# Patient Record
Sex: Female | Born: 1940 | Race: White | Hispanic: No | Marital: Single | Smoking: Former smoker
Health system: Southern US, Community
[De-identification: ages and names within clinical notes are randomized; demographics above are authoritative.]

## PROBLEM LIST (undated history)

## (undated) DIAGNOSIS — R011 Cardiac murmur, unspecified: Secondary | ICD-10-CM

## (undated) DIAGNOSIS — H269 Unspecified cataract: Secondary | ICD-10-CM

## (undated) DIAGNOSIS — D649 Anemia, unspecified: Secondary | ICD-10-CM

## (undated) DIAGNOSIS — M1990 Unspecified osteoarthritis, unspecified site: Secondary | ICD-10-CM

## (undated) DIAGNOSIS — R9431 Abnormal electrocardiogram [ECG] [EKG]: Secondary | ICD-10-CM

## (undated) DIAGNOSIS — I059 Rheumatic mitral valve disease, unspecified: Secondary | ICD-10-CM

## (undated) DIAGNOSIS — R5383 Other fatigue: Secondary | ICD-10-CM

## (undated) DIAGNOSIS — E559 Vitamin D deficiency, unspecified: Secondary | ICD-10-CM

## (undated) DIAGNOSIS — C50919 Malignant neoplasm of unspecified site of unspecified female breast: Secondary | ICD-10-CM

## (undated) HISTORY — DX: Unspecified osteoarthritis, unspecified site: M19.90

## (undated) HISTORY — DX: Cardiac murmur, unspecified: R01.1

## (undated) HISTORY — PX: CARDIAC VALVE REPLACEMENT: SHX585

## (undated) HISTORY — DX: Malignant neoplasm of unspecified site of unspecified female breast: C50.919

## (undated) HISTORY — DX: Unspecified cataract: H26.9

## (undated) HISTORY — DX: Anemia, unspecified: D64.9

## (undated) HISTORY — PX: MASTECTOMY: SHX3

## (undated) HISTORY — DX: Vitamin D deficiency, unspecified: E55.9

## (undated) HISTORY — PX: TUBAL LIGATION: SHX77

## (undated) HISTORY — DX: Other fatigue: R53.83

## (undated) HISTORY — PX: BREAST SURGERY: SHX581

## (undated) HISTORY — DX: Abnormal electrocardiogram (ECG) (EKG): R94.31

## (undated) HISTORY — DX: Rheumatic mitral valve disease, unspecified: I05.9

---

## 1964-05-21 HISTORY — PX: APPENDECTOMY: SHX54

## 1973-05-21 HISTORY — PX: VARICOSE VEIN SURGERY: SHX832

## 2009-01-19 ENCOUNTER — Ambulatory Visit (HOSPITAL_COMMUNITY): Admission: RE | Admit: 2009-01-19 | Discharge: 2009-01-19 | Payer: Self-pay | Admitting: Internal Medicine

## 2009-01-19 ENCOUNTER — Other Ambulatory Visit: Admission: RE | Admit: 2009-01-19 | Discharge: 2009-01-19 | Payer: Self-pay | Admitting: Internal Medicine

## 2009-02-24 ENCOUNTER — Ambulatory Visit: Payer: Self-pay | Admitting: Gastroenterology

## 2009-09-05 ENCOUNTER — Encounter (INDEPENDENT_AMBULATORY_CARE_PROVIDER_SITE_OTHER): Payer: Self-pay | Admitting: *Deleted

## 2009-09-23 ENCOUNTER — Encounter (INDEPENDENT_AMBULATORY_CARE_PROVIDER_SITE_OTHER): Payer: Self-pay | Admitting: *Deleted

## 2009-09-26 ENCOUNTER — Encounter: Payer: Self-pay | Admitting: Cardiology

## 2009-09-27 ENCOUNTER — Ambulatory Visit: Payer: Self-pay | Admitting: Gastroenterology

## 2009-10-07 ENCOUNTER — Ambulatory Visit (HOSPITAL_COMMUNITY): Admission: RE | Admit: 2009-10-07 | Discharge: 2009-10-07 | Payer: Self-pay | Admitting: Internal Medicine

## 2009-10-11 ENCOUNTER — Ambulatory Visit: Payer: Self-pay | Admitting: Cardiology

## 2009-10-11 DIAGNOSIS — I340 Nonrheumatic mitral (valve) insufficiency: Secondary | ICD-10-CM | POA: Insufficient documentation

## 2009-10-11 DIAGNOSIS — I059 Rheumatic mitral valve disease, unspecified: Secondary | ICD-10-CM

## 2009-10-12 ENCOUNTER — Encounter: Payer: Self-pay | Admitting: Cardiology

## 2009-10-12 ENCOUNTER — Ambulatory Visit: Payer: Self-pay

## 2009-10-12 ENCOUNTER — Ambulatory Visit (HOSPITAL_COMMUNITY): Admission: RE | Admit: 2009-10-12 | Discharge: 2009-10-12 | Payer: Self-pay | Admitting: Cardiology

## 2009-10-12 ENCOUNTER — Ambulatory Visit: Payer: Self-pay | Admitting: Internal Medicine

## 2009-12-05 ENCOUNTER — Ambulatory Visit (HOSPITAL_COMMUNITY): Admission: RE | Admit: 2009-12-05 | Discharge: 2009-12-05 | Payer: Self-pay | Admitting: Internal Medicine

## 2010-06-11 ENCOUNTER — Encounter: Payer: Self-pay | Admitting: Cardiology

## 2010-06-20 NOTE — Letter (Signed)
Summary: [facility] Instructions  [facility]  [address] [place], [place] [address]   Phone: [phone]  Fax: [phone]       Helen Davis    1940/10/16    MRN: [id number]        Procedure Day Dorna Bloom: [facility]  10/05/09     Arrival Time:  9:00am     Procedure Time: 10:00am     Location of Procedure:                    Juliann Pares _  [facility] (4th Floor)                        PREPARATION FOR COLONOSCOPY WITH MOVIPREP   Starting 5 days prior to your procedure  FRIDAY 05/13  do not eat nuts, seeds, popcorn, corn, beans, peas,  salads, or any raw vegetables.  Do not take any fiber supplements (e.g. Metamucil, Citrucel, and Benefiber).  THE DAY BEFORE YOUR PROCEDURE         DATE: 05/17   DAY: TUESDAY  1.  Drink clear liquids the entire day-NO SOLID FOOD  2.  Do not drink anything colored red or purple.  Avoid juices with pulp.  No orange juice.  3.  Drink at least 64 oz. (8 glasses) of fluid/clear liquids during the day to prevent dehydration and help the prep work efficiently.  CLEAR LIQUIDS INCLUDE: Water Jello Ice Popsicles Tea (sugar ok, no milk/cream) Powdered fruit flavored drinks Coffee (sugar ok, no milk/cream) Gatorade Juice: apple, white grape, white cranberry  Lemonade Clear bullion, consomm, broth Carbonated beverages (any kind) Strained chicken noodle soup Hard Candy                             4.  In the morning, mix first dose of MoviPrep solution:    Empty 1 Pouch A and 1 Pouch B into the disposable container    Add lukewarm drinking water to the top line of the container. Mix to dissolve    Refrigerate (mixed solution should be used within 24 hrs)  5.  Begin drinking the prep at 5:00 p.m. The MoviPrep container is divided by 4 marks.   Every 15 minutes drink the solution down to the next mark (approximately 8 oz) until the full liter is complete.   6.  Follow completed prep with 16 oz of clear liquid of your  choice (Nothing red or purple).  Continue to drink clear liquids until bedtime.  7.  Before going to bed, mix second dose of MoviPrep solution:    Empty 1 Pouch A and 1 Pouch B into the disposable container    Add lukewarm drinking water to the top line of the container. Mix to dissolve    Refrigerate  THE DAY OF YOUR PROCEDURE      DATE: 05/18  DAY: WEDNESDAY  Beginning at 5:00 a.m. (5 hours before procedure):         1. Every 15 minutes, drink the solution down to the next mark (approx 8 oz) until the full liter is complete.  2. Follow completed prep with 16 oz. of clear liquid of your choice.    3. You may drink clear liquids until 8:00am  (2 HOURS BEFORE PROCEDURE).   MEDICATION INSTRUCTIONS  Unless otherwise instructed, you should take regular prescription medications with a small sip of water   as early  as possible the morning of your procedure.      OTHER INSTRUCTIONS  You will need a responsible adult at least 68 years of age to accompany you and drive you home.   This person must remain in the waiting room during your procedure.  Wear loose fitting clothing that is easily removed.  Leave jewelry and other valuables at home.  However, you may wish to bring a book to read or  an iPod/MP3 player to listen to music as you wait for your procedure to start.  Remove all body piercing jewelry and leave at home.  Total time from sign-in until discharge is approximately 2-3 hours.  You should go home directly after your procedure and rest.  You can resume normal activities the  day after your procedure.  The day of your procedure you should not:   Drive   Make legal decisions   Operate machinery   Drink alcohol   Return to work  You will receive specific instructions about eating, activities and medications before you leave.    The above instructions have been reviewed and explained to me by   Wyona Almas RN  Sep 27, 2009 11:07 AM     I fully  understand and can verbalize these instructions _____________________________ Date _________

## 2010-06-20 NOTE — Assessment & Plan Note (Signed)
Summary: np6/clearance for colonoscopy/jml   Primary Provider:  Dr. Elisabeth Most  CC:  new patient.  Clearance for colonoscopy.  Abnormal EKG per Dr. Rock Nephew office.  Marland Kitchen  History of Present Illness: 70 yo with history of mitral valve prolapse and arthritis presents for evaluation of abnormal ECG and murmur.  Patient was noted on recent visit with her PCP to have some abnormalities on her ECG.  Additionally, she has a history of mitral valve prolapse (diagnosed when she lived in [place]).  She actually is doing quite well from a cardiopulmonary standpoint.  She had one episode of chest pain back in February.  This occurred after she had been moving furniture around her house all day.  She woke up that night with severe aching across her chest.  She eventually fell back asleep and the pain was only mild when she woke up in the morning.  No exertional chest pain.  No pain since that time.  Patient walks a lot for exercise.  She goes to the [facility] and uses the treadmill and exercise bike.  She lifts weight.  No exertional dyspnea except when she carries her grand-daughter up a flight of stairs.  She is supposed to have a colonoscopy this summer and wants to make sure that this will be safe from a cardiac perspective.    ECG: NSR, nonspecific inferior and lateral T wave flattening, inverted T in V6  Labs (5/11): HCT 36.6, K 4.2, creatinine 0.7, HDL 109, LDL 75, TSH normal   Current Medications (verified): 1)  Moviprep 100 Gm  Solr (Peg-Kcl-Nacl-Nasulf-Na Asc-C) .... As Per Prep Instructions. 2)  Advil 200 Mg Tabs (Ibuprofen) .... As Needed For Arthritis  Allergies (verified): No Known Drug Allergies  Past History:  Past Medical History: 1. MVP: She was told she had MVP with some mitral regurgitation when she lived in [place].  2. Breast cancer: Status post chemotherapy and radiation as well as bilateral mastectomies in 1999.  3. Arthritis: Some features of rheumatoid arthritis but has not  been definitively diagnosed.   Past Surgical History: bilateral mastectomy-1999 tonsillectomy appendectomy  Family History: Sister with mitral valve prolapse, required mitral valve repair. Mother with CVA, CHF, atrial fibrillation.   Social History: Lives alone, originally from [place].  2 sons live in [place].  Retired Child psychotherapist.  Drinks about 2 glasses of wine a night.  No smoking.   Review of Systems       All systems reviewed and negative except as per HPI.   Vital Signs:  Patient profile:   70 year old female Height:      64.5 inches Weight:      108 pounds BMI:     18.32 Pulse rate:   75 / minute Pulse rhythm:   regular BP sitting:   132 / 60  (left arm) Cuff size:   regular  Vitals Entered By: Judithe Modest CMA (Oct 11, 2009 12:01 PM)  Physical Exam  General:  Well developed, well nourished, in no acute distress. Thin.  Head:  normocephalic and atraumatic Nose:  no deformity, discharge, inflammation, or lesions Mouth:  Teeth, gums and palate normal. Oral mucosa normal. Neck:  Neck supple, no JVD. No masses, thyromegaly or abnormal cervical nodes. Lungs:  Clear bilaterally to auscultation and percussion. Heart:  Non-displaced PMI, chest non-tender; regular rate and rhythm, S1, S2 without rubs or gallops. 3/6 mid to late systolic murmur at the apex.  Carotid upstroke normal, no bruit. Pedals normal pulses. No edema, no varicosities.  Abdomen:  Bowel sounds positive; abdomen soft and non-tender without masses, organomegaly, or hernias noted. No hepatosplenomegaly. Msk:  Ulnar deviation of the fingers bilaterally.  Extremities:  No clubbing or cyanosis. Neurologic:  Alert and oriented x 3. Skin:  Intact without lesions or rashes. Psych:  Normal affect.   Impression & Recommendations:  Problem # 1:  MITRAL VALVE DISORDERS (ICD-424.0) Patient has a mid to late systolic murmur consistent with mitral valve prolapse with mitral regurgitation.  Will get an  echocardiogram to determine severity of the regurgitation.  Patient has no significant cardiopulmonary symptoms.   Problem # 2:  ABNORMAL EKG (ICD-794.31) Nonspecific T wave abnormalities on ECG.  Given patient's lack of significant symptoms, I do not think that a stress test would be indicated here.  I think that she should be able to safely undergo colonoscopy.    Other Orders: Echocardiogram (Echo)  Patient Instructions: 1)  Your physician has requested that you have an echocardiogram.  Echocardiography is a painless test that uses sound waves to create images of your heart. It provides your doctor with information about the size and shape of your heart and how well your heart's chambers and valves are working.  This procedure takes approximately one hour. There are no restrictions for this procedure. 2)  Your physician recommends that you schedule a follow-up appointment as needed with Dr Shirlee Latch.

## 2010-06-20 NOTE — Miscellaneous (Signed)
Summary: [facility] Previsit/prep  Clinical Lists Changes  Medications: Added new medication of MOVIPREP 100 GM  SOLR (PEG-KCL-NACL-NASULF-NA ASC-C) As per prep instructions. - Signed Rx of MOVIPREP 100 GM  SOLR (PEG-KCL-NACL-NASULF-NA ASC-C) As per prep instructions.;  #1 x 0;  Signed;  Entered by: Wyona Almas RN;  Authorized by: Rachael Fee MD;  Method used: Electronically to [facility] [address], [place], [place], [place]  [address], Ph: [phone], Fax: [phone] Observations: Added new observation of NKA: T (09/27/2009 10:25)    Prescriptions: MOVIPREP 100 GM  SOLR (PEG-KCL-NACL-NASULF-NA ASC-C) As per prep instructions.  #1 x 0   Entered by:   Wyona Almas RN   Authorized by:   Rachael Fee MD   Signed by:   Wyona Almas RN on 09/27/2009   Method used:   Electronically to        [facility] [address] (retail)       [address]       [place], [place]  [address]       Ph: [phone]       Fax: [phone]   RxID:   [id number]   Appended Document: [facility] Previsit/prep Pt. states she had an EKG yesterday at her PCP and Dr. Elisabeth Most thought she may have had an MI and has referred her to a cardiologist for evaluation.  I advised Ms. Lundstrom that she needed  have the cardiac workup prior to her colonoscopy.  Pt. verbalized information.

## 2010-06-20 NOTE — Letter (Signed)
Summary: Previsit letter  [facility]  [address] [place], [place] [address]   Phone: [phone]  Fax: [phone]       09/05/2009 MRN: [id number]  [facility] [address] [place], [place]  [address]  Dear Helen Davis,  Welcome to the Gastroenterology Division at [facility].    You are scheduled to see a nurse for your pre-procedure visit on 09-27-09 at 10:30a.m. on the 3rd floor at [facility], [address]  We ask that you try to arrive at our office 15 minutes prior to your appointment time to allow for check-in.  Your nurse visit will consist of discussing your medical and surgical history, your immediate family medical history, and your medications.    Please bring a complete list of all your medications or, if you prefer, bring the medication bottles and we will list them.  We will need to be aware of both prescribed and over the counter drugs.  We will need to know exact dosage information as well.  If you are on blood thinners (Coumadin, Plavix, Aggrenox, Ticlid, etc.) please call our office today/prior to your appointment, as we need to consult with your physician about holding your medication.   Please be prepared to read and sign documents such as consent forms, a financial agreement, and acknowledgement forms.  If necessary, and with your consent, a friend or relative is welcome to sit-in on the nurse visit with you.  Please bring your insurance card so that we may make a copy of it.  If your insurance requires a referral to see a specialist, please bring your referral form from your primary care physician.  No co-pay is required for this nurse visit.     If you cannot keep your appointment, please call [phone] to cancel or reschedule prior to your appointment date.  This allows [place] the opportunity to schedule an appointment for another patient in need of care.    Thank you for choosing [facility] for your  medical needs.  We appreciate the opportunity to care for you.  Please visit [place] at our website  to learn more about our practice.                     Sincerely.                                                                                                                   The Gastroenterology Division

## 2011-10-18 ENCOUNTER — Other Ambulatory Visit (HOSPITAL_COMMUNITY)
Admission: RE | Admit: 2011-10-18 | Discharge: 2011-10-18 | Disposition: A | Discharge status: Home / Self Care | Payer: Medicare Other | Source: Ambulatory Visit | Attending: Internal Medicine | Admitting: Internal Medicine

## 2011-10-18 DIAGNOSIS — Z124 Encounter for screening for malignant neoplasm of cervix: Secondary | ICD-10-CM | POA: Insufficient documentation

## 2011-12-13 ENCOUNTER — Ambulatory Visit (HOSPITAL_COMMUNITY)
Admission: RE | Admit: 2011-12-13 | Discharge: 2011-12-13 | Disposition: A | Discharge status: Home / Self Care | Payer: Medicare Other | Source: Ambulatory Visit | Attending: Emergency Medicine | Admitting: Emergency Medicine

## 2011-12-13 ENCOUNTER — Other Ambulatory Visit (HOSPITAL_COMMUNITY): Payer: Self-pay | Admitting: Emergency Medicine

## 2011-12-13 DIAGNOSIS — J449 Chronic obstructive pulmonary disease, unspecified: Secondary | ICD-10-CM

## 2011-12-13 DIAGNOSIS — J4489 Other specified chronic obstructive pulmonary disease: Secondary | ICD-10-CM | POA: Insufficient documentation

## 2011-12-13 DIAGNOSIS — Z0000 Encounter for general adult medical examination without abnormal findings: Secondary | ICD-10-CM | POA: Insufficient documentation

## 2012-12-18 ENCOUNTER — Encounter: Payer: Self-pay | Admitting: Cardiology

## 2012-12-18 ENCOUNTER — Encounter: Payer: Self-pay | Admitting: *Deleted

## 2012-12-22 ENCOUNTER — Encounter: Payer: Self-pay | Admitting: *Deleted

## 2012-12-22 ENCOUNTER — Encounter: Payer: Medicare Other | Admitting: Cardiology

## 2012-12-22 DIAGNOSIS — D649 Anemia, unspecified: Secondary | ICD-10-CM | POA: Insufficient documentation

## 2012-12-22 DIAGNOSIS — E559 Vitamin D deficiency, unspecified: Secondary | ICD-10-CM | POA: Insufficient documentation

## 2012-12-22 DIAGNOSIS — Z853 Personal history of malignant neoplasm of breast: Secondary | ICD-10-CM | POA: Insufficient documentation

## 2012-12-22 NOTE — Progress Notes (Signed)
  HPI: 72 year old female for evaluation of mitral valve prolapse and mitral regurgitation. Patient previously seen by Dr. Shirlee Latch in May of 2011. Echocardiogram in May of 2011 revealed normal LV function, mitral valve prolapse and moderate mitral regurgitation.  No current outpatient prescriptions on file.   No current facility-administered medications for this visit.    Allergies not on file  Past Medical History  Diagnosis Date  . Mitral valve disorders   . ABNORMAL EKG     No past surgical history on file.  History   Social History  . Marital Status: Single    Spouse Name: N/A    Number of Children: N/A  . Years of Education: N/A   Occupational History  . Not on file.   Social History Main Topics  . Smoking status: Not on file  . Smokeless tobacco: Not on file  . Alcohol Use: Not on file  . Drug Use: Not on file  . Sexually Active: Not on file   Other Topics Concern  . Not on file   Social History Narrative  . No narrative on file    No family history on file.  ROS: no fevers or chills, productive cough, hemoptysis, dysphasia, odynophagia, melena, hematochezia, dysuria, hematuria, rash, seizure activity, orthopnea, PND, pedal edema, claudication. Remaining systems are negative.  Physical Exam:   There were no vitals taken for this visit.  General:  Well developed/well nourished in NAD Skin warm/dry Patient not depressed No peripheral clubbing Back-normal HEENT-normal/normal eyelids Neck supple/normal carotid upstroke bilaterally; no bruits; no JVD; no thyromegaly chest - CTA/ normal expansion CV - RRR/normal S1 and S2; no murmurs, rubs or gallops;  PMI nondisplaced Abdomen -NT/ND, no HSM, no mass, + bowel sounds, no bruit 2+ femoral pulses, no bruits Ext-no edema, chords, 2+ DP Neuro-grossly nonfocal  ECG    This encounter was created in error - please disregard.

## 2013-06-02 ENCOUNTER — Encounter: Payer: Self-pay | Admitting: Physician Assistant

## 2013-06-02 ENCOUNTER — Ambulatory Visit (INDEPENDENT_AMBULATORY_CARE_PROVIDER_SITE_OTHER): Payer: Medicare Other | Admitting: Physician Assistant

## 2013-06-02 VITALS — BP 132/72 | HR 76 | Temp 98.2°F | Resp 16 | Ht 63.0 in | Wt 103.0 lb

## 2013-06-02 DIAGNOSIS — J019 Acute sinusitis, unspecified: Secondary | ICD-10-CM

## 2013-06-02 MED ORDER — PREDNISONE 20 MG PO TABS: ORAL_TABLET | ORAL | Status: DC

## 2013-06-02 MED ORDER — AZITHROMYCIN 250 MG PO TABS: ORAL_TABLET | ORAL | Status: DC

## 2013-06-02 NOTE — Patient Instructions (Addendum)

## 2013-06-02 NOTE — Progress Notes (Addendum)
   Subjective:    Patient ID: Helen Davis, female    DOB: 07-30-1940, 73 y.o.   MRN: [id number]  Sinus Problem This is a new problem. The current episode started in the past 7 days. The problem is unchanged. There has been no fever. Associated symptoms include chills, congestion, coughing, headaches, sinus pressure and sneezing. Pertinent negatives include no diaphoresis, ear pain, hoarse voice, neck pain, shortness of breath, sore throat or swollen glands. Past treatments include nothing.    Review of Systems  Constitutional: Positive for chills. Negative for diaphoresis.  HENT: Positive for congestion, sinus pressure and sneezing. Negative for ear pain, hoarse voice and sore throat.   Respiratory: Positive for cough. Negative for shortness of breath.   Gastrointestinal: Negative.   Musculoskeletal: Negative for neck pain.  Neurological: Positive for headaches.       Objective:   Physical Exam  Constitutional: She appears well-developed and well-nourished.  HENT:  Head: Normocephalic and atraumatic.  Right Ear: External ear normal.  Nose: Right sinus exhibits frontal sinus tenderness. Left sinus exhibits frontal sinus tenderness.  Eyes: Conjunctivae and EOM are normal.  Neck: Normal range of motion. Neck supple.  Cardiovascular: Normal rate, regular rhythm, normal heart sounds and intact distal pulses.   Pulmonary/Chest: Effort normal and breath sounds normal. No respiratory distress. She has no wheezes.  Abdominal: Soft. Bowel sounds are normal.  Lymphadenopathy:    She has cervical adenopathy.  Skin: Skin is warm and dry.      Assessment & Plan:  Acute sinusitis, unspecified - Plan: azithromycin (ZITHROMAX) 250 MG tablet, predniSONE (DELTASONE) 20 MG tablet

## 2013-11-12 ENCOUNTER — Encounter: Payer: Self-pay | Admitting: Emergency Medicine

## 2013-11-12 ENCOUNTER — Ambulatory Visit (INDEPENDENT_AMBULATORY_CARE_PROVIDER_SITE_OTHER): Payer: Medicare Other | Admitting: Emergency Medicine

## 2013-11-12 ENCOUNTER — Other Ambulatory Visit: Payer: Self-pay | Admitting: Emergency Medicine

## 2013-11-12 VITALS — BP 114/68 | HR 68 | Temp 98.4°F | Resp 18 | Ht 63.75 in | Wt 100.0 lb

## 2013-11-12 DIAGNOSIS — Z789 Other specified health status: Secondary | ICD-10-CM

## 2013-11-12 DIAGNOSIS — Z0000 Encounter for general adult medical examination without abnormal findings: Secondary | ICD-10-CM

## 2013-11-12 DIAGNOSIS — E782 Mixed hyperlipidemia: Secondary | ICD-10-CM

## 2013-11-12 DIAGNOSIS — Z1331 Encounter for screening for depression: Secondary | ICD-10-CM

## 2013-11-12 DIAGNOSIS — R5383 Other fatigue: Secondary | ICD-10-CM

## 2013-11-12 DIAGNOSIS — R5381 Other malaise: Secondary | ICD-10-CM

## 2013-11-12 DIAGNOSIS — I059 Rheumatic mitral valve disease, unspecified: Secondary | ICD-10-CM

## 2013-11-12 DIAGNOSIS — E559 Vitamin D deficiency, unspecified: Secondary | ICD-10-CM

## 2013-11-12 LAB — BASIC METABOLIC PANEL WITH GFR
BUN: 9 mg/dL (ref 6–23)
CALCIUM: 9.1 mg/dL (ref 8.4–10.5)
CO2: 27 meq/L (ref 19–32)
Chloride: 96 mEq/L (ref 96–112)
Creat: 0.59 mg/dL (ref 0.50–1.10)
GFR, Est African American: >89 mL/min
Glucose, Bld: 85 mg/dL (ref 70–99)
POTASSIUM: 3.9 meq/L (ref 3.5–5.3)
Sodium: 130 mEq/L — ABNORMAL LOW (ref 135–145)

## 2013-11-12 LAB — CBC WITH DIFFERENTIAL/PLATELET
BASOS ABS: 0.1 10*3/uL (ref 0.0–0.1)
Basophils Relative: 2 % — ABNORMAL HIGH (ref 0–1)
EOS ABS: 0.1 10*3/uL (ref 0.0–0.7)
Eosinophils Relative: 3 % (ref 0–5)
HEMATOCRIT: 33.4 % — AB (ref 36.0–46.0)
HEMOGLOBIN: 11.2 g/dL — AB (ref 12.0–15.0)
LYMPHS PCT: 30 % (ref 12–46)
Lymphs Abs: 1.4 10*3/uL (ref 0.7–4.0)
MCH: 30.7 pg (ref 26.0–34.0)
MCHC: 33.5 g/dL (ref 30.0–36.0)
MCV: 91.5 fL (ref 78.0–100.0)
MONOS PCT: 10 % (ref 3–12)
Monocytes Absolute: 0.5 10*3/uL (ref 0.1–1.0)
NEUTROS ABS: 2.5 10*3/uL (ref 1.7–7.7)
Neutrophils Relative %: 55 % (ref 43–77)
PLATELETS: 274 10*3/uL (ref 150–400)
RBC: 3.65 MIL/uL — ABNORMAL LOW (ref 3.87–5.11)
RDW: 12.9 % (ref 11.5–15.5)
WBC: 4.6 10*3/uL (ref 4.0–10.5)

## 2013-11-12 LAB — LIPID PANEL
Cholesterol: 208 mg/dL — ABNORMAL HIGH (ref 0–200)
HDL: 100 mg/dL (ref >39)
LDL CALC: 92 mg/dL (ref 0–99)
Total CHOL/HDL Ratio: 2.1 Ratio
Triglycerides: 82 mg/dL (ref <150)
VLDL: 16 mg/dL (ref 0–40)

## 2013-11-12 LAB — HEPATIC FUNCTION PANEL
ALK PHOS: 59 U/L (ref 39–117)
ALT: 14 U/L (ref 0–35)
AST: 17 U/L (ref 0–37)
Albumin: 4.3 g/dL (ref 3.5–5.2)
Bilirubin, Direct: 0.1 mg/dL (ref 0.0–0.3)
Indirect Bilirubin: 0.6 mg/dL (ref 0.2–1.2)
TOTAL PROTEIN: 6.2 g/dL (ref 6.0–8.3)
Total Bilirubin: 0.7 mg/dL (ref 0.2–1.2)

## 2013-11-12 LAB — MAGNESIUM: MAGNESIUM: 1.9 mg/dL (ref 1.5–2.5)

## 2013-11-12 LAB — HEMOGLOBIN A1C
HEMOGLOBIN A1C: 5.2 % (ref <5.7)
Mean Plasma Glucose: 103 mg/dL (ref <117)

## 2013-11-12 NOTE — Patient Instructions (Signed)
Cholesterol Cholesterol is a protein. Your body needs a small amount of cholesterol. Cholesterol may build up in your blood vessels. This increases your chance of having a heart attack or stroke. You cannot feel your cholesterol levels. The only way to know your cholesterol level is high is with a blood test. Keep your test results. Work with your doctor to keep your cholesterol at a good level. WHAT DO THE TEST RESULTS MEAN?  Total cholesterol is how much cholesterol is in your blood.  LDL is bad cholesterol. This is the type that can build up. You want LDL to be low.  HDL is good cholesterol. It cleans your blood vessels and carries LDL away. You want HDL to be high.  Triglycerides are fat that the body can burn for energy or store. WHAT ARE GOOD LEVELS OF CHOLESTEROL?  Total cholesterol below 200.  LDL below 100 for people at risk. Below 70 for those at very high risk.  HDL above 50 is good. Above 60 is best.  Triglycerides below 150. HOW CAN I LOWER MY CHOLESTEROL?  Diet. Follow your diet programs as told by your doctor.  Choose fish, white meat chicken, roasted [place], or baked [place]. Try not to eat red meat, fried foods, or processed meats such as sausage and lunch meats.  Eat lots of fresh fruits and vegetables.  Choose whole grains, beans, pasta, potatoes, and cereals.  Use only small amounts of olive, corn, or canola oils.  Try not to eat butter, mayonnaise, shortening, or palm kernel oils.  Try not to eat foods with trans fats.  Drink skim or nonfat milk. Eat low-fat or nonfat yogurt and cheeses. Try not to drink whole milk or cream. Try not to eat ice cream, egg yolks, and full-fat cheeses.  Healthy desserts include angel food cake, ginger snaps, animal crackers, hard candy, popsicles, and low-fat or nonfat frozen yogurt. Try not to eat pastries, cakes, pies, and cookies.  Exercise. Follow your exercise programs as told by your doctor.  Be more active. You can  try gardening, walking, or taking the stairs. Ask your doctor about how you can be more active.  Medicine. Take medicine as told by your doctor. Document Released: 08/03/2008 Document Revised: 05/12/2013 Document Reviewed: 02/18/2013 [facility] Patient Information 2015 [place], [place]. This information is not intended to replace advice given to you by your health care Srishti Strnad. Make sure you discuss any questions you have with your health care Jadee Golebiewski.

## 2013-11-12 NOTE — Progress Notes (Signed)
Patient ID: Helen Davis, female   DOB: [birth year], 73 y.o.   MRN: [id number] MEDICARE ANNUAL WELLNESS VISIT AND CPE  Assessment:  1. CPE/ medicare wellness update- Update screening labs/ History/ Immunizations/ Testing as needed. Advised healthy diet, QD exercise, increase H20 and continue RX/ Vitamins AD.  2. Fatigue- check labs, increase activity and H2O   3. Cholesterol (diet controlled)/ Vit d.def- recheck labs, Need to eat healthier and exercise AD.   4. Callus/ Bunion/ HAMMER TOESS- Advised needs podiatry evaluation, epsom salt soaks, vaseline treatment QD, monitor QD and call with any concerns/ adverse changes  Plan:   During the course of the visit the patient was educated and counseled about appropriate screening and preventive services including:    Pneumococcal vaccine   Influenza vaccine  Td vaccine  Screening electrocardiogram  Screening mammography  Bone densitometry screening  Colorectal cancer screening  Diabetes screening  Glaucoma screening  Nutrition counseling   Advanced directives: given information/requested  Screening recommendations, referrals:  Vaccinations: Tdap vaccine not indicated Influenza vaccine declined Pneumococcal vaccine declined Shingles vaccine declined Hep B vaccine declined  Nutrition assessed and recommended  Colonoscopy not indicated Mammogram declined Pap smear not indicated Pelvic exam not indicated Recommended yearly ophthalmology/optometry visit for glaucoma screening and checkup Recommended yearly dental visit for hygiene and checkup Advanced directives - not indicated  Conditions/risks identified: BMI: Discussed weight loss, diet, and increase physical activity.  Increase physical activity: AHA recommends 150 minutes of physical activity a week.  Medications reviewed DEXA- declined Diabetes at goal, ACE/ARB therapy No, Reason not on Ace Inhibitor/ARB therapy:  NOT diabetic Urinary Incontinence is not an  issue: discussed non pharmacology and pharmacology options.  Fall risk: low- discussed PT, home fall assessment, medications.   Subjective:   Helen Davis is a 73 y.o. female who presents for Medicare Annual Wellness Visit and complete physical.    Date of last medicare wellness visit is unknown.  She did not f/u AD for low sodium 10/28/12 130 and chloride 94.   Her blood pressure has been controlled at home, today their BP is BP: 114/68 mmHg She does workout. She denies chest pain, shortness of breath, dizziness.  She is not on cholesterol medication and denies myalgias. Her cholesterol is at goal. The cholesterol last visit was:   Lab Results  Component Value Date   CHOL 208* 11/12/2013  T 214 TG 74 H 119 L80  She has been working on diet and exercise TO PREVENT prediabetes, and denies foot ulcerations, paresthesia of the feet and polyuria. Last A1C in the office was: 5.1 Lab Results  Component Value Date   HGBA1C 5.2 11/12/2013   Patient is on Vitamin D supplement.     Names of Other Physician/Practitioners you currently use: Patient Care Team: Unk Pinto, MD as PCP - General (Internal Medicine) Darleen Crocker, MD as Consulting Physician (Ophthalmology) Mayme Genta, MD as Consulting Physician (Gastroenterology) Simona Huh, MD as Consulting Physician (Dermatology) Garald Balding, MD as Consulting Physician (Orthopedic Surgery) Thyra Breed, MD as Consulting Physician (Rheumatology) Victorino Dike, DDS as Consulting Physician (Dentistry) Marigene Ehlers, (Cardio)   Medication Review Current Outpatient Prescriptions on File Prior to Visit  Medication Sig Dispense Refill  . Ascorbic Acid (VITAMIN C) 1000 MG tablet Take 1,000 mg by mouth daily.      . Cholecalciferol (VITAMIN D PO) Take 2,000 Int'l Units by mouth daily.      Marland Kitchen CRANBERRY PO Take 450 mg by mouth daily.       Marland Kitchen  Cyanocobalamin (VITAMIN B12 PO) Take by mouth daily.      Marland Kitchen MAGNESIUM  PO Take 250 mg by mouth daily.       . Multiple Vitamins-Minerals (ZINC PO) Take by mouth daily.       No current facility-administered medications on file prior to visit.    Current Problems (verified) Patient Active Problem List   Diagnosis Date Noted  . Anemia   . Breast cancer   . Vitamin D deficiency   . Fatigue   . MITRAL VALVE DISORDERS 10/11/2009  . ABNORMAL EKG 10/11/2009    Screening Tests Health Maintenance  Topic Date Due  . Tetanus/tdap  02/12/1962  . Mammogram  02/12/1993  . Colonoscopy  02/12/1993  . Zostavax  02/13/2003  . Pneumococcal Polysaccharide Vaccine Age 37 And Over  02/13/2008  . Influenza Vaccine  12/19/2013     There is no immunization history on file for this patient.  Preventative care: Last colonoscopy: 2013 Last mammogram: 1999 declines further Last pap smear/pelvic exam: 2013 declines 2016 DEXA:2013 osteoporosis, Fosamax in past declines repeat eval EYE: 03/2013 stable Dentist: Q 6 month  Prior vaccinations: TD or Tdap: 2011  Influenza: declines Pneumococcal: declines Shingles/Zostavax: declines  Past Medical History  Diagnosis Date  . Mitral valve disorders   . ABNORMAL EKG   . Anemia   . Breast cancer   . Vitamin D deficiency   . Fatigue    Past Surgical History  Procedure Laterality Date  . Mastectomy     History  Substance Use Topics  . Smoking status: Former Smoker    Quit date: 05/21/1972  . Smokeless tobacco: Not on file  . Alcohol Use: Not on file   Family History  Problem Relation Age of Onset  . Hypertension    . Stroke    . Cancer      Risk Factors: Osteoporosis: postmenopausal estrogen deficiency History of fracture in the past year: no  Tobacco History  Substance Use Topics  . Smoking status: Former Smoker    Quit date: 05/21/1972  . Smokeless tobacco: Not on file  . Alcohol Use: Not on file   She does not smoke.  Patient is a former smoker. Are there smokers in your home (other than  you)?  No  Alcohol Current alcohol use: glass of wine with dinner  Caffeine Current caffeine use: coffee 4 /day  Exercise  Current exercise: gardening, housecleaning, walking and yard work  Nutrition/Diet Current diet: in general, a "healthy" diet    Cardiac risk factors: advanced age (older than 10 for men, 62 for women).  Depression Screen (Note: if answer to either of the following is "Yes", a more complete depression screening is indicated)   Q1: Over the past two weeks, have you felt down, depressed or hopeless? No  Q2: Over the past two weeks, have you felt little interest or pleasure in doing things? No  Have you lost interest or pleasure in daily life? No  Do you often feel hopeless? No  Do you cry easily over simple problems? No  Activities of Daily Living In your present state of health, do you have any difficulty performing the following activities?:  Driving? No Managing money?  No Feeding yourself? No Getting from bed to chair? No Climbing a flight of stairs? No Preparing food and eating?: No Bathing or showering? No Getting dressed: No Getting to the toilet? No Using the toilet:No Moving around from place to place: No In the past year have you  fallen or had a near fall?:No   Are you sexually active?  No  Do you have more than one partner?  No  Vision Difficulties: No  Hearing Difficulties: No Do you often ask people to speak up or repeat themselves? No Do you experience ringing or noises in your ears? No Do you have difficulty understanding soft or whispered voices? No  Cognition  Do you feel that you have a problem with memory?No  Do you often misplace items? No  Do you feel safe at home?  Yes  Advanced directives Does patient have a [place]? Yes, SON- Adrienne Mocha Does patient have a Living Will? Yes   Objective:     Blood pressure 114/68, pulse 68, temperature 98.4 F (36.9 C), temperature source Temporal, resp.  rate 18, height 5' 3.75" (1.619 m), weight 100 lb (45.36 kg). Body mass index is 17.31 kg/(m^2).  General appearance: alert, no distress, WD/WN,  female Cognitive Testing  Alert? Yes  Normal Appearance?Yes  Oriented to person? No  Place? No   Time? Yes  Recall of three objects?  Yes  Can perform simple calculations? Yes  Displays appropriate judgment?Yes  Can read the correct time from a watch face?Yes  HEENT: normocephalic, sclerae anicteric, TMs pearly, nares patent, no discharge or erythema, pharynx normal Oral cavity: MMM, no lesions Neck: supple, no lymphadenopathy, no thyromegaly, no masses Heart: RRR, normal S1, S2, 3/6 stable murmurs, Varicose veins R>L LE Lungs: CTA bilaterally, no wheezes, rhonchi, or rales Abdomen: +bs, soft, non tender, non distended, no masses, no hepatomegaly, no splenomegaly Musculoskeletal: nontender, no swelling, no obvious deformity except hammer toes bilateral feet (wears gel protectors to prevent breakdown between toes) Extremities: no edema, no cyanosis, no clubbing Pulses: 2+ symmetric, upper and lower extremities, normal cap refill Neurological: alert, oriented x 3, CN2-12 intact, strength normal upper extremities and lower extremities, sensation normal throughout, DTRs 2+ throughout, no cerebellar signs, gait normal Skin: WNL, mild fry edges of feet with calluses at great toes. Psychiatric: normal affect, behavior normal, pleasant  Breast:  nontender, no masses or lumps, no skin changes, no axillary lymphadenopathy with bi;lateral mastectomy Gyn: defer  Rectal: defer  AORTA SCAN WNL EKG NSCSPT   Medicare Attestation I have personally reviewed: The patient's medical and social history Their use of alcohol, tobacco or illicit drugs Their current medications and supplements The patient's functional ability including ADLs,fall risks, home safety risks, cognitive, and hearing and visual impairment Diet and physical activities Evidence for  depression or mood disorders  The patient's weight, height, BMI, and visual acuity have been recorded in the chart.  I have made referrals, counseling, and provided education to the patient based on review of the above and I have provided the patient with a written personalized care plan for preventive services.     Kelby Aline, R, PA-C   11/16/2013

## 2013-11-13 LAB — URINALYSIS, ROUTINE W REFLEX MICROSCOPIC
BILIRUBIN URINE: NEGATIVE
GLUCOSE, UA: NEGATIVE mg/dL
Hgb urine dipstick: NEGATIVE
KETONES UR: NEGATIVE mg/dL
LEUKOCYTES UA: NEGATIVE
NITRITE: NEGATIVE
PH: 6 (ref 5.0–8.0)
PROTEIN: NEGATIVE mg/dL
Specific Gravity, Urine: 1.006 (ref 1.005–1.030)
UROBILINOGEN UA: 0.2 mg/dL (ref 0.0–1.0)

## 2013-11-13 LAB — VITAMIN B12: Vitamin B-12: 469 pg/mL (ref 211–911)

## 2013-11-13 LAB — IRON AND TIBC
%SAT: 29 % (ref 20–55)
Iron: 103 ug/dL (ref 42–145)
TIBC: 355 ug/dL (ref 250–470)
UIBC: 252 ug/dL (ref 125–400)

## 2013-11-13 LAB — TSH: TSH: 0.71 u[IU]/mL (ref 0.350–4.500)

## 2013-11-13 LAB — VITAMIN D 25 HYDROXY (VIT D DEFICIENCY, FRACTURES): Vit D, 25-Hydroxy: 51 ng/mL (ref 30–89)

## 2013-11-16 ENCOUNTER — Telehealth: Payer: Self-pay

## 2013-11-16 NOTE — Telephone Encounter (Signed)
Left message for patient to return my call for lab results. 

## 2013-11-16 NOTE — Telephone Encounter (Signed)
Message copied by Nadyne Coombes on Mon Nov 16, 2013  9:16 AM ------      Message from: [place], [place] R      Created: Sun Nov 15, 2013  3:06 PM       Magnesium low add 250 mg, may help with aches, constipation, vitamin D absorption. Sodium mild low, increase intake.  Cholesterol is not in range, need to eat healthy diet, exercise daily. Recheck 6 months ------

## 2013-11-16 NOTE — Telephone Encounter (Signed)
Message copied by Nadyne Coombes on Mon Nov 16, 2013  9:17 AM ------      Message from: [place], [place] R      Created: Sun Nov 15, 2013  3:00 PM       Both NL ------

## 2014-05-24 ENCOUNTER — Ambulatory Visit: Payer: Self-pay | Admitting: Physician Assistant

## 2014-11-15 ENCOUNTER — Encounter: Payer: Self-pay | Admitting: Physician Assistant

## 2014-11-15 ENCOUNTER — Other Ambulatory Visit: Payer: Self-pay | Admitting: Physician Assistant

## 2014-11-15 ENCOUNTER — Ambulatory Visit (INDEPENDENT_AMBULATORY_CARE_PROVIDER_SITE_OTHER): Payer: Medicare Other | Admitting: Physician Assistant

## 2014-11-15 VITALS — BP 110/68 | HR 64 | Temp 98.2°F | Resp 16 | Ht 63.75 in | Wt 95.0 lb

## 2014-11-15 DIAGNOSIS — R6889 Other general symptoms and signs: Secondary | ICD-10-CM

## 2014-11-15 DIAGNOSIS — D649 Anemia, unspecified: Secondary | ICD-10-CM

## 2014-11-15 DIAGNOSIS — Z9181 History of falling: Secondary | ICD-10-CM

## 2014-11-15 DIAGNOSIS — M810 Age-related osteoporosis without current pathological fracture: Secondary | ICD-10-CM

## 2014-11-15 DIAGNOSIS — Z79899 Other long term (current) drug therapy: Secondary | ICD-10-CM

## 2014-11-15 DIAGNOSIS — C50919 Malignant neoplasm of unspecified site of unspecified female breast: Secondary | ICD-10-CM

## 2014-11-15 DIAGNOSIS — E559 Vitamin D deficiency, unspecified: Secondary | ICD-10-CM

## 2014-11-15 DIAGNOSIS — Z0001 Encounter for general adult medical examination with abnormal findings: Secondary | ICD-10-CM

## 2014-11-15 DIAGNOSIS — I059 Rheumatic mitral valve disease, unspecified: Secondary | ICD-10-CM

## 2014-11-15 DIAGNOSIS — Z0000 Encounter for general adult medical examination without abnormal findings: Secondary | ICD-10-CM

## 2014-11-15 DIAGNOSIS — E782 Mixed hyperlipidemia: Secondary | ICD-10-CM

## 2014-11-15 DIAGNOSIS — Z1331 Encounter for screening for depression: Secondary | ICD-10-CM

## 2014-11-15 LAB — CBC WITH DIFFERENTIAL/PLATELET
BASOS PCT: 1 % (ref 0–1)
Basophils Absolute: 0 10*3/uL (ref 0.0–0.1)
EOS ABS: 0 10*3/uL (ref 0.0–0.7)
EOS PCT: 1 % (ref 0–5)
HEMATOCRIT: 34.4 % — AB (ref 36.0–46.0)
HEMOGLOBIN: 11.7 g/dL — AB (ref 12.0–15.0)
LYMPHS ABS: 1.4 10*3/uL (ref 0.7–4.0)
LYMPHS PCT: 29 % (ref 12–46)
MCH: 31.5 pg (ref 26.0–34.0)
MCHC: 34 g/dL (ref 30.0–36.0)
MCV: 92.5 fL (ref 78.0–100.0)
MONOS PCT: 10 % (ref 3–12)
MPV: 9.9 fL (ref 8.6–12.4)
Monocytes Absolute: 0.5 10*3/uL (ref 0.1–1.0)
NEUTROS ABS: 2.9 10*3/uL (ref 1.7–7.7)
NEUTROS PCT: 59 % (ref 43–77)
Platelets: 268 10*3/uL (ref 150–400)
RBC: 3.72 MIL/uL — ABNORMAL LOW (ref 3.87–5.11)
RDW: 13 % (ref 11.5–15.5)
WBC: 4.9 10*3/uL (ref 4.0–10.5)

## 2014-11-15 NOTE — Patient Instructions (Signed)
Preventive Care for Adults A healthy lifestyle and preventive care can promote health and wellness. Preventive health guidelines for women include the following key practices.  A routine yearly physical is a good way to check with your health care provider about your health and preventive screening. It is a chance to share any concerns and updates on your health and to receive a thorough exam.  Visit your dentist for a routine exam and preventive care every 6 months. Brush your teeth twice a day and floss once a day. Good oral hygiene prevents tooth decay and gum disease.  The frequency of eye exams is based on your age, health, family medical history, use of contact lenses, and other factors. Follow your health care provider's recommendations for frequency of eye exams.  Eat a healthy diet. Foods like vegetables, fruits, whole grains, low-fat dairy products, and lean protein foods contain the nutrients you need without too many calories. Decrease your intake of foods high in solid fats, added sugars, and salt. Eat the right amount of calories for you.Get information about a proper diet from your health care provider, if necessary.  Regular physical exercise is one of the most important things you can do for your health. Most adults should get at least 150 minutes of moderate-intensity exercise (any activity that increases your heart rate and causes you to sweat) each week. In addition, most adults need muscle-strengthening exercises on 2 or more days a week.  Maintain a healthy weight. The body mass index (BMI) is a screening tool to identify possible weight problems. It provides an estimate of body fat based on height and weight. Your health care provider can find your BMI and can help you achieve or maintain a healthy weight.For adults 20 years and older:  A BMI below 18.5 is considered underweight.  A BMI of 18.5 to 24.9 is normal.  A BMI of 25 to 29.9 is considered overweight.  A BMI of  30 and above is considered obese.  Maintain normal blood lipids and cholesterol levels by exercising and minimizing your intake of saturated fat. Eat a balanced diet with plenty of fruit and vegetables. If your lipid or cholesterol levels are high, you are over 50, or you are at high risk for heart disease, you may need your cholesterol levels checked more frequently.Ongoing high lipid and cholesterol levels should be treated with medicines if diet and exercise are not working.  If you smoke, find out from your health care provider how to quit. If you do not use tobacco, do not start.  Lung cancer screening is recommended for adults aged 86-80 years who are at high risk for developing lung cancer because of a history of smoking. A yearly low-dose CT scan of the lungs is recommended for people who have at least a 30-pack-year history of smoking and are a current smoker or have quit within the past 15 years. A pack year of smoking is smoking an average of 1 pack of cigarettes a day for 1 year (for example: 1 pack a day for 30 years or 2 packs a day for 15 years). Yearly screening should continue until the smoker has stopped smoking for at least 15 years. Yearly screening should be stopped for people who develop a health problem that would prevent them from having lung cancer treatment.  Avoid use of street drugs. Do not share needles with anyone. Ask for help if you need support or instructions about stopping the use of drugs.  High blood  pressure causes heart disease and increases the risk of stroke.  Ongoing high blood pressure should be treated with medicines if weight loss and exercise do not work.  If you are 55-79 years old, ask your health care provider if you should take aspirin to prevent strokes.  Diabetes screening involves taking a blood sample to check your fasting blood sugar level. This should be done once every 3 years, after age 45, if you are within normal weight and without risk  factors for diabetes. Testing should be considered at a younger age or be carried out more frequently if you are overweight and have at least 1 risk factor for diabetes.  Breast cancer screening is essential preventive care for women. You should practice "breast self-awareness." This means understanding the normal appearance and feel of your breasts and may include breast self-examination. Any changes detected, no matter how small, should be reported to a health care provider. Women in their 20s and 30s should have a clinical breast exam (CBE) by a health care provider as part of a regular health exam every 1 to 3 years. After age 40, women should have a CBE every year. Starting at age 40, women should consider having a mammogram (breast X-ray test) every year. Women who have a family history of breast cancer should talk to their health care provider about genetic screening. Women at a high risk of breast cancer should talk to their health care providers about having an MRI and a mammogram every year.  Breast cancer gene (BRCA)-related cancer risk assessment is recommended for women who have family members with BRCA-related cancers. BRCA-related cancers include breast, ovarian, tubal, and peritoneal cancers. Having family members with these cancers may be associated with an increased risk for harmful changes (mutations) in the breast cancer genes BRCA1 and BRCA2. Results of the assessment will determine the need for genetic counseling and BRCA1 and BRCA2 testing.  Routine pelvic exams to screen for cancer are no longer recommended for nonpregnant women who are considered low risk for cancer of the pelvic organs (ovaries, uterus, and vagina) and who do not have symptoms. Ask your health care provider if a screening pelvic exam is right for you.  If you have had past treatment for cervical cancer or a condition that could lead to cancer, you need Pap tests and screening for cancer for at least 20 years after  your treatment. If Pap tests have been discontinued, your risk factors (such as having a new sexual partner) need to be reassessed to determine if screening should be resumed. Some women have medical problems that increase the chance of getting cervical cancer. In these cases, your health care provider may recommend more frequent screening and Pap tests.    Colorectal cancer can be detected and often prevented. Most routine colorectal cancer screening begins at the age of 50 years and continues through age 75 years. However, your health care provider may recommend screening at an earlier age if you have risk factors for colon cancer. On a yearly basis, your health care provider may provide home test kits to check for hidden blood in the stool. Use of a small camera at the end of a tube, to directly examine the colon (sigmoidoscopy or colonoscopy), can detect the earliest forms of colorectal cancer. Talk to your health care provider about this at age 50, when routine screening begins. Direct exam of the colon should be repeated every 5-10 years through age 75 years, unless early forms of pre-cancerous polyps   or small growths are found.  Osteoporosis is a disease in which the bones lose minerals and strength with aging. This can result in serious bone fractures or breaks. The risk of osteoporosis can be identified using a bone density scan. Women ages 62 years and over and women at risk for fractures or osteoporosis should discuss screening with their health care providers. Ask your health care provider whether you should take a calcium supplement or vitamin D to reduce the rate of osteoporosis.  Menopause can be associated with physical symptoms and risks. Hormone replacement therapy is available to decrease symptoms and risks. You should talk to your health care provider about whether hormone replacement therapy is right for you.  Use sunscreen. Apply sunscreen liberally and repeatedly throughout the day.  You should seek shade when your shadow is shorter than you. Protect yourself by wearing long sleeves, pants, a wide-brimmed hat, and sunglasses year round, whenever you are outdoors.  Once a month, do a whole body skin exam, using a mirror to look at the skin on your back. Tell your health care provider of new moles, moles that have irregular borders, moles that are larger than a pencil eraser, or moles that have changed in shape or color.  Stay current with required vaccines (immunizations).  Influenza vaccine. All adults should be immunized every year.  Tetanus, diphtheria, and acellular pertussis (Td, Tdap) vaccine. Pregnant women should receive 1 dose of Tdap vaccine during each pregnancy. The dose should be obtained regardless of the length of time since the last dose. Immunization is preferred during the 27th-36th week of gestation. An adult who has not previously received Tdap or who does not know her vaccine status should receive 1 dose of Tdap. This initial dose should be followed by tetanus and diphtheria toxoids (Td) booster doses every 10 years. Adults with an unknown or incomplete history of completing a 3-dose immunization series with Td-containing vaccines should begin or complete a primary immunization series including a Tdap dose. Adults should receive a Td booster every 10 years.    Zoster vaccine. One dose is recommended for adults aged 4 years or older unless certain conditions are present.    Pneumococcal 13-valent conjugate (PCV13) vaccine. When indicated, a person who is uncertain of her immunization history and has no record of immunization should receive the PCV13 vaccine. An adult aged 35 years or older who has certain medical conditions and has not been previously immunized should receive 1 dose of PCV13 vaccine. This PCV13 should be followed with a dose of pneumococcal polysaccharide (PPSV23) vaccine. The PPSV23 vaccine dose should be obtained at least 8 weeks after the  dose of PCV13 vaccine. An adult aged 85 years or older who has certain medical conditions and previously received 1 or more doses of PPSV23 vaccine should receive 1 dose of PCV13. The PCV13 vaccine dose should be obtained 1 or more years after the last PPSV23 vaccine dose.    Pneumococcal polysaccharide (PPSV23) vaccine. When PCV13 is also indicated, PCV13 should be obtained first. All adults aged 15 years and older should be immunized. An adult younger than age 38 years who has certain medical conditions should be immunized. Any person who resides in a nursing home or long-term care facility should be immunized. An adult smoker should be immunized. People with an immunocompromised condition and certain other conditions should receive both PCV13 and PPSV23 vaccines. People with human immunodeficiency virus (HIV) infection should be immunized as soon as possible after diagnosis. Immunization during  chemotherapy or radiation therapy should be avoided. Routine use of PPSV23 vaccine is not recommended for American Indians, [place] Natives, or people younger than 65 years unless there are medical conditions that require PPSV23 vaccine. When indicated, people who have unknown immunization and have no record of immunization should receive PPSV23 vaccine. One-time revaccination 5 years after the first dose of PPSV23 is recommended for people aged 19-64 years who have chronic kidney failure, nephrotic syndrome, asplenia, or immunocompromised conditions. People who received 1-2 doses of PPSV23 before age 80 years should receive another dose of PPSV23 vaccine at age 14 years or later if at least 5 years have passed since the previous dose. Doses of PPSV23 are not needed for people immunized with PPSV23 at or after age 47 years.   Preventive Services / Frequency  Ages 31 years and over  Blood pressure check.  Lipid and cholesterol check.  Lung cancer screening. / Every year if you are aged 34-80 years and have a  30-pack-year history of smoking and currently smoke or have quit within the past 15 years. Yearly screening is stopped once you have quit smoking for at least 15 years or develop a health problem that would prevent you from having lung cancer treatment.  Clinical breast exam.** / Every year after age 41 years.  BRCA-related cancer risk assessment.** / For women who have family members with a BRCA-related cancer (breast, ovarian, tubal, or peritoneal cancers).  Mammogram.** / Every year beginning at age 35 years and continuing for as long as you are in good health. Consult with your health care provider.  Pap test.** / Every 3 years starting at age 45 years through age 17 or 83 years with 3 consecutive normal Pap tests. Testing can be stopped between 65 and 70 years with 3 consecutive normal Pap tests and no abnormal Pap or HPV tests in the past 10 years.  Fecal occult blood test (FOBT) of stool. / Every year beginning at age 79 years and continuing until age 83 years. You may not need to do this test if you get a colonoscopy every 10 years.  Flexible sigmoidoscopy or colonoscopy.** / Every 5 years for a flexible sigmoidoscopy or every 10 years for a colonoscopy beginning at age 41 years and continuing until age 58 years.  Hepatitis C blood test.** / For all people born from [birth year] through 1963 and any individual with known risks for hepatitis C.  Osteoporosis screening.** / A one-time screening for women ages 50 years and over and women at risk for fractures or osteoporosis.  Skin self-exam. / Monthly.  Influenza vaccine. / Every year.  Tetanus, diphtheria, and acellular pertussis (Tdap/Td) vaccine.** / 1 dose of Td every 10 years.  Zoster vaccine.** / 1 dose for adults aged 52 years or older.  Pneumococcal 13-valent conjugate (PCV13) vaccine.** / Consult your health care provider.  Pneumococcal polysaccharide (PPSV23) vaccine.** / 1 dose for all adults aged 37 years and older. Screening  for abdominal aortic aneurysm (AAA)  by ultrasound is recommended for people who have history of high blood pressure or who are current or former smokers.  Osteoporosis Throughout your life, your body breaks down old bone and replaces it with new bone. As you get older, your body does not replace bone as quickly as it breaks it down. By the age of 6 years, most people begin to gradually lose bone because of the imbalance between bone loss and replacement. Some people lose more bone than others. Bone loss beyond  a specified normal degree is considered osteoporosis.  Osteoporosis affects the strength and durability of your bones. The inside of the ends of your bones and your flat bones, like the bones of your pelvis, look like honeycomb, filled with tiny open spaces. As bone loss occurs, your bones become less dense. This means that the open spaces inside your bones become bigger and the walls between these spaces become thinner. This makes your bones weaker. Bones of a person with osteoporosis can become so weak that they can break (fracture) during minor accidents, such as a simple fall. CAUSES  The following factors have been associated with the development of osteoporosis:  Smoking.  Drinking more than 2 alcoholic drinks several days per week.  Long-term use of certain medicines:  Corticosteroids.  Chemotherapy medicines.  Thyroid medicines.  Antiepileptic medicines.  Gonadal hormone suppression medicine.  Immunosuppression medicine.  Being underweight.  Lack of physical activity.  Lack of exposure to the sun. This can lead to vitamin D deficiency.  Certain medical conditions:  Certain inflammatory bowel diseases, such as Crohn disease and ulcerative colitis.  Diabetes.  Hyperthyroidism.  Hyperparathyroidism. RISK FACTORS Anyone can develop osteoporosis. However, the following factors can increase your risk of developing osteoporosis:  Gender--Women are at higher risk  than men.  Age--Being older than 50 years increases your risk.  Ethnicity--White and Asian people have an increased risk.  Weight --Being extremely underweight can increase your risk of osteoporosis.  Family history of osteoporosis--Having a family member who has developed osteoporosis can increase your risk. SYMPTOMS  Usually, people with osteoporosis have no symptoms.  DIAGNOSIS  Signs during a physical exam that may prompt your caregiver to suspect osteoporosis include:  Decreased height. This is usually caused by the compression of the bones that form your spine (vertebrae) because they have weakened and become fractured.  A curving or rounding of the upper back (kyphosis). To confirm signs of osteoporosis, your caregiver may request a procedure that uses 2 low-dose X-ray beams with different levels of energy to measure your bone mineral density (dual-energy X-ray absorptiometry [DXA]). Also, your caregiver may check your level of vitamin D. TREATMENT  The goal of osteoporosis treatment is to strengthen bones in order to decrease the risk of bone fractures. There are different types of medicines available to help achieve this goal. Some of these medicines work by slowing the processes of bone loss. Some medicines work by increasing bone density. Treatment also involves making sure that your levels of calcium and vitamin D are adequate. PREVENTION  There are things you can do to help prevent osteoporosis. Adequate intake of calcium and vitamin D can help you achieve optimal bone mineral density. Regular exercise can also help, especially resistance and weight-bearing activities. If you smoke, quitting smoking is an important part of osteoporosis prevention. MAKE SURE YOU:  Understand these instructions.  Will watch your condition.  Will get help right away if you are not doing well or get worse. FOR MORE INFORMATION www.osteo.org and [email or web address].[email or web address] Document Released: 02/14/2005  Document Revised: 09/01/2012 Document Reviewed: 04/21/2011 [facility] Patient Information 2015 [place], [place]. This information is not intended to replace advice given to you by your health care provider. Make sure you discuss any questions you have with your health care provider.

## 2014-11-15 NOTE — Progress Notes (Signed)
MEDICARE ANNUAL WELLNESS VISIT AND CPE  Assessment:   1. Mixed hyperlipidemia -continue medications, check lipids, decrease fatty foods, increase activity.  - DG Chest 2 View; Future - CBC with Differential/Platelet - BASIC METABOLIC PANEL WITH GFR - Hepatic function panel - TSH - Lipid panel - Urinalysis, Routine w reflex microscopic (not at [facility]) - Microalbumin / creatinine urine ratio - EKG 12-Lead  2. Mitral valve disorder monitored  3. Vitamin D deficiency - Vit D  25 hydroxy (rtn osteoporosis monitoring)  4. Breast cancer, unspecified laterality S/p mastecetomy  5. Anemia, unspecified anemia type - monitor, continue iron supp with Vitamin C and increase green leafy veggies  6. Routine general medical examination at a health care facility Get DEXA  7. Screening for depression Depression negative  8. High fall risk Discussed PT/DEXA  9. Osteoporosis - DG Bone Density; Future  10. Medication management - Magnesium  Over 40 minutes of exam, counseling, chart review and critical decision making was performed  Plan:   During the course of the visit the patient was educated and counseled about appropriate screening and preventive services including:    Pneumococcal vaccine   Influenza vaccine  Td vaccine  Screening electrocardiogram  Bone densitometry screening  Colorectal cancer screening  Diabetes screening  Glaucoma screening  Nutrition counseling   Advanced directives: requested  Conditions/risks identified: BMI: Discussed weight loss, diet, and increase physical activity.  Increase physical activity: AHA recommends 150 minutes of physical activity a week.  Medications reviewed No DM Urinary Incontinence is not an issue: discussed non pharmacology and pharmacology options.  Fall risk: low- discussed PT, home fall assessment, medications.    Subjective:  Helen Davis is a 74 y.o. female who presents for Medicare Annual Wellness  Visit and complete physical.  Date of last medicare wellness visit was 11/12/2013   Her blood pressure has been controlled at home, today their BP is BP: 110/68 mmHg She does workout. She denies chest pain, shortness of breath, dizziness.  She is not on cholesterol medication and denies myalgias. Her cholesterol is at goal. The cholesterol last visit was:   Lab Results  Component Value Date   CHOL 208* 11/12/2013   HDL 100 11/12/2013   LDLCALC 92 11/12/2013   TRIG 82 11/12/2013   CHOLHDL 2.1 11/12/2013    Last A1C in the office was:  Lab Results  Component Value Date   HGBA1C 5.2 11/12/2013   Patient is on Vitamin D supplement.   Lab Results  Component Value Date   VD25OH 51 11/12/2013     Her sister moved here and it has been stressful year for her however she is selling her condo and down sizing which will help decrease stress.  She is retired but will do care giving/keeping however she had a fall in Nov and had a lumbar compression fracture that she is still dealing with.  She is going to Horticulturist, commercial, Office manager at [employer].  S/p double mastectomy, does do self breast exams.   Medication Review: Current Outpatient Prescriptions on File Prior to Visit  Medication Sig Dispense Refill  . Ascorbic Acid (VITAMIN C) 1000 MG tablet Take 1,000 mg by mouth daily.    . Cholecalciferol (VITAMIN D PO) Take 2,000 Int'l Units by mouth daily.    . Cyanocobalamin (VITAMIN B 12 PO) Take 500 mcg by mouth daily.     Marland Kitchen MAGNESIUM PO Take 500 mg by mouth daily.     . Zinc 30 MG TABS Take by  mouth daily.     No current facility-administered medications on file prior to visit.    Current Problems (verified) Patient Active Problem List   Diagnosis Date Noted  . Mixed hyperlipidemia 11/15/2014  . Anemia   . Breast cancer   . Vitamin D deficiency   . Mitral valve disorder 10/11/2009    Screening Tests Preventative care: Last colonoscopy: 2013 repeat 5 years Last mammogram: 1999 s/p  bilateral mastectomy Last pap smear/pelvic exam: 2013 declines another DEXA: 2013 Osteoporosis, was on fosamax in the past, declines repeat eval CXR 2013 Echo 2011  Prior vaccinations: TD or Tdap: 2011  Influenza: declines  Pneumococcal: declines Prevnar13: declines and out of in the office Shingles/Zostavax: declines  Names of Other Physician/Practitioners you currently use: 1. [facility] here for primary care 2. Dr. Talbert Forest, eye doctor 3. Dr. Bobby Rumpf, dentist Patient Care Team: Unk Pinto, MD as PCP - General (Internal Medicine) Darleen Crocker, MD as Consulting Physician (Ophthalmology) Richmond Campbell, MD as Consulting Physician (Gastroenterology) Druscilla Brownie, MD as Consulting Physician (Dermatology) Garald Balding, MD as Consulting Physician (Orthopedic Surgery) Unice Bailey, MD as Consulting Physician (Rheumatology) Victorino Dike, DDS as Consulting Physician (Dentistry) Larey Dresser, MD as Consulting Physician (Cardiology)   SURGICAL HISTORY Past Surgical History  Procedure Laterality Date  . Mastectomy     FAMILY HISTORY Family History  Problem Relation Age of Onset  . Hypertension    . Stroke    . Cancer     SOCIAL HISTORY History  Substance Use Topics  . Smoking status: Former Smoker    Quit date: 05/21/1972  . Smokeless tobacco: Never Used  . Alcohol Use: 1.8 oz/week    3 Glasses of wine per week    MEDICARE WELLNESS OBJECTIVES: Tobacco use: She does not smoke.  Patient is a former smoker. Alcohol Current alcohol use: glass of wine with dinner Caffeine Current caffeine use: coffee 4 /day Osteoporosis: postmenopausal estrogen deficiency and dietary calcium and/or vitamin D deficiency, History of fracture in the past year: no Diet: in general, a "healthy" diet   Physical activity: yard work and walking Depression/mood screen:   No flowsheet data found. Hearing: normal Visual acuity: normal,  does  perform annual eye exam  ADLs:  In your present state of health, do you have any difficulty performing the following activities: 11/15/2014  Hearing? N  Vision? N  Difficulty concentrating or making decisions? N  Walking or climbing stairs? N  Dressing or bathing? N  Doing errands, shopping? N  Preparing Food and eating ? N  Using the Toilet? N  In the past six months, have you accidently leaked urine? N  Do you have problems with loss of bowel control? N  Managing your Medications? N  Managing your Finances? N  Housekeeping or managing your Housekeeping? N    Fall risk: Low Risk Cognitive Testing  Alert? Yes  Normal Appearance?Yes  Oriented to person? Yes  Place? Yes   Time? Yes  Recall of three objects?  Yes  Can perform simple calculations? Yes  Displays appropriate judgment?Yes  Can read the correct time from a watch face?Yes  EOL planning: Does patient have an advance directive?: Yes (SON- Adrienne Mocha) Type of Advance Directive: Healthcare Power of Attorney, Living will Does patient want to make changes to advanced directive?: No - Patient declined Copy of advanced directive(s) in chart?: No - copy requested    Objective:     Blood pressure 110/68, pulse 64, temperature  98.2 F (36.8 C), resp. rate 16, height 5' 3.75" (1.619 m), weight 95 lb (43.092 kg). Body mass index is 16.44 kg/(m^2).  General appearance: alert, no distress, WD/WN, female HEENT: normocephalic, sclerae anicteric, TMs pearly, nares patent, no discharge or erythema, pharynx normal Oral cavity: MMM, no lesions Neck: supple, no lymphadenopathy, no thyromegaly, no masses Heart: RRR, normal S1, S2, no murmurs Lungs: CTA bilaterally, no wheezes, rhonchi, or rales Abdomen: +bs, soft, non tender, non distended, no masses, no hepatomegaly, no splenomegaly Chest: s/p bilateral mastectomy without any lumps.  Musculoskeletal: nontender, no swelling, no obvious deformity Extremities: no edema, no  cyanosis, no clubbing Pulses: 2+ symmetric, upper and lower extremities, normal cap refill Neurological: alert, oriented x 3, CN2-12 intact, strength normal upper extremities and lower extremities, sensation normal throughout, DTRs 2+ throughout, no cerebellar signs, gait normal Psychiatric: normal affect, behavior normal, pleasant   Medicare Attestation I have personally reviewed: The patient's medical and social history Their use of alcohol, tobacco or illicit drugs Their current medications and supplements The patient's functional ability including ADLs,fall risks, home safety risks, cognitive, and hearing and visual impairment Diet and physical activities Evidence for depression or mood disorders  The patient's weight, height, BMI, and visual acuity have been recorded in the chart.  I have made referrals, counseling, and provided education to the patient based on review of the above and I have provided the patient with a written personalized care plan for preventive services.     Vicie Mutters, PA-C   11/15/2014

## 2014-11-16 LAB — URINALYSIS, ROUTINE W REFLEX MICROSCOPIC
BILIRUBIN URINE: NEGATIVE
GLUCOSE, UA: NEGATIVE mg/dL
HGB URINE DIPSTICK: NEGATIVE
KETONES UR: NEGATIVE mg/dL
Nitrite: NEGATIVE
PROTEIN: NEGATIVE mg/dL
Specific Gravity, Urine: 1.007 (ref 1.005–1.030)
Urobilinogen, UA: 0.2 mg/dL (ref 0.0–1.0)
pH: 7.5 (ref 5.0–8.0)

## 2014-11-16 LAB — BASIC METABOLIC PANEL WITH GFR
BUN: 8 mg/dL (ref 6–23)
CHLORIDE: 97 meq/L (ref 96–112)
CO2: 29 meq/L (ref 19–32)
CREATININE: 0.63 mg/dL (ref 0.50–1.10)
Calcium: 9.6 mg/dL (ref 8.4–10.5)
GFR, Est African American: >89 mL/min
Glucose, Bld: 84 mg/dL (ref 70–99)
POTASSIUM: 4.3 meq/L (ref 3.5–5.3)
SODIUM: 133 meq/L — AB (ref 135–145)

## 2014-11-16 LAB — URINALYSIS, MICROSCOPIC ONLY
BACTERIA UA: NONE SEEN
Casts: NONE SEEN
Crystals: NONE SEEN
SQUAMOUS EPITHELIAL / LPF: NONE SEEN

## 2014-11-16 LAB — HEPATIC FUNCTION PANEL
ALK PHOS: 63 U/L (ref 39–117)
ALT: 14 U/L (ref 0–35)
AST: 19 U/L (ref 0–37)
Albumin: 4.2 g/dL (ref 3.5–5.2)
BILIRUBIN DIRECT: 0.2 mg/dL (ref 0.0–0.3)
BILIRUBIN INDIRECT: 0.7 mg/dL (ref 0.2–1.2)
BILIRUBIN TOTAL: 0.9 mg/dL (ref 0.2–1.2)
Total Protein: 6.5 g/dL (ref 6.0–8.3)

## 2014-11-16 LAB — LIPID PANEL
Cholesterol: 201 mg/dL — ABNORMAL HIGH (ref 0–200)
HDL: 124 mg/dL (ref >=46)
LDL CALC: 63 mg/dL (ref 0–99)
TRIGLYCERIDES: 68 mg/dL (ref <150)
Total CHOL/HDL Ratio: 1.6 Ratio
VLDL: 14 mg/dL (ref 0–40)

## 2014-11-16 LAB — IRON AND TIBC
%SAT: 36 % (ref 20–55)
Iron: 126 ug/dL (ref 42–145)
TIBC: 346 ug/dL (ref 250–470)
UIBC: 220 ug/dL (ref 125–400)

## 2014-11-16 LAB — MICROALBUMIN / CREATININE URINE RATIO
Creatinine, Urine: 36.6 mg/dL
MICROALB/CREAT RATIO: 5.5 mg/g (ref 0.0–30.0)
Microalb, Ur: 0.2 mg/dL (ref <2.0)

## 2014-11-16 LAB — VITAMIN B12: Vitamin B-12: 655 pg/mL (ref 211–911)

## 2014-11-16 LAB — VITAMIN D 25 HYDROXY (VIT D DEFICIENCY, FRACTURES): Vit D, 25-Hydroxy: 40 ng/mL (ref 30–100)

## 2014-11-16 LAB — MAGNESIUM: MAGNESIUM: 2 mg/dL (ref 1.5–2.5)

## 2014-11-16 LAB — FERRITIN: FERRITIN: 29 ng/mL (ref 10–291)

## 2014-11-16 LAB — TSH: TSH: 1.011 u[IU]/mL (ref 0.350–4.500)

## 2014-11-23 ENCOUNTER — Encounter: Payer: Self-pay | Admitting: Emergency Medicine

## 2014-12-27 ENCOUNTER — Other Ambulatory Visit: Payer: Self-pay | Admitting: Physician Assistant

## 2015-01-19 ENCOUNTER — Other Ambulatory Visit: Payer: Medicare Other

## 2015-06-28 DIAGNOSIS — H26493 Other secondary cataract, bilateral: Secondary | ICD-10-CM | POA: Diagnosis not present

## 2015-06-28 DIAGNOSIS — Z961 Presence of intraocular lens: Secondary | ICD-10-CM | POA: Diagnosis not present

## 2015-06-28 DIAGNOSIS — H52223 Regular astigmatism, bilateral: Secondary | ICD-10-CM | POA: Diagnosis not present

## 2015-07-28 ENCOUNTER — Ambulatory Visit (INDEPENDENT_AMBULATORY_CARE_PROVIDER_SITE_OTHER): Payer: Medicare Other | Admitting: Internal Medicine

## 2015-07-28 ENCOUNTER — Encounter: Payer: Self-pay | Admitting: Internal Medicine

## 2015-07-28 VITALS — BP 112/58 | HR 68 | Temp 97.8°F | Resp 16 | Ht 63.75 in | Wt 98.0 lb

## 2015-07-28 DIAGNOSIS — R6889 Other general symptoms and signs: Secondary | ICD-10-CM | POA: Diagnosis not present

## 2015-07-28 DIAGNOSIS — C50919 Malignant neoplasm of unspecified site of unspecified female breast: Secondary | ICD-10-CM | POA: Diagnosis not present

## 2015-07-28 DIAGNOSIS — Z0000 Encounter for general adult medical examination without abnormal findings: Secondary | ICD-10-CM

## 2015-07-28 DIAGNOSIS — E782 Mixed hyperlipidemia: Secondary | ICD-10-CM

## 2015-07-28 DIAGNOSIS — M810 Age-related osteoporosis without current pathological fracture: Secondary | ICD-10-CM

## 2015-07-28 DIAGNOSIS — D649 Anemia, unspecified: Secondary | ICD-10-CM

## 2015-07-28 DIAGNOSIS — M150 Primary generalized (osteo)arthritis: Secondary | ICD-10-CM

## 2015-07-28 DIAGNOSIS — M8949 Other hypertrophic osteoarthropathy, multiple sites: Secondary | ICD-10-CM

## 2015-07-28 DIAGNOSIS — M159 Polyosteoarthritis, unspecified: Secondary | ICD-10-CM

## 2015-07-28 DIAGNOSIS — I059 Rheumatic mitral valve disease, unspecified: Secondary | ICD-10-CM | POA: Diagnosis not present

## 2015-07-28 DIAGNOSIS — E559 Vitamin D deficiency, unspecified: Secondary | ICD-10-CM

## 2015-07-28 DIAGNOSIS — M21619 Bunion of unspecified foot: Secondary | ICD-10-CM | POA: Diagnosis not present

## 2015-07-28 DIAGNOSIS — Z0001 Encounter for general adult medical examination with abnormal findings: Secondary | ICD-10-CM | POA: Diagnosis not present

## 2015-07-28 DIAGNOSIS — M1990 Unspecified osteoarthritis, unspecified site: Secondary | ICD-10-CM | POA: Insufficient documentation

## 2015-07-28 NOTE — Progress Notes (Signed)
MEDICARE ANNUAL WELLNESS VISIT AND FOLLOW UP  Assessment:    1. Routine general medical examination at a health care facility -due next year  2. Bunion of unspecified foot -see podiatry -not willing to have surgery -has consequent hammer toes and claw toes  3. Primary osteoarthritis involving multiple joints -ibuprofen prn  4. Osteoporosis -cont vit D -refuses meds  5. Mitral valve disorder -MVP -no evidence of heart failure  6. Anemia, unspecified anemia type -cbc w/ diff during physical  7. Malignant neoplasm of female breast, unspecified laterality, unspecified site of breast ([place]) -bilateral mastectomy  8. Vitamin D deficiency -cont supplement  9. Mixed hyperlipidemia -cont diet and exercise    Over 30 minutes of exam, counseling, chart review, and critical decision making was performed  Plan:   During the course of the visit the patient was educated and counseled about appropriate screening and preventive services including:    Pneumococcal vaccine   Influenza vaccine  Td vaccine  Prevnar 13  Screening electrocardiogram  Screening mammography  Bone densitometry screening  Colorectal cancer screening  Diabetes screening  Glaucoma screening  Nutrition counseling   Advanced directives: given info/requested copies  Conditions/risks identified: Diabetes is not at goal, ACE/ARB therapy: No, Reason not on Ace Inhibitor/ARB therapy:  low blood pressure Urinary Incontinence is not an issue: discussed non pharmacology and pharmacology options.  Fall risk: high- discussed PT, home fall assessment, medications.    Subjective:   Helen Davis is a 75 y.o. female who presents for Medicare Annual Wellness Visit and 3 month follow up on hypertension, prediabetes, hyperlipidemia, vitamin D def.  Date of last medicare wellness visit is unknown.   Her blood pressure has been controlled at home, today their BP is BP: (!) 112/58 mmHg She does  workout. She denies chest pain, shortness of breath, dizziness.  She reports that she does a lot of walking.    She is on cholesterol medication and denies myalgias. Her cholesterol is at goal. The cholesterol last visit was:   Lab Results  Component Value Date   CHOL 201* 11/15/2014   HDL 124 11/15/2014   LDLCALC 63 11/15/2014   TRIG 68 11/15/2014   CHOLHDL 1.6 11/15/2014   She has been working on diet and exercise for prediabetes, and denies foot ulcerations, hyperglycemia, hypoglycemia , increased appetite, nausea, paresthesia of the feet, polydipsia, polyuria, visual disturbances, vomiting and weight loss. Last A1C in the office was:  Lab Results  Component Value Date   HGBA1C 5.2 11/12/2013   Last GFR NonAA   Lab Results  Component Value Date   [facility] >89 11/15/2014   AA  Lab Results  Component Value Date   GFRAA >89 11/15/2014   Patient is on Vitamin D supplement. Lab Results  Component Value Date   VD25OH 40 11/15/2014     She reports that her feet have been bothering her a lot.  She reports that she has a lot of issues with hammer toes and also with her bunions.     Medication Review Current Outpatient Prescriptions on File Prior to Visit  Medication Sig Dispense Refill  . Ascorbic Acid (VITAMIN C) 1000 MG tablet Take 1,000 mg by mouth daily.    . Cholecalciferol (VITAMIN D PO) Take 2,000 Int'l Units by mouth daily.    . Cyanocobalamin (VITAMIN B 12 PO) Take 500 mcg by mouth daily.     Marland Kitchen MAGNESIUM PO Take 500 mg by mouth daily.     . Zinc 30  MG TABS Take by mouth daily.     No current facility-administered medications on file prior to visit.    Current Problems (verified) Patient Active Problem List   Diagnosis Date Noted  . Bunion of unspecified foot 07/28/2015  . DJD (degenerative joint disease) 07/28/2015  . Mixed hyperlipidemia 11/15/2014  . Osteoporosis 11/15/2014  . Anemia   . Breast cancer ([place])   . Vitamin D deficiency   . Mitral valve  disorder 10/11/2009    Screening Tests Immunization History  Administered Date(s) Administered  . Tdap 09/18/2009    Preventative care: Last colonoscopy: 2013 Last mammogram: Double mastectom DEXA:Known osteoporosis, declined  Prior vaccinations: TD or Tdap: 2011  Influenza: Declined  Pneumococcal: Declined today Prevnar13: Declined today Shingles/Zostavax: Declined  Names of Other Physician/Practitioners you currently use: 1. [facility]- here for primary care 2. Dr. Clydene Laming, eye doctor, last visit 2016 3. Dr. Bobby Rumpf, dentist, last visit 2016  Patient Care Team: Unk Pinto, MD as PCP - General (Internal Medicine) Darleen Crocker, MD as Consulting Physician (Ophthalmology) Richmond Campbell, MD as Consulting Physician (Gastroenterology) Druscilla Brownie, MD as Consulting Physician (Dermatology) Garald Balding, MD as Consulting Physician (Orthopedic Surgery) Unice Bailey, MD as Consulting Physician (Rheumatology) Victorino Dike, DDS as Consulting Physician (Dentistry) Larey Dresser, MD as Consulting Physician (Cardiology)  Past Surgical History  Procedure Laterality Date  . Mastectomy     Family History  Problem Relation Age of Onset  . Hypertension    . Stroke    . Cancer     Social History  Substance Use Topics  . Smoking status: Former Smoker    Quit date: 05/21/1972  . Smokeless tobacco: Never Used  . Alcohol Use: 1.8 oz/week    3 Glasses of wine per week    MEDICARE WELLNESS OBJECTIVES: Tobacco use: She does not smoke.  Patient is a former smoker. If yes, counseling given Alcohol Current alcohol use: glass of wine with dinner Osteoporosis: postmenopausal estrogen deficiency, History of fracture in the past year: yes Fall risk: Moderate Risk Hearing: normal Visual acuity: normal,  does perform annual eye exam Diet: well balanced Physical activity: Current Exercise Habits: Home exercise routine, Type of  exercise: walking, Time (Minutes): 40, Frequency (Times/Week): 6, Weekly Exercise (Minutes/Week): 240, Intensity: Moderate Cardiac risk factors: Cardiac Risk Factors include: advanced age (>6men, >69 women);family history of premature cardiovascular disease Depression/mood screen:   Depression screen [facility] 2/9 07/28/2015  Decreased Interest 0  Down, Depressed, Hopeless 0  PHQ - 2 Score 0    ADLs:  In your present state of health, do you have any difficulty performing the following activities: 07/28/2015 11/15/2014  Hearing? N N  Vision? N N  Difficulty concentrating or making decisions? N N  Walking or climbing stairs? N N  Dressing or bathing? N N  Doing errands, shopping? N N  Preparing Food and eating ? N N  Using the Toilet? N N  In the past six months, have you accidently leaked urine? N N  Do you have problems with loss of bowel control? N N  Managing your Medications? N N  Managing your Finances? N N  Housekeeping or managing your Housekeeping? N N     Cognitive Testing  Alert? Yes  Normal Appearance?Yes  Oriented to person? Yes  Place? Yes   Time? Yes  Recall of three objects?  Yes  Can perform simple calculations? Yes  Displays appropriate judgment?Yes  Can read the correct time from a watch  face?Yes  EOL planning: Does patient have an advance directive?: Yes Type of Advance Directive: Healthcare Power of Attorney, Living will Does patient want to make changes to advanced directive?: No - Patient declined Copy of advanced directive(s) in chart?: No - copy requested   Objective:   Today's Vitals   07/28/15 1408  BP: 112/58  Pulse: 68  Temp: 97.8 F (36.6 C)  TempSrc: Temporal  Resp: 16  Height: 5' 3.75" (1.619 m)  Weight: 98 lb (44.453 kg)   Body mass index is 16.96 kg/(m^2).  General appearance: alert, no distress, WD/WN,  female HEENT: normocephalic, sclerae anicteric, TMs pearly, nares patent, no discharge or erythema, pharynx normal Oral cavity: MMM, no  lesions Neck: supple, no lymphadenopathy, no thyromegaly, no masses Heart: RRR, normal S1, S2, no murmurs Lungs: CTA bilaterally, no wheezes, rhonchi, or rales Abdomen: +bs, soft, non tender, non distended, no masses, no hepatomegaly, no splenomegaly Musculoskeletal: nontender, no swelling, no obvious deformity Extremities: no edema, no cyanosis, no clubbing Pulses: 2+ symmetric, upper and lower extremities, normal cap refill Neurological: alert, oriented x 3, CN2-12 intact, strength normal upper extremities and lower extremities, sensation normal throughout, DTRs 2+ throughout, no cerebellar signs, gait normal Psychiatric: normal affect, behavior normal, pleasant  Breast: defer Gyn: defer Rectal: defer   Medicare Attestation I have personally reviewed: The patient's medical and social history Their use of alcohol, tobacco or illicit drugs Their current medications and supplements The patient's functional ability including ADLs,fall risks, home safety risks, cognitive, and hearing and visual impairment Diet and physical activities Evidence for depression or mood disorders  The patient's weight, height, BMI, and visual acuity have been recorded in the chart.  I have made referrals, counseling, and provided education to the patient based on review of the above and I have provided the patient with a written personalized care plan for preventive services.     Starlyn Skeans, PA-C   07/28/2015

## 2015-11-21 ENCOUNTER — Encounter: Payer: Self-pay | Admitting: Physician Assistant

## 2015-12-08 ENCOUNTER — Encounter: Payer: Self-pay | Admitting: Physician Assistant

## 2015-12-08 ENCOUNTER — Other Ambulatory Visit: Payer: Self-pay | Admitting: Physician Assistant

## 2015-12-08 ENCOUNTER — Ambulatory Visit (INDEPENDENT_AMBULATORY_CARE_PROVIDER_SITE_OTHER): Payer: Medicare Other | Admitting: Physician Assistant

## 2015-12-08 VITALS — BP 100/60 | HR 63 | Temp 97.7°F | Resp 14 | Ht 64.5 in | Wt 96.4 lb

## 2015-12-08 DIAGNOSIS — Z136 Encounter for screening for cardiovascular disorders: Secondary | ICD-10-CM

## 2015-12-08 DIAGNOSIS — R6889 Other general symptoms and signs: Secondary | ICD-10-CM | POA: Diagnosis not present

## 2015-12-08 DIAGNOSIS — E441 Mild protein-calorie malnutrition: Secondary | ICD-10-CM

## 2015-12-08 DIAGNOSIS — Z0001 Encounter for general adult medical examination with abnormal findings: Secondary | ICD-10-CM | POA: Diagnosis not present

## 2015-12-08 DIAGNOSIS — D649 Anemia, unspecified: Secondary | ICD-10-CM | POA: Diagnosis not present

## 2015-12-08 DIAGNOSIS — I10 Essential (primary) hypertension: Secondary | ICD-10-CM | POA: Diagnosis not present

## 2015-12-08 DIAGNOSIS — E559 Vitamin D deficiency, unspecified: Secondary | ICD-10-CM | POA: Diagnosis not present

## 2015-12-08 DIAGNOSIS — I059 Rheumatic mitral valve disease, unspecified: Secondary | ICD-10-CM

## 2015-12-08 DIAGNOSIS — C50919 Malignant neoplasm of unspecified site of unspecified female breast: Secondary | ICD-10-CM

## 2015-12-08 DIAGNOSIS — Z1389 Encounter for screening for other disorder: Secondary | ICD-10-CM

## 2015-12-08 DIAGNOSIS — E782 Mixed hyperlipidemia: Secondary | ICD-10-CM

## 2015-12-08 DIAGNOSIS — M810 Age-related osteoporosis without current pathological fracture: Secondary | ICD-10-CM

## 2015-12-08 DIAGNOSIS — M159 Polyosteoarthritis, unspecified: Secondary | ICD-10-CM

## 2015-12-08 DIAGNOSIS — Z79899 Other long term (current) drug therapy: Secondary | ICD-10-CM | POA: Diagnosis not present

## 2015-12-08 DIAGNOSIS — Z0000 Encounter for general adult medical examination without abnormal findings: Secondary | ICD-10-CM | POA: Diagnosis not present

## 2015-12-08 DIAGNOSIS — Z139 Encounter for screening, unspecified: Secondary | ICD-10-CM | POA: Diagnosis not present

## 2015-12-08 DIAGNOSIS — M150 Primary generalized (osteo)arthritis: Secondary | ICD-10-CM

## 2015-12-08 DIAGNOSIS — M8949 Other hypertrophic osteoarthropathy, multiple sites: Secondary | ICD-10-CM

## 2015-12-08 LAB — CBC WITH DIFFERENTIAL/PLATELET
Basophils Absolute: 50 cells/uL (ref 0–200)
Basophils Relative: 1 %
Eosinophils Absolute: 100 cells/uL (ref 15–500)
Eosinophils Relative: 2 %
HEMATOCRIT: 33.8 % — AB (ref 35.0–45.0)
Hemoglobin: 11.3 g/dL — ABNORMAL LOW (ref 11.7–15.5)
LYMPHS PCT: 29 %
Lymphs Abs: 1450 cells/uL (ref 850–3900)
MCH: 31.1 pg (ref 27.0–33.0)
MCHC: 33.4 g/dL (ref 32.0–36.0)
MCV: 93.1 fL (ref 80.0–100.0)
MONO ABS: 500 {cells}/uL (ref 200–950)
MPV: 10.9 fL (ref 7.5–12.5)
Monocytes Relative: 10 %
NEUTROS PCT: 58 %
Neutro Abs: 2900 cells/uL (ref 1500–7800)
Platelets: 148 10*3/uL (ref 140–400)
RBC: 3.63 MIL/uL — AB (ref 3.80–5.10)
RDW: 12.9 % (ref 11.0–15.0)
WBC: 5 10*3/uL (ref 3.8–10.8)

## 2015-12-08 LAB — TSH: TSH: 0.72 mIU/L

## 2015-12-08 NOTE — Progress Notes (Signed)
CPE AND 3 MONTH FOLLOW UP  Assessment:   Mixed hyperlipidemia -continue medications, check lipids, decrease fatty foods, increase activity.  - CBC with Differential/Platelet - BASIC METABOLIC PANEL WITH GFR - Hepatic function panel - TSH - Lipid panel - Urinalysis, Routine w reflex microscopic (not at [facility]) - Microalbumin / creatinine urine ratio - EKG 12-Lead  Mitral valve disorder monitored  Vitamin D deficiency - Vit D  25 hydroxy (rtn osteoporosis monitoring)  Breast cancer, unspecified laterality S/p mastecetomy   Anemia, unspecified anemia type - monitor, continue iron supp with Vitamin C and increase green leafy veggies  Routine general medical examination at a health care facility Get DEXA but patient declines Will consider prevnar  Osteoporosis declines  Medication management - Magnesium  Mild malnutrition Add ensure at lunch, samples given  Over 40 minutes of exam, counseling, chart review and critical decision making was performed  Subjective:  Helen Davis is a 75 y.o. female who presents CPE and 3 month follow up.    Her blood pressure has been controlled at home, today their BP is BP: 100/60 mmHg She does workout. She denies chest pain, shortness of breath, dizziness.  She is not on cholesterol medication and denies myalgias. Her cholesterol is at goal. The cholesterol last visit was:   Lab Results  Component Value Date   CHOL 201* 11/15/2014   HDL 124 11/15/2014   LDLCALC 63 11/15/2014   TRIG 68 11/15/2014   CHOLHDL 1.6 11/15/2014    Last A1C in the office was:  Lab Results  Component Value Date   HGBA1C 5.2 11/12/2013   Patient is on Vitamin D supplement.   Lab Results  Component Value Date   VD25OH 84 11/15/2014     She is retired but will do care giving/keeping however she had a fall in Nov and had a lumbar compression fracture that she is still dealing with.  Sister charlotte lives near her, there is tension, lives in  apartment at [facility].  She is going to Horticulturist, commercial, Office manager at [employer].  S/p double mastectomy, does do self breast exams.  BMI is Body mass index is 16.3 kg/(m^2)., she is working on increasing her weight.  Wt Readings from Last 3 Encounters:  12/08/15 96 lb 6.4 oz (43.727 kg)  07/28/15 98 lb (44.453 kg)  11/15/14 95 lb (43.092 kg)     Medication Review: Current Outpatient Prescriptions on File Prior to Visit  Medication Sig Dispense Refill  . Ascorbic Acid (VITAMIN C) 1000 MG tablet Take 1,000 mg by mouth daily.    . Cholecalciferol (VITAMIN D PO) Take 2,000 Int'l Units by mouth daily.    . Cyanocobalamin (VITAMIN B 12 PO) Take 500 mcg by mouth daily.     Marland Kitchen MAGNESIUM PO Take 500 mg by mouth daily.     . Zinc 30 MG TABS Take by mouth daily.     No current facility-administered medications on file prior to visit.    Current Problems (verified) Patient Active Problem List   Diagnosis Date Noted  . Bunion of unspecified foot 07/28/2015  . DJD (degenerative joint disease) 07/28/2015  . Mixed hyperlipidemia 11/15/2014  . Osteoporosis 11/15/2014  . Anemia   . Breast cancer ([place])   . Vitamin D deficiency   . Mitral valve disorder 10/11/2009   Screening Tests Preventative care: Last colonoscopy: 2013 repeat 5 years Last mammogram: 1999 s/p bilateral mastectomy Last pap smear/pelvic exam: 2013 declines another DEXA: 2013 Osteoporosis, was on fosamax  in the past, declines repeat eval CXR 2013 Echo 2011  Prior vaccinations: TD or Tdap: 2011  Influenza: declines  Pneumococcal: declines Prevnar13: declines Shingles/Zostavax: declines  Names of Other Physician/Practitioners you currently use: 1. [facility] here for primary care 2. Dr. Talbert Forest, eye doctor 3. Dr. Bobby Rumpf, dentist Patient Care Team: Unk Pinto, MD as PCP - General (Internal Medicine) Darleen Crocker, MD as Consulting Physician (Ophthalmology) Richmond Campbell, MD  as Consulting Physician (Gastroenterology) Druscilla Brownie, MD as Consulting Physician (Dermatology) Garald Balding, MD as Consulting Physician (Orthopedic Surgery) Unice Bailey, MD as Consulting Physician (Rheumatology) Victorino Dike, DDS as Consulting Physician (Dentistry) Larey Dresser, MD as Consulting Physician (Cardiology)  Allergies No Known Allergies  SURGICAL HISTORY She  has past surgical history that includes Mastectomy. FAMILY HISTORY Her family history is not on file. SOCIAL HISTORY She  reports that she quit smoking about 43 years ago. She has never used smokeless tobacco. She reports that she drinks about 1.8 oz of alcohol per week. She reports that she does not use illicit drugs.  Review of Systems  Constitutional: Negative.   HENT: Negative.   Eyes: Negative.   Respiratory: Negative.   Cardiovascular: Negative.   Gastrointestinal: Negative.   Genitourinary: Negative.   Musculoskeletal: Negative.   Skin: Negative.   Neurological: Negative.   Psychiatric/Behavioral: Negative.      Objective:     Blood pressure 100/60, pulse 63, temperature 97.7 F (36.5 C), temperature source Temporal, resp. rate 14, height 5' 4.5" (1.638 m), weight 96 lb 6.4 oz (43.727 kg), SpO2 98 %. Body mass index is 16.3 kg/(m^2).  General appearance: alert, no distress, WD/WN, female HEENT: normocephalic, sclerae anicteric, TMs pearly, nares patent, no discharge or erythema, pharynx normal Oral cavity: MMM, no lesions Neck: supple, no lymphadenopathy, no thyromegaly, no masses Heart: RRR, normal S1, S2, no murmurs Lungs: CTA bilaterally, no wheezes, rhonchi, or rales Abdomen: +bs, soft, non tender, non distended, no masses, no hepatomegaly, no splenomegaly Chest: s/p bilateral mastectomy without any lumps.  Musculoskeletal: nontender, no swelling, no obvious deformity Extremities: no edema, no cyanosis, no clubbing Pulses: 2+ symmetric, upper and lower extremities, normal  cap refill Neurological: alert, oriented x 3, CN2-12 intact, strength normal upper extremities and lower extremities, sensation normal throughout, DTRs 2+ throughout, no cerebellar signs, gait normal Psychiatric: normal affect, behavior normal, pleasant   Medicare Attestation I have personally reviewed: The patient's medical and social history Their use of alcohol, tobacco or illicit drugs Their current medications and supplements The patient's functional ability including ADLs,fall risks, home safety risks, cognitive, and hearing and visual impairment Diet and physical activities Evidence for depression or mood disorders  The patient's weight, height, BMI, and visual acuity have been recorded in the chart.  I have made referrals, counseling, and provided education to the patient based on review of the above and I have provided the patient with a written personalized care plan for preventive services.     Vicie Mutters, PA-C   12/08/2015

## 2015-12-08 NOTE — Patient Instructions (Signed)
Pneumococcal Vaccine, Polyvalent suspension for injection  What is this medicine?  PNEUMOCOCCAL VACCINE (NEU mo KOK al vak SEEN) is a vaccine used to prevent pneumococcus bacterial infections. These bacteria can cause serious infections like pneumonia, meningitis, and blood infections. This vaccine will lower your chance of getting pneumonia. If you do get pneumonia, it can make your symptoms milder and your illness shorter. This vaccine will not treat an infection and will not cause infection. This vaccine is recommended for infants and young children, adults with certain medical conditions, and adults 65 years or older.  This medicine may be used for other purposes; ask your health care provider or pharmacist if you have questions.  What should I tell my health care provider before I take this medicine?  They need to know if you have any of these conditions:  -bleeding problems  -fever  -immune system problems  -an unusual or allergic reaction to pneumococcal vaccine, diphtheria toxoid, other vaccines, latex, other medicines, foods, dyes, or preservatives  -pregnant or trying to get pregnant  -breast-feeding  How should I use this medicine?  This vaccine is for injection into a muscle. It is given by a health care professional.  A copy of Vaccine Information Statements will be given before each vaccination. Read this sheet carefully each time. The sheet may change frequently.  Talk to your pediatrician regarding the use of this medicine in children. While this drug may be prescribed for children as young as 6 weeks old for selected conditions, precautions do apply.  Overdosage: If you think you have taken too much of this medicine contact a poison control center or emergency room at once.  NOTE: This medicine is only for you. Do not share this medicine with others.  What if I miss a dose?  It is important not to miss your dose. Call your doctor or health care professional if you are unable to keep an  appointment.  What may interact with this medicine?  -medicines for cancer chemotherapy  -medicines that suppress your immune function  -steroid medicines like prednisone or cortisone  This list may not describe all possible interactions. Give your health care provider a list of all the medicines, herbs, non-prescription drugs, or dietary supplements you use. Also tell them if you smoke, drink alcohol, or use illegal drugs. Some items may interact with your medicine.  What should I watch for while using this medicine?  Mild fever and pain should go away in 3 days or less. Report any unusual symptoms to your doctor or health care professional.  What side effects may I notice from receiving this medicine?  Side effects that you should report to your doctor or health care professional as soon as possible:  -allergic reactions like skin rash, itching or hives, swelling of the face, lips, or tongue  -breathing problems  -confused  -fast or irregular heartbeat  -fever over 102 degrees F  -seizures  -unusual bleeding or bruising  -unusual muscle weakness  Side effects that usually do not require medical attention (report to your doctor or health care professional if they continue or are bothersome):  -aches and pains  -diarrhea  -fever of 102 degrees F or less  -headache  -irritable  -loss of appetite  -pain, tender at site where injected  -trouble sleeping  This list may not describe all possible side effects. Call your doctor for medical advice about side effects. You may report side effects to FDA at 1-800-FDA-1088.  Where should I keep   my medicine?  This does not apply. This vaccine is given in a clinic, pharmacy, doctor's office, or other health care setting and will not be stored at home.  NOTE: This sheet is a summary. It may not cover all possible information. If you have questions about this medicine, talk to your doctor, pharmacist, or health care provider.     © 2016, Elsevier/Gold Standard. (2014-02-11  10:27:27)

## 2015-12-09 ENCOUNTER — Encounter: Payer: Self-pay | Admitting: Physician Assistant

## 2015-12-09 DIAGNOSIS — J449 Chronic obstructive pulmonary disease, unspecified: Secondary | ICD-10-CM | POA: Insufficient documentation

## 2015-12-09 LAB — LIPID PANEL
Cholesterol: 195 mg/dL (ref 125–200)
HDL: 130 mg/dL (ref >=46)
LDL Cholesterol: 50 mg/dL (ref <130)
Total CHOL/HDL Ratio: 1.5 Ratio (ref <=5.0)
Triglycerides: 77 mg/dL (ref <150)
VLDL: 15 mg/dL (ref <30)

## 2015-12-09 LAB — URINALYSIS, ROUTINE W REFLEX MICROSCOPIC
Bilirubin Urine: NEGATIVE
Glucose, UA: NEGATIVE
HGB URINE DIPSTICK: NEGATIVE
KETONES UR: NEGATIVE
Leukocytes, UA: NEGATIVE
NITRITE: NEGATIVE
PROTEIN: NEGATIVE
Specific Gravity, Urine: 1.009 (ref 1.001–1.035)
pH: 7 (ref 5.0–8.0)

## 2015-12-09 LAB — MICROALBUMIN / CREATININE URINE RATIO: CREATININE, URINE: 24 mg/dL (ref 20–320)

## 2015-12-09 LAB — HEPATIC FUNCTION PANEL
ALT: 15 U/L (ref 6–29)
AST: 19 U/L (ref 10–35)
Albumin: 3.9 g/dL (ref 3.6–5.1)
Alkaline Phosphatase: 51 U/L (ref 33–130)
BILIRUBIN DIRECT: 0.2 mg/dL (ref <=0.2)
Indirect Bilirubin: 0.5 mg/dL (ref 0.2–1.2)
Total Bilirubin: 0.7 mg/dL (ref 0.2–1.2)
Total Protein: 6 g/dL — ABNORMAL LOW (ref 6.1–8.1)

## 2015-12-09 LAB — BASIC METABOLIC PANEL WITH GFR
BUN: 9 mg/dL (ref 7–25)
CHLORIDE: 96 mmol/L — AB (ref 98–110)
CO2: 24 mmol/L (ref 20–31)
CREATININE: 0.7 mg/dL (ref 0.60–0.93)
Calcium: 8.8 mg/dL (ref 8.6–10.4)
GFR, Est African American: >89 mL/min (ref >=60)
GFR, Est Non African American: 87 mL/min (ref >=60)
Glucose, Bld: 84 mg/dL (ref 65–99)
Potassium: 4.3 mmol/L (ref 3.5–5.3)
SODIUM: 131 mmol/L — AB (ref 135–146)

## 2015-12-14 LAB — FERRITIN: Ferritin: 23 ng/mL (ref 20–288)

## 2015-12-14 LAB — VITAMIN B12: Vitamin B-12: 498 pg/mL (ref 200–1100)

## 2015-12-14 LAB — IRON: Iron: 128 ug/dL (ref 45–160)

## 2015-12-15 ENCOUNTER — Ambulatory Visit (HOSPITAL_COMMUNITY)
Admission: RE | Admit: 2015-12-15 | Discharge: 2015-12-15 | Disposition: A | Discharge status: Home / Self Care | Payer: Medicare Other | Source: Ambulatory Visit | Attending: Physician Assistant | Admitting: Physician Assistant

## 2015-12-15 DIAGNOSIS — I2699 Other pulmonary embolism without acute cor pulmonale: Secondary | ICD-10-CM | POA: Diagnosis not present

## 2015-12-15 DIAGNOSIS — E782 Mixed hyperlipidemia: Secondary | ICD-10-CM

## 2016-06-10 NOTE — Patient Instructions (Signed)

## 2016-06-10 NOTE — Progress Notes (Signed)
[facility] Unk Pinto, M.D.        Uvaldo Bristle. Silverio Lay, P.A.-C       Starlyn Skeans, P.A.-C  [facility]                [address] [place], [place] [id number] Telephone [phone] Telefax [phone] ______________________________________________________________________     This very nice and robust 76 y.o. WRF presents for  follow up with Hypertension, Hyperlipidemia, Pre-Diabetes and Vitamin D Deficiency.      Patient is monitored expectantly for elevated BP & BP has been controlled at home. Today's BP: 112/64.  In 2011, she was seen by Dr Aundra Dubin and had a 2D echo done showing only trivial MV prolapse. Patient has had no complaints of any cardiac type chest pain, palpitations, dyspnea/orthopnea/PND, dizziness, claudication, or dependent edema.     Hyperlipidemia is controlled with diet with very high HDL and low LDL. Last Lipids were at goal: Lab Results  Component Value Date   CHOL 195 12/08/2015   HDL 130 12/08/2015   LDLCALC 50 12/08/2015   TRIG 77 12/08/2015   CHOLHDL 1.5 12/08/2015      Also, the patient is screened expectantly for preDaibetes and has had no symptoms of reactive hypoglycemia, diabetic polys, paresthesias or visual blurring.  Last A1c was at goal: Lab Results  Component Value Date   HGBA1C 5.2 11/12/2013      Further, the patient also has history of Vitamin D Deficiency of 37 in 2010 and supplements vitamin D without any suspected side-effects. Last vitamin D was still low:   Lab Results  Component Value Date   VD25OH 40 11/15/2014   Current Outpatient Prescriptions on File Prior to Visit  Medication Sig  . VITAMIN C 1000 MG  Take 500 mg by mouth daily.   Marland Kitchen VITAMIN D Take 2,000 Int'l Units by mouth daily.  Marland Kitchen VITAMIN B 12 tab Take 500 mcg by mouth daily.   Marland Kitchen MAGNESIUM PO Take 500 mg by mouth daily.   . Zinc 30 MG TABS Take by mouth daily.   No Known  Allergies PMHx:   Past Medical History:  Diagnosis Date  . ABNORMAL EKG   . Anemia   . Breast cancer ([place])   . Fatigue   . Mitral valve disorders(424.0)   . Vitamin D deficiency    Immunization History  Administered Date(s) Administered  . Tdap 09/18/2009   Past Surgical History:  Procedure Laterality Date  . MASTECTOMY     FHx:    Reviewed / unchanged  SHx:    Reviewed / unchanged  Systems Review:  Constitutional: Denies fever, chills, wt changes, headaches, insomnia, fatigue, night sweats, change in appetite. Eyes: Denies redness, blurred vision, diplopia, discharge, itchy, watery eyes.  ENT: Denies discharge, congestion, post nasal drip, epistaxis, sore throat, earache, hearing loss, dental pain, tinnitus, vertigo, sinus pain, snoring.  CV: Denies chest pain, palpitations, irregular heartbeat, syncope, dyspnea, diaphoresis, orthopnea, PND, claudication or edema. Respiratory: denies cough, dyspnea, DOE, pleurisy, hoarseness, laryngitis, wheezing.  Gastrointestinal: Denies dysphagia, odynophagia, heartburn, reflux, water brash, abdominal pain or cramps, nausea, vomiting, bloating, diarrhea, constipation, hematemesis, melena, hematochezia  or hemorrhoids. Genitourinary: Denies dysuria, frequency, urgency, nocturia, hesitancy, discharge, hematuria or flank pain. Musculoskeletal: Denies arthralgias, myalgias, stiffness, jt. swelling, pain, limping or strain/sprain.  Skin: Denies pruritus, rash, hives, warts, acne, eczema or change in skin lesion(s).  Neuro: No weakness, tremor, incoordination, spasms, paresthesia or pain. Psychiatric: Denies confusion, memory loss or sensory loss. Endo: Denies change in weight, skin or hair change.  Heme/Lymph: No excessive bleeding, bruising or enlarged lymph nodes.  Physical Exam  BP 112/64   Pulse 64   Temp 97.3 F (36.3 C)   Resp 16   Ht 5' 4.5" (1.638 m)   Wt 96 lb 5.5 oz (43.7 kg)   BMI 16.28 kg/m   Appears well nourished and in  no distress.  Eyes: PERRLA, EOMs, conjunctiva no swelling or erythema. Sinuses: No frontal/maxillary tenderness ENT/Mouth: EAC's clear, TM's nl w/o erythema, bulging. Nares clear w/o erythema, swelling, exudates. Oropharynx clear without erythema or exudates. Oral hygiene is good. Tongue normal, non obstructing. Hearing intact.  Neck: Supple. Thyroid nl. Car 2+/2+ without bruits, nodes or JVD. Chest: Respirations nl withCor: Heart sounds normal w/ regular rate and rhythm with Gr 2/4 systolic murmur max at the A[place] gallops, clicks, or rubs. BS clear & equal w/o rales, rhonchi, wheezing or stridor.   Peripheral pulses normal and equal  without edema.  Abdomen: Soft & bowel sounds normal. Non-tender w/o guarding, rebound, hernias, masses, or organomegaly.  Lymphatics: Unremarkable.  Musculoskeletal: Full ROM all peripheral extremities, joint stability, 5/5 strength, and normal gait.  Skin: Warm, dry without exposed rashes, lesions or ecchymosis apparent.  Neuro: Cranial nerves intact, reflexes equal bilaterally. Sensory-motor testing grossly intact. Tendon reflexes grossly intact.  Pysch: Alert & oriented x 3.  Insight and judgement nl & appropriate. No ideations.  Assessment and Plan:   1. Elevated BP screening without diagnosis of hypertension  - Continue medication, monitor blood pressure at home.  - Continue DASH diet. Reminder to go to the ER if any CP,  SOB, nausea, dizziness, severe HA, changes vision/speech,  left arm numbness and tingling and jaw pain. - CBC with Differential/Platelet - BASIC METABOLIC PANEL WITH GFR - TSH  2. Mixed hyperlipidemia  - Continue diet/meds, exercise,& lifestyle modifications.  - Continue monitor periodic cholesterol/liver & renal functions  - Hepatic function panel - Lipid panel - TSH  3. Screening for depression  - Continue diet, exercise, lifestyle modifications.  - Monitor appropriate labs. - Hemoglobin A1c - Insulin, random  4.  Vitamin D deficiency  - Continue supplementation. - VITAMIN D 25 Hydroxy  5. Medication management  - BASIC METABOLIC PANEL WITH GFR - Hepatic function panel - Magnesium - VITAMIN D 25 Hydroxy        Recommended regular exercise, BP monitoring, weight control, and discussed med and SE's. Recommended labs to assess and monitor clinical status. Further disposition pending results of labs. Over 30 minutes of exam, counseling, chart review was performed

## 2016-06-11 ENCOUNTER — Encounter: Payer: Self-pay | Admitting: Internal Medicine

## 2016-06-11 ENCOUNTER — Ambulatory Visit (INDEPENDENT_AMBULATORY_CARE_PROVIDER_SITE_OTHER): Payer: Medicare Other | Admitting: Internal Medicine

## 2016-06-11 VITALS — BP 112/64 | HR 64 | Temp 97.3°F | Resp 16 | Ht 64.5 in | Wt 96.3 lb

## 2016-06-11 DIAGNOSIS — E559 Vitamin D deficiency, unspecified: Secondary | ICD-10-CM

## 2016-06-11 DIAGNOSIS — Z1389 Encounter for screening for other disorder: Secondary | ICD-10-CM | POA: Diagnosis not present

## 2016-06-11 DIAGNOSIS — Z79899 Other long term (current) drug therapy: Secondary | ICD-10-CM

## 2016-06-11 DIAGNOSIS — Z1331 Encounter for screening for depression: Secondary | ICD-10-CM

## 2016-06-11 DIAGNOSIS — R030 Elevated blood-pressure reading, without diagnosis of hypertension: Secondary | ICD-10-CM

## 2016-06-11 DIAGNOSIS — E782 Mixed hyperlipidemia: Secondary | ICD-10-CM

## 2016-06-11 LAB — CBC WITH DIFFERENTIAL/PLATELET
Basophils Absolute: 80 cells/uL (ref 0–200)
Basophils Relative: 2 %
EOS ABS: 40 {cells}/uL (ref 15–500)
Eosinophils Relative: 1 %
HEMATOCRIT: 35.7 % (ref 35.0–45.0)
Hemoglobin: 11.6 g/dL — ABNORMAL LOW (ref 11.7–15.5)
LYMPHS PCT: 28 %
Lymphs Abs: 1120 cells/uL (ref 850–3900)
MCH: 30.9 pg (ref 27.0–33.0)
MCHC: 32.5 g/dL (ref 32.0–36.0)
MCV: 95.2 fL (ref 80.0–100.0)
MONO ABS: 480 {cells}/uL (ref 200–950)
MONOS PCT: 12 %
MPV: 10.4 fL (ref 7.5–12.5)
NEUTROS PCT: 57 %
Neutro Abs: 2280 cells/uL (ref 1500–7800)
PLATELETS: 245 10*3/uL (ref 140–400)
RBC: 3.75 MIL/uL — ABNORMAL LOW (ref 3.80–5.10)
RDW: 12.8 % (ref 11.0–15.0)
WBC: 4 10*3/uL (ref 3.8–10.8)

## 2016-06-11 LAB — HEMOGLOBIN A1C
HEMOGLOBIN A1C: 4.7 % (ref <5.7)
Mean Plasma Glucose: 88 mg/dL

## 2016-06-11 LAB — TSH: TSH: 0.74 mIU/L

## 2016-06-12 LAB — HEPATIC FUNCTION PANEL
ALBUMIN: 4.2 g/dL (ref 3.6–5.1)
ALK PHOS: 61 U/L (ref 33–130)
ALT: 14 U/L (ref 6–29)
AST: 18 U/L (ref 10–35)
Bilirubin, Direct: 0.1 mg/dL (ref <=0.2)
Indirect Bilirubin: 0.6 mg/dL (ref 0.2–1.2)
TOTAL PROTEIN: 6.2 g/dL (ref 6.1–8.1)
Total Bilirubin: 0.7 mg/dL (ref 0.2–1.2)

## 2016-06-12 LAB — BASIC METABOLIC PANEL WITH GFR
BUN: 9 mg/dL (ref 7–25)
CALCIUM: 9.2 mg/dL (ref 8.6–10.4)
CO2: 27 mmol/L (ref 20–31)
CREATININE: 0.69 mg/dL (ref 0.60–0.93)
Chloride: 97 mmol/L — ABNORMAL LOW (ref 98–110)
GFR, Est Non African American: 87 mL/min (ref >=60)
GLUCOSE: 84 mg/dL (ref 65–99)
Potassium: 4.4 mmol/L (ref 3.5–5.3)
Sodium: 132 mmol/L — ABNORMAL LOW (ref 135–146)

## 2016-06-12 LAB — INSULIN, RANDOM: INSULIN: 24.2 u[IU]/mL — AB (ref 2.0–19.6)

## 2016-06-12 LAB — LIPID PANEL
CHOLESTEROL: 216 mg/dL — AB (ref <200)
HDL: 126 mg/dL (ref >50)
LDL Cholesterol: 72 mg/dL (ref <100)
TRIGLYCERIDES: 90 mg/dL (ref <150)
Total CHOL/HDL Ratio: 1.7 Ratio (ref <5.0)
VLDL: 18 mg/dL (ref <30)

## 2016-06-12 LAB — VITAMIN D 25 HYDROXY (VIT D DEFICIENCY, FRACTURES): VIT D 25 HYDROXY: 42 ng/mL (ref 30–100)

## 2016-06-12 LAB — MAGNESIUM: MAGNESIUM: 1.8 mg/dL (ref 1.5–2.5)

## 2016-07-02 DIAGNOSIS — H524 Presbyopia: Secondary | ICD-10-CM | POA: Diagnosis not present

## 2016-08-14 DIAGNOSIS — H26491 Other secondary cataract, right eye: Secondary | ICD-10-CM | POA: Diagnosis not present

## 2016-08-14 DIAGNOSIS — H338 Other retinal detachments: Secondary | ICD-10-CM | POA: Diagnosis not present

## 2016-08-14 DIAGNOSIS — Z961 Presence of intraocular lens: Secondary | ICD-10-CM | POA: Diagnosis not present

## 2016-08-14 DIAGNOSIS — H18413 Arcus senilis, bilateral: Secondary | ICD-10-CM | POA: Diagnosis not present

## 2016-12-14 NOTE — Progress Notes (Signed)
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  -     Lipid panel  Vitamin D deficiency -     VITAMIN D 25 Hydroxy (Vit-D Deficiency, Fractures)  Primary osteoarthritis involving multiple joints  Elevated BP without diagnosis of hypertension - continue medications, DASH diet, exercise and monitor at home. Call if greater than 130/80.  -     CBC with Differential/Platelet -     BASIC METABOLIC PANEL WITH GFR -     Hepatic function panel -     TSH -     Urinalysis, Routine w reflex microscopic -     Microalbumin / creatinine urine ratio -     EKG 12-Lead  Medication management -     CBC with Differential/Platelet -     BASIC METABOLIC PANEL WITH GFR -     Hepatic function panel -     Magnesium  Encounter for general adult medical examination with abnormal findings Will think about cologuard or colonscopy  Over 40 minutes of exam, counseling, chart review and critical decision making was performed Future Appointments Date Time Provider [place]  12/17/2017 2:00 PM Vicie Mutters, PA-C [id number] None    Subjective:  Helen Davis is a 76 y.o. female who presents CPE and 3 month follow up.    Her blood pressure has been controlled at home, today their BP is BP: 110/70 She does workout. She denies chest pain, shortness of breath, dizziness.  She is not  on cholesterol medication and denies myalgias. Her cholesterol is at goal. The cholesterol last visit was:   Lab Results  Component Value Date   CHOL 216 (H) 06/11/2016   HDL 126 06/11/2016   LDLCALC 72 06/11/2016   TRIG 90 06/11/2016   CHOLHDL 1.7 06/11/2016    Last A1C in the office was:  Lab Results  Component Value Date   HGBA1C 4.7 06/11/2016   Patient is on Vitamin D supplement.   Lab Results  Component Value Date   VD25OH 33 06/11/2016     She is retired.   Sister charlotte lives near her, there is tension, lives in apartment at [facility].  She is not going to counseling anymore. S/p double mastectomy, does do self breast exams.  She has osteoporosis and has history of lumbar compression fracture.  BMI is Body mass index is 16.56 kg/m., she is working on increasing her weight.  Wt Readings from Last 3 Encounters:  12/17/16 98 lb (44.5 kg)  06/11/16 96 lb 5.5 oz (43.7 kg)  12/08/15 96 lb 6.4 oz (43.7 kg)     Medication Review: Current Outpatient Prescriptions on File Prior to Visit  Medication Sig Dispense Refill  . Ascorbic Acid (VITAMIN C) 1000 MG tablet Take 500 mg by mouth daily.     . Cholecalciferol (VITAMIN D PO) Take 2,000 Int'l Units by mouth daily.    . Cyanocobalamin (VITAMIN B 12 PO) Take  500 mcg by mouth daily.     Marland Kitchen MAGNESIUM PO Take 500 mg by mouth daily.     . Zinc 30 MG TABS Take by mouth daily.     No current facility-administered medications on file prior to visit.     Current Problems (verified) Patient Active Problem List   Diagnosis Date Noted  . COPD (chronic obstructive pulmonary disease) ([place]) 12/09/2015  . Mild malnutrition ([place]) 12/08/2015  . Bunion of unspecified foot 07/28/2015  . DJD (degenerative joint disease) 07/28/2015  . Mixed hyperlipidemia 11/15/2014  . Osteoporosis 11/15/2014  . Anemia   . History of breast cancer   . Vitamin D deficiency   . Mitral valve disorder 10/11/2009   Screening Tests Preventative  care: Last colonoscopy: 2013  Last mammogram: 1999 s/p bilateral mastectomy Last pap smear/pelvic exam: 2013 declines another DEXA: 2013 Osteoporosis, was on fosamax in the past, declines repeat eval CXR 2017 Echo 2011  Prior vaccinations: TD or Tdap: 2011  Influenza: declines  Pneumococcal: declines Prevnar13: declines Shingles/Zostavax: declines  Names of Other Physician/Practitioners you currently use: 1. [facility] here for primary care 2. Dr. Talbert Forest, eye doctor  3. Dr. Bobby Rumpf, dentist Patient Care Team: Unk Pinto, MD as PCP - General (Internal Medicine) Darleen Crocker, MD as Consulting Physician (Ophthalmology) Richmond Campbell, MD as Consulting Physician (Gastroenterology) Druscilla Brownie, MD as Consulting Physician (Dermatology) Garald Balding, MD as Consulting Physician (Orthopedic Surgery) Unice Bailey, MD as Consulting Physician (Rheumatology) Victorino Dike, DDS as Consulting Physician (Dentistry) Larey Dresser, MD as Consulting Physician (Cardiology)  Allergies No Known Allergies  SURGICAL HISTORY She  has a past surgical history that includes Mastectomy. FAMILY HISTORY Her family history includes Cancer in her unknown relative; Hypertension in her unknown relative; Stroke in her unknown relative. SOCIAL HISTORY She  reports that she quit smoking about 44 years ago. She has never used smokeless tobacco. She reports that she drinks about 1.8 oz of alcohol per week . She reports that she does not use drugs.  Review of Systems  Constitutional: Negative.   HENT: Negative.   Eyes: Negative.   Respiratory: Negative.   Cardiovascular: Negative.   Gastrointestinal: Negative.   Genitourinary: Negative.   Musculoskeletal: Negative.   Skin: Negative.   Neurological: Negative.   Psychiatric/Behavioral: Negative.    Objective:     Blood pressure 110/70, pulse 72, temperature 97.7 F (36.5 C), resp. rate 14,  height 5' 4.5" (1.638 m), weight 98 lb (44.5 kg), SpO2 97 %. Body mass index is 16.56 kg/m.  General appearance: alert, no distress, thin appearing, WD/WN, female HEENT: normocephalic, sclerae anicteric, TMs pearly, nares patent, no discharge or erythema, pharynx normal Oral cavity: MMM, no lesions Neck: supple, no lymphadenopathy, no thyromegaly, no masses Heart: RRR, normal S1, S2, with holosystolic murmur Lungs: CTA bilaterally, no wheezes, rhonchi, or rales Abdomen: +bs, soft, non tender, non distended, no masses, no hepatomegaly, no splenomegaly Chest: s/p bilateral mastectomy without any lumps, barrel chest.  Musculoskeletal: nontender, no swelling, no obvious deformity Extremities: no edema, no cyanosis, no clubbing Pulses: 2+ symmetric, upper and lower extremities, normal cap refill Neurological: alert, oriented x 3, CN2-12 intact, strength normal upper extremities and lower extremities, sensation normal throughout, DTRs 2+ throughout, no cerebellar signs, gait normal Psychiatric: normal affect, behavior normal, pleasant    Vicie Mutters, PA-C   12/17/2016

## 2016-12-17 ENCOUNTER — Encounter: Payer: Self-pay | Admitting: Physician Assistant

## 2016-12-17 ENCOUNTER — Ambulatory Visit (INDEPENDENT_AMBULATORY_CARE_PROVIDER_SITE_OTHER): Payer: Medicare Other | Admitting: Physician Assistant

## 2016-12-17 VITALS — BP 110/70 | HR 72 | Temp 97.7°F | Resp 14 | Ht 64.5 in | Wt 98.0 lb

## 2016-12-17 DIAGNOSIS — E782 Mixed hyperlipidemia: Secondary | ICD-10-CM | POA: Diagnosis not present

## 2016-12-17 DIAGNOSIS — Z79899 Other long term (current) drug therapy: Secondary | ICD-10-CM

## 2016-12-17 DIAGNOSIS — Z136 Encounter for screening for cardiovascular disorders: Secondary | ICD-10-CM | POA: Diagnosis not present

## 2016-12-17 DIAGNOSIS — D649 Anemia, unspecified: Secondary | ICD-10-CM | POA: Diagnosis not present

## 2016-12-17 DIAGNOSIS — Z853 Personal history of malignant neoplasm of breast: Secondary | ICD-10-CM

## 2016-12-17 DIAGNOSIS — E559 Vitamin D deficiency, unspecified: Secondary | ICD-10-CM

## 2016-12-17 DIAGNOSIS — R030 Elevated blood-pressure reading, without diagnosis of hypertension: Secondary | ICD-10-CM | POA: Diagnosis not present

## 2016-12-17 DIAGNOSIS — E441 Mild protein-calorie malnutrition: Secondary | ICD-10-CM | POA: Diagnosis not present

## 2016-12-17 DIAGNOSIS — M150 Primary generalized (osteo)arthritis: Secondary | ICD-10-CM

## 2016-12-17 DIAGNOSIS — M159 Polyosteoarthritis, unspecified: Secondary | ICD-10-CM

## 2016-12-17 DIAGNOSIS — M8949 Other hypertrophic osteoarthropathy, multiple sites: Secondary | ICD-10-CM

## 2016-12-17 DIAGNOSIS — Z0001 Encounter for general adult medical examination with abnormal findings: Secondary | ICD-10-CM

## 2016-12-17 DIAGNOSIS — M810 Age-related osteoporosis without current pathological fracture: Secondary | ICD-10-CM

## 2016-12-17 DIAGNOSIS — Z0000 Encounter for general adult medical examination without abnormal findings: Secondary | ICD-10-CM | POA: Diagnosis not present

## 2016-12-17 DIAGNOSIS — I059 Rheumatic mitral valve disease, unspecified: Secondary | ICD-10-CM

## 2016-12-17 DIAGNOSIS — J449 Chronic obstructive pulmonary disease, unspecified: Secondary | ICD-10-CM

## 2016-12-17 LAB — CBC WITH DIFFERENTIAL/PLATELET
BASOS ABS: 102 {cells}/uL (ref 0–200)
Basophils Relative: 2 %
EOS PCT: 3 %
Eosinophils Absolute: 153 cells/uL (ref 15–500)
HCT: 36.9 % (ref 35.0–45.0)
Hemoglobin: 12.1 g/dL (ref 11.7–15.5)
LYMPHS PCT: 25 %
Lymphs Abs: 1275 cells/uL (ref 850–3900)
MCH: 30.8 pg (ref 27.0–33.0)
MCHC: 32.8 g/dL (ref 32.0–36.0)
MCV: 93.9 fL (ref 80.0–100.0)
MONOS PCT: 13 %
MPV: 10.3 fL (ref 7.5–12.5)
Monocytes Absolute: 663 cells/uL (ref 200–950)
NEUTROS PCT: 57 %
Neutro Abs: 2907 cells/uL (ref 1500–7800)
PLATELETS: 266 10*3/uL (ref 140–400)
RBC: 3.93 MIL/uL (ref 3.80–5.10)
RDW: 12.7 % (ref 11.0–15.0)
WBC: 5.1 10*3/uL (ref 3.8–10.8)

## 2016-12-17 LAB — BASIC METABOLIC PANEL WITH GFR
BUN: 7 mg/dL (ref 7–25)
CALCIUM: 9.4 mg/dL (ref 8.6–10.4)
CO2: 24 mmol/L (ref 20–31)
CREATININE: 0.63 mg/dL (ref 0.60–0.93)
Chloride: 97 mmol/L — ABNORMAL LOW (ref 98–110)
GFR, Est Non African American: 89 mL/min (ref >=60)
Glucose, Bld: 83 mg/dL (ref 65–99)
Potassium: 4.3 mmol/L (ref 3.5–5.3)
SODIUM: 132 mmol/L — AB (ref 135–146)

## 2016-12-17 LAB — TSH: TSH: 0.76 m[IU]/L

## 2016-12-17 LAB — HEPATIC FUNCTION PANEL
ALT: 13 U/L (ref 6–29)
AST: 18 U/L (ref 10–35)
Albumin: 4.4 g/dL (ref 3.6–5.1)
Alkaline Phosphatase: 64 U/L (ref 33–130)
BILIRUBIN DIRECT: 0.1 mg/dL (ref <=0.2)
BILIRUBIN TOTAL: 0.6 mg/dL (ref 0.2–1.2)
Indirect Bilirubin: 0.5 mg/dL (ref 0.2–1.2)
Total Protein: 6.3 g/dL (ref 6.1–8.1)

## 2016-12-17 LAB — LIPID PANEL
CHOL/HDL RATIO: 1.7 ratio (ref <5.0)
CHOLESTEROL: 220 mg/dL — AB (ref <200)
HDL: 132 mg/dL (ref >50)
LDL Cholesterol: 73 mg/dL (ref <100)
TRIGLYCERIDES: 77 mg/dL (ref <150)
VLDL: 15 mg/dL (ref <30)

## 2016-12-17 NOTE — Patient Instructions (Addendum)
Cologuard is an easy to use noninvasive colon cancer screening test based on the latest advances in stool DNA science.   Colon cancer is 3rd most diagnosed cancer and 2nd leading cause of death in both men and women 74 years of age and older despite being one of the most preventable and treatable cancers if found early.  4 of out 5 people diagnosed with colon cancer have NO prior family history.  When caught EARLY 90% of colon cancer is curable.   You have agreed to do a Cologuard screening and have declined a colonoscopy in spite of being explained the risks and benefits of the colonoscopy in detail, including cancer and death. Please understand that this is test not as sensitive or specific as a colonoscopy and you are still recommended to get a colonoscopy.    You will receive a short call from [facility] support center at [employer], when you receive a call they will say they are from [place],  to confirm your mailing address and give you more information.  When they calll you, it will appear on the caller ID as "[employer]" or in some cases only this number will appear, [phone].   [employer] will ship your collection kit directly to you. You will collect a single stool sample in the privacy of your own home, no special preparation required. You will return the kit via [place] pre-paid shipping or pick-up, in the same box it arrived in. Then I will contact you to discuss your results after I receive them from the laboratory.   If you have any questions or concerns, Cologuard Customer Support Specialist are available 24 hours a day, 7 days a week at [phone] or go to [email or web address].      [employer] with no obligation # [phone] Do not have to be a member Tues-Sat 10-6  [facility]- free test with no obligation # [phone] MUST BE A MEMBER Call for store hours  Have had patient's get good  cheaper hearing aids from [employer] The air version has good reviews.

## 2016-12-18 LAB — MAGNESIUM: Magnesium: 2 mg/dL (ref 1.5–2.5)

## 2016-12-18 LAB — URINALYSIS, ROUTINE W REFLEX MICROSCOPIC
BILIRUBIN URINE: NEGATIVE
GLUCOSE, UA: NEGATIVE
Hgb urine dipstick: NEGATIVE
KETONES UR: NEGATIVE
LEUKOCYTES UA: NEGATIVE
Nitrite: NEGATIVE
Protein, ur: NEGATIVE
SPECIFIC GRAVITY, URINE: 1.006 (ref 1.001–1.035)
pH: 7.5 (ref 5.0–8.0)

## 2016-12-18 LAB — MICROALBUMIN / CREATININE URINE RATIO
CREATININE, URINE: 26 mg/dL (ref 20–320)
Microalb, Ur: <0.2 mg/dL

## 2016-12-18 LAB — VITAMIN D 25 HYDROXY (VIT D DEFICIENCY, FRACTURES): VIT D 25 HYDROXY: 48 ng/mL (ref 30–100)

## 2016-12-18 NOTE — Progress Notes (Signed)
Pt aware of lab results & voiced understanding of those results.

## 2017-02-28 ENCOUNTER — Ambulatory Visit (INDEPENDENT_AMBULATORY_CARE_PROVIDER_SITE_OTHER): Payer: Self-pay | Admitting: Orthopaedic Surgery

## 2017-03-07 ENCOUNTER — Ambulatory Visit (INDEPENDENT_AMBULATORY_CARE_PROVIDER_SITE_OTHER): Payer: Medicare Other | Admitting: Orthopaedic Surgery

## 2017-03-07 ENCOUNTER — Encounter (INDEPENDENT_AMBULATORY_CARE_PROVIDER_SITE_OTHER): Payer: Self-pay | Admitting: Orthopaedic Surgery

## 2017-03-07 ENCOUNTER — Ambulatory Visit (INDEPENDENT_AMBULATORY_CARE_PROVIDER_SITE_OTHER): Payer: Medicare Other

## 2017-03-07 VITALS — BP 123/76 | HR 69 | Ht 64.5 in | Wt 98.0 lb

## 2017-03-07 DIAGNOSIS — G8929 Other chronic pain: Secondary | ICD-10-CM

## 2017-03-07 DIAGNOSIS — M25511 Pain in right shoulder: Secondary | ICD-10-CM

## 2017-03-07 MED ORDER — BUPIVACAINE HCL 0.5 % IJ SOLN
2.0000 mL | INTRAMUSCULAR | Status: AC | PRN
  Administered 2017-03-07: 2 mL via INTRA_ARTICULAR

## 2017-03-07 MED ORDER — LIDOCAINE HCL 1 % IJ SOLN
2.0000 mL | INTRAMUSCULAR | Status: AC | PRN
  Administered 2017-03-07: 2 mL

## 2017-03-07 MED ORDER — METHYLPREDNISOLONE ACETATE 40 MG/ML IJ SUSP
80.0000 mg | INTRAMUSCULAR | Status: AC | PRN
  Administered 2017-03-07: 80 mg

## 2017-03-07 NOTE — Progress Notes (Signed)
Office Visit Note   Patient: Helen Davis           Date of Birth: 04-Jun-1940           MRN: [id number] Visit Date: 03/07/2017              Requested by: Helen Davis, [place], [place] [address] PCP: Helen Pinto, MD   Assessment & Plan: Visit Diagnoses:  1. Chronic right shoulder pain     Plan: Impingement syndrome right shoulder with possible rotator cuff tear. Will inject subacromially with cortisone and monitor her response  Return if symptoms worsen or fail to improve.   Orders:  Orders Placed This Encounter  Procedures  . Large Joint Injection/Arthrocentesis  . XR Shoulder Right   No orders of the defined types were placed in this encounter.     Procedures: Large Joint Inj Date/Time: 03/07/2017 2:56 PM Performed by: Helen Davis Authorized by: Helen Davis   Consent Given by:  Patient Timeout: prior to procedure the correct patient, procedure, and site was verified   Indications:  Pain Location:  Shoulder Site:  L subacromial bursa Prep: patient was prepped and draped in usual sterile fashion   Needle Size:  25 G Needle Length:  1.5 inches Approach:  Lateral Ultrasound Guidance: No   Fluoroscopic Guidance: No   Arthrogram: No   Medications:  80 mg methylPREDNISolone acetate 40 MG/ML; 2 mL lidocaine 1 %; 2 mL bupivacaine 0.5 % Aspiration Attempted: No   Patient tolerance:  Patient tolerated the procedure well with no immediate complications     Clinical Data: No additional findings.   Subjective: Chief Complaint  Patient presents with  . Right Shoulder - Pain    Helen Davis is a 76 y o that presents with chronic Right shoulder pain. She has pain in the shoulder but can do overhead motion but not laterally. 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  Insidious onset of right shoulder pain months ago without history of injury or trauma. He has  been having trouble raising her arm overhead and even sleeping on that side. No neck pain or referred discomfort distally in the arm. She's had some pain along the biceps no change in appearance.   HPI  Review of Systems  Constitutional: Negative for chills, fatigue and fever.  Eyes: Negative for itching.  Respiratory: Negative for chest tightness and shortness of breath.   Cardiovascular: Negative for chest pain, palpitations and leg swelling.  Gastrointestinal: Negative for blood in stool, constipation and diarrhea.  Endocrine: Negative for polyuria.  Genitourinary: Negative for dysuria.  Musculoskeletal: Negative for back pain, joint swelling, neck pain and neck stiffness.  Allergic/Immunologic: Negative for immunocompromised state.  Neurological: Negative for dizziness and numbness.  Hematological: Does not bruise/bleed easily.  Psychiatric/Behavioral: The patient is not nervous/anxious.      Objective: Vital Signs: BP 123/76   Pulse 69   Ht 5' 4.5" (1.638 m)   Wt 98 lb (44.5 kg)   BMI 16.56 kg/m   Physical Exam  Ortho Exam awake alert noted 3. Comfortable sitting. Full range of motion of her right shoulder and overhead activity. Little limitation of the internal rotation but can't touch the middle of her back. Painful overhead arc of motion. Good strength. Some pain along the anterior aspect of her shoulder on the supraspinatus. No pain at the acromioclavicular joint. Biceps intact. Able to  lift off posteriorly   Specialty Comments:  No specialty comments available.  Imaging: No results found.   PMFS History: Patient Active Problem List   Diagnosis Date Noted  . COPD (chronic obstructive pulmonary disease) ([place]) 12/09/2015  . Mild malnutrition ([place]) 12/08/2015  . Bunion of unspecified foot 07/28/2015  . DJD (degenerative joint disease) 07/28/2015  . Mixed hyperlipidemia 11/15/2014  . Osteoporosis 11/15/2014  . Anemia   . History of breast cancer   . Vitamin D  deficiency   . 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 ABNORMAL EKG   . Anemia   . Breast cancer ([place])   . Fatigue   . Mitral valve disorders(424.0)   . Vitamin D deficiency     Family History  Problem Relation Age of Onset  . Hypertension Unknown   . Stroke Unknown   . Cancer Unknown     Past Surgical History:  Procedure Laterality Date  . MASTECTOMY     Social History   Occupational History  . Not on file.   Social History Main Topics  . Smoking status: Former Smoker    Quit date: 05/21/1972  . Smokeless tobacco: Never Used  . Alcohol use 1.8 oz/week    3 Glasses of wine per week  . Drug use: No  . Sexual activity: Not on file

## 2017-03-18 ENCOUNTER — Ambulatory Visit (INDEPENDENT_AMBULATORY_CARE_PROVIDER_SITE_OTHER): Payer: Self-pay | Admitting: Orthopaedic Surgery

## 2017-06-10 DIAGNOSIS — F4323 Adjustment disorder with mixed anxiety and depressed mood: Secondary | ICD-10-CM | POA: Diagnosis not present

## 2017-06-17 DIAGNOSIS — F4323 Adjustment disorder with mixed anxiety and depressed mood: Secondary | ICD-10-CM | POA: Diagnosis not present

## 2017-06-22 NOTE — Progress Notes (Signed)
Medicare wellness AND 3 MONTH FOLLOW UP  Assessment:   Mitral valve disorder monitor  Chronic obstructive pulmonary disease, unspecified COPD type ([place])  will get CXR, continue to avoid triggers, call if any change in symptoms, continue exercise  Osteoporosis, unspecified osteoporosis type, unspecified pathological fracture presence Declines DEXA or treatment, continue vitamin D  Anemia, unspecified type -     CBC with Differential/Platelet  History of breast cancer S/p mastectomy no need for MGM  Mild malnutrition ([place]) Add ensure -     Hepatic function panel  Mixed hyperlipidemia -continue medications, check lipids, decrease fatty foods, increase activity.  -     Lipid panel  Vitamin D deficiency -     continue supplement  Primary osteoarthritis involving multiple joints Continue walking  Elevated BP without diagnosis of hypertension - continue medications, DASH diet, exercise and monitor at home. Call if greater than 130/80.   Medication management  Wellness visit Will think about cologuard or colonscopy  Over 40 minutes of exam, counseling, chart review and critical decision making was performed Future Appointments  Date Time Provider [place]  12/17/2017  2:00 PM Vicie Mutters, PA-C [id number] None    Subjective:  Helen Davis is a 77 y.o. female who presents Medicare wellness and 3 month follow up.    Her blood pressure has been controlled at home, today their BP is BP: 116/60 She does workout. She denies chest pain, shortness of breath, dizziness.  She is not on cholesterol medication and denies myalgias. Her cholesterol is at goal. The cholesterol last visit was:   Lab Results  Component Value Date   CHOL 220 (H) 12/17/2016   HDL 132 12/17/2016   LDLCALC 73 12/17/2016   TRIG 77 12/17/2016   CHOLHDL 1.7 12/17/2016    Last A1C in the office was:  Lab Results  Component Value Date   HGBA1C 4.7 06/11/2016   Patient is on Vitamin D  supplement.   Lab Results  Component Value Date   VD25OH 48 12/17/2016     She is retired.   Sister charlotte lives near her, there is tension, lives in apartment at [facility].  She is not going to counseling anymore. S/p double mastectomy, does do self breast exams.  She has osteoporosis and has history of lumbar compression fracture.  BMI is Body mass index is 16.36 kg/m., she is working on increasing her weight.  Wt Readings from Last 3 Encounters:  06/25/17 96 lb 12.8 oz (43.9 kg)  03/07/17 98 lb (44.5 kg)  12/17/16 98 lb (44.5 kg)     Medication Review: Current Outpatient Medications on File Prior to Visit  Medication Sig Dispense Refill  . Ascorbic Acid (VITAMIN C) 1000 MG tablet Take 500 mg by mouth daily.     . Cholecalciferol (VITAMIN D PO) Take 2,000 Int'l Units by mouth daily.    . Cyanocobalamin (VITAMIN B 12 PO) Take 500 mcg by mouth daily.     Marland Kitchen MAGNESIUM PO Take 500 mg by mouth daily.     . Zinc 30 MG TABS Take by mouth daily.     No current facility-administered medications on file prior to visit.     Current Problems (verified) Patient Active Problem List   Diagnosis Date Noted  . COPD (chronic obstructive pulmonary disease) ([place]) 12/09/2015  . Mild malnutrition ([place]) 12/08/2015  . Bunion of unspecified foot 07/28/2015  . DJD (degenerative joint disease) 07/28/2015  . Mixed hyperlipidemia 11/15/2014  . Osteoporosis 11/15/2014  .  Anemia   . History of breast cancer   . Vitamin D deficiency   . 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 [facility] here for primary care 2. Dr. Talbert Forest, eye doctor  3. Dr. Bobby Rumpf, dentist Patient Care Team: Unk Pinto, MD as PCP - General (Internal Medicine) Darleen Crocker, MD as Consulting Physician (Ophthalmology) Richmond Campbell, MD as Consulting Physician (Gastroenterology) Druscilla Brownie, MD as Consulting Physician (Dermatology) Garald Balding, MD as Consulting Physician (Orthopedic Surgery) Unice Bailey, MD as Consulting Physician (Rheumatology) Victorino Dike, DDS as Consulting Physician (Dentistry) Larey Dresser, MD as Consulting Physician (Cardiology)  Allergies No Known Allergies  SURGICAL HISTORY She  has a past surgical history that includes Mastectomy. FAMILY HISTORY Her family history includes Cancer in her unknown relative; Hypertension in her unknown relative; Stroke in her unknown relative. SOCIAL HISTORY She  reports that she quit smoking about 45 years ago. she has never used smokeless tobacco. She reports that she drinks about 1.8 oz of alcohol per week. She reports that she does not use drugs.  MEDICARE WELLNESS OBJECTIVES: Physical activity: Current Exercise Habits: Home exercise routine, Type of exercise: walking, Time (Minutes): 30(2 miles a day at least, has dog), Frequency (Times/Week): 7, Weekly Exercise (Minutes/Week): 210, Intensity: Moderate Cardiac risk factors: Cardiac Risk Factors include: advanced age (>12men, >73 women);family history of premature cardiovascular disease Depression/mood screen:   Depression screen [facility] 2/9 06/25/2017  Decreased Interest 0  Down, Depressed, Hopeless 0  PHQ - 2 Score 0    ADLs:  In your present state of health, do you have any difficulty performing the following activities: 06/25/2017  Hearing? N  Vision? N  Difficulty concentrating or making decisions? N  Walking or climbing stairs? N  Dressing or bathing? N  Doing  errands, shopping? N  Some recent data might be hidden     Cognitive Testing  Alert? Yes  Normal Appearance?Yes  Oriented to person? Yes  Place? Yes   Time? Yes  Recall of three objects?  Yes  Can perform simple calculations? Yes  Displays appropriate judgment?Yes  Can read the correct time from a watch face?Yes  EOL planning: Does Patient Have a Medical Advance Directive?: Yes Type of Advance Directive: Healthcare Power of Attorney, Living will(her two sons) Does patient want to make changes to medical advance directive?: No - Patient declined Copy of [place] in Chart?: No - copy requested  Review of Systems  Constitutional: Negative.   HENT: Negative.   Eyes: Negative.   Respiratory: Negative.   Cardiovascular: Negative.   Gastrointestinal: Negative.   Genitourinary: Negative.   Musculoskeletal: Negative.   Skin: Negative.   Neurological: Negative.   Psychiatric/Behavioral: Negative.    Objective:     Blood pressure 116/60, pulse 68, temperature 97.8 F (36.6 C), temperature source Temporal, resp. rate 14, height 5' 4.5" (1.638 m), weight 96 lb 12.8 oz (43.9 kg), SpO2 98 %. Body mass index is 16.36 kg/m.  General appearance: alert, no distress, thin appearing, WD/WN, female HEENT: normocephalic, sclerae anicteric, TMs pearly, nares patent, no discharge or erythema, pharynx normal  Oral cavity: MMM, no lesions Neck: supple, no lymphadenopathy, no thyromegaly, no masses Heart: RRR, normal S1, S2, with holosystolic murmur Lungs: CTA bilaterally, no wheezes, rhonchi, or rales Abdomen: +bs, soft, non tender, non distended, no masses, no hepatomegaly, no splenomegaly Chest: s/p bilateral mastectomy without any lumps, barrel chest.  Musculoskeletal: nontender, no swelling, no obvious deformity Extremities: no edema, no cyanosis, no clubbing Pulses: 2+ symmetric, upper and lower extremities, normal cap refill Neurological: alert, oriented x 3, CN2-12  intact, strength normal upper extremities and lower extremities, sensation normal throughout, DTRs 2+ throughout, no cerebellar signs, gait normal Psychiatric: normal affect, behavior normal, pleasant    Medicare Attestation I have personally reviewed: The patient's medical and social history Their use of alcohol, tobacco or illicit drugs Their current medications and supplements The patient's functional ability including ADLs,fall risks, home safety risks, cognitive, and hearing and visual impairment Diet and physical activities Evidence for depression or mood disorders  The patient's weight, height, BMI, and visual acuity have been recorded in the chart.  I have made referrals, counseling, and provided education to the patient based on review of the above and I have provided the patient with a written personalized care plan for preventive services.     Vicie Mutters, PA-C   06/25/2017

## 2017-06-25 ENCOUNTER — Ambulatory Visit: Payer: Medicare Other | Admitting: Physician Assistant

## 2017-06-25 ENCOUNTER — Encounter: Payer: Self-pay | Admitting: Physician Assistant

## 2017-06-25 VITALS — BP 116/60 | HR 68 | Temp 97.8°F | Resp 14 | Ht 64.5 in | Wt 96.8 lb

## 2017-06-25 DIAGNOSIS — M150 Primary generalized (osteo)arthritis: Secondary | ICD-10-CM | POA: Diagnosis not present

## 2017-06-25 DIAGNOSIS — J449 Chronic obstructive pulmonary disease, unspecified: Secondary | ICD-10-CM

## 2017-06-25 DIAGNOSIS — Z0000 Encounter for general adult medical examination without abnormal findings: Secondary | ICD-10-CM

## 2017-06-25 DIAGNOSIS — E441 Mild protein-calorie malnutrition: Secondary | ICD-10-CM | POA: Diagnosis not present

## 2017-06-25 DIAGNOSIS — M8949 Other hypertrophic osteoarthropathy, multiple sites: Secondary | ICD-10-CM

## 2017-06-25 DIAGNOSIS — Z853 Personal history of malignant neoplasm of breast: Secondary | ICD-10-CM | POA: Diagnosis not present

## 2017-06-25 DIAGNOSIS — M810 Age-related osteoporosis without current pathological fracture: Secondary | ICD-10-CM

## 2017-06-25 DIAGNOSIS — R6889 Other general symptoms and signs: Secondary | ICD-10-CM

## 2017-06-25 DIAGNOSIS — R05 Cough: Secondary | ICD-10-CM

## 2017-06-25 DIAGNOSIS — Z0001 Encounter for general adult medical examination with abnormal findings: Secondary | ICD-10-CM

## 2017-06-25 DIAGNOSIS — E559 Vitamin D deficiency, unspecified: Secondary | ICD-10-CM | POA: Diagnosis not present

## 2017-06-25 DIAGNOSIS — I059 Rheumatic mitral valve disease, unspecified: Secondary | ICD-10-CM

## 2017-06-25 DIAGNOSIS — R059 Cough, unspecified: Secondary | ICD-10-CM

## 2017-06-25 DIAGNOSIS — E782 Mixed hyperlipidemia: Secondary | ICD-10-CM | POA: Diagnosis not present

## 2017-06-25 DIAGNOSIS — D649 Anemia, unspecified: Secondary | ICD-10-CM

## 2017-06-25 DIAGNOSIS — M159 Polyosteoarthritis, unspecified: Secondary | ICD-10-CM

## 2017-06-25 NOTE — Patient Instructions (Addendum)
[facility] Give them your name in radiology.   VENOUS INSUFFICIENCY Our lower leg venous system is not the most reliable, the heart does NOT pump fluid up, there is a valve system.  The muscles of the leg squeeze and the blood moves up and a valve opens and close, then they squeeze, blood moves up and valves open and closes keeping the blood moving towards the heart.  Lots can go wrong with this valve system.  If someone is sitting or standing without movement, everyone will get swelling.  THINGS TO DO:  Do not stand or sit in one position for long periods of time. Do not sit with your legs crossed. Rest with your legs raised during the day.  Your legs have to be higher than your heart so that gravity will force the valves to open, so please really elevate your legs.   Wear elastic stockings or support hose. Do not wear other tight, encircling garments around the legs, pelvis, or waist.  [employer]  has a wide variety of well priced compression stockings. [place], [place] [address] [phone] [place] has a good cheap selection, I like the socks, they are not as hard to get on  Walk as much as possible to increase blood flow.  Raise the foot of your bed at night with 2-inch blocks.  SEEK MEDICAL CARE IF:   The skin around your ankle starts to break down.  You have pain, redness, tenderness, or hard swelling developing in your leg over a vein.  You are uncomfortable due to leg pain.  If you ever have shortness of breath with exertion or chest pain go to the ER.

## 2017-09-04 ENCOUNTER — Ambulatory Visit (HOSPITAL_COMMUNITY)
Admission: RE | Admit: 2017-09-04 | Discharge: 2017-09-04 | Disposition: A | Discharge status: Home / Self Care | Payer: Medicare Other | Source: Ambulatory Visit | Attending: Physician Assistant | Admitting: Physician Assistant

## 2017-09-04 DIAGNOSIS — R05 Cough: Secondary | ICD-10-CM | POA: Diagnosis not present

## 2017-09-04 DIAGNOSIS — J449 Chronic obstructive pulmonary disease, unspecified: Secondary | ICD-10-CM | POA: Diagnosis not present

## 2017-09-04 DIAGNOSIS — R059 Cough, unspecified: Secondary | ICD-10-CM

## 2017-09-04 DIAGNOSIS — R0602 Shortness of breath: Secondary | ICD-10-CM | POA: Diagnosis not present

## 2017-09-05 ENCOUNTER — Other Ambulatory Visit: Payer: Self-pay | Admitting: Physician Assistant

## 2017-09-05 MED ORDER — BUDESONIDE-FORMOTEROL FUMARATE 160-4.5 MCG/ACT IN AERO: 2.0000 | INHALATION_SPRAY | Freq: Two times a day (BID) | RESPIRATORY_TRACT | 12 refills | Status: DC

## 2017-09-05 MED ORDER — ALBUTEROL SULFATE HFA 108 (90 BASE) MCG/ACT IN AERS: 2.0000 | INHALATION_SPRAY | Freq: Four times a day (QID) | RESPIRATORY_TRACT | 1 refills | Status: DC | PRN

## 2017-09-09 ENCOUNTER — Telehealth: Payer: Self-pay | Admitting: Internal Medicine

## 2017-09-09 NOTE — Telephone Encounter (Signed)
Patient's son Helen Davis - [employer] rep) came by the office asking if you would accept his mom as a new patient. She is a current patient of [facility]. You see Jeff's wife, [name].  Please advise.

## 2017-09-10 NOTE — Telephone Encounter (Signed)
Yes, I will accept

## 2017-09-10 NOTE — Telephone Encounter (Signed)
Informed patient's son. Will contact the office to schedule.

## 2017-09-16 NOTE — Progress Notes (Deleted)
   Assessment and Plan:    HPI 77 y.o. former smoking ( 45.3 years) WF presents for review of CXR and to start on inhaler.  She had a CXR that showed COPD changes. The disease progress was discussed in length with patient and all questions were answered.    Past Medical History:  Diagnosis Date  . ABNORMAL EKG   . Anemia   . Breast cancer ([place])   . Fatigue   . Mitral valve disorders(424.0)   . Vitamin D deficiency      No Known Allergies  Current Outpatient Medications on File Prior to Visit  Medication Sig  . albuterol (PROAIR HFA) 108 (90 Base) MCG/ACT inhaler Inhale 2 puffs into the lungs every 6 (six) hours as needed.  . Ascorbic Acid (VITAMIN C) 1000 MG tablet Take 500 mg by mouth daily.   . budesonide-formoterol (SYMBICORT) 160-4.5 MCG/ACT inhaler Inhale 2 puffs into the lungs 2 (two) times daily. Wash mouth afterwards to avoid yeast  . Cholecalciferol (VITAMIN D PO) Take 2,000 Int'l Units by mouth daily.  . Cyanocobalamin (VITAMIN B 12 PO) Take 500 mcg by mouth daily.   Marland Kitchen MAGNESIUM PO Take 500 mg by mouth daily.   . Zinc 30 MG TABS Take by mouth daily.   No current facility-administered medications on file prior to visit.     ROS: all negative except above.   Physical Exam: There were no vitals filed for this visit. LMP  (LMP Unknown)  General Appearance: Well nourished, in no apparent distress. Eyes: PERRLA, EOMs, conjunctiva no swelling or erythema Sinuses: No Frontal/maxillary tenderness ENT/Mouth: Ext aud canals clear, TMs without erythema, bulging. No erythema, swelling, or exudate on post pharynx.  Tonsils not swollen or erythematous. Hearing normal.  Neck: Supple, thyroid normal.  Respiratory: Respiratory effort normal, BS equal bilaterally without rales, rhonchi, wheezing or stridor.  Cardio: RRR with no MRGs. Brisk peripheral pulses without edema.  Abdomen: Soft, + BS.  Non tender, no guarding, rebound, hernias, masses. Lymphatics: Non tender without  lymphadenopathy.  Musculoskeletal: Full ROM, 5/5 strength, normal gait.  Skin: Warm, dry without rashes, lesions, ecchymosis.  Neuro: Cranial nerves intact. Normal muscle tone, no cerebellar symptoms. Sensation intact.  Psych: Awake and oriented X 3, normal affect, Insight and Judgment appropriate.     Vicie Mutters, PA-C 7:27 AM [facility]

## 2017-09-18 ENCOUNTER — Ambulatory Visit: Payer: Self-pay | Admitting: Physician Assistant

## 2017-10-11 DIAGNOSIS — T63441A Toxic effect of venom of bees, accidental (unintentional), initial encounter: Secondary | ICD-10-CM | POA: Diagnosis not present

## 2017-10-11 DIAGNOSIS — L989 Disorder of the skin and subcutaneous tissue, unspecified: Secondary | ICD-10-CM | POA: Diagnosis not present

## 2017-12-17 ENCOUNTER — Encounter: Payer: Self-pay | Admitting: Physician Assistant

## 2018-02-10 NOTE — Progress Notes (Signed)
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  Chronic obstructive pulmonary disease, unspecified COPD type ([place])  will get CXR, continue to avoid triggers, call if any change in symptoms, continue exercise- albuterol sent in  Osteoporosis, unspecified osteoporosis type, unspecified pathological fracture presence Willing to check DEXA, continue vitamin D  Anemia, unspecified type -     CBC with Differential/Platelet  History of breast cancer S/p mastectomy no need for MGM  Mild malnutrition ([place]) Add ensure -     Hepatic function panel  Mixed hyperlipidemia -continue medications, check lipids, decrease fatty foods, increase activity.  -     Lipid panel  Vitamin D deficiency -     continue supplement  Primary osteoarthritis involving multiple joints Continue walking  Elevated BP without diagnosis of hypertension - continue medications, DASH diet, exercise and monitor at home. Call if greater than 130/80.   Medication management  CPE Will think about cologuard or colonscopy  Over 40 minutes of exam, counseling, chart review and critical decision making was performed Future Appointments  Date Time Provider [place]  02/16/2019  2:00 PM Vicie Mutters, PA-C [id number] None    Subjective:  Helen Davis is a 77 y.o. female who presents CPE and 3 month follow up.    Her blood pressure has been controlled at home, today their BP is BP: 110/66 She does workout. She denies chest pain, shortness of breath, dizziness.  She is not on cholesterol medication and denies myalgias. Her cholesterol is at goal. The cholesterol last visit was:   Lab Results  Component Value Date   CHOL 220 (H) 12/17/2016   HDL 132 12/17/2016   LDLCALC 73 12/17/2016   TRIG 77 12/17/2016   CHOLHDL 1.7 12/17/2016    Last A1C in the office was:  Lab Results  Component Value Date   HGBA1C 4.7 06/11/2016   Patient is on Vitamin D supplement.   Lab  Results  Component Value Date   VD25OH 48 12/17/2016     She is retired.   Sister charlotte lives near her, there is tension, lives in apartment at [facility].  She is not going to counseling anymore. She has COPD, could not afford symbicort.  S/p double mastectomy, does do self breast exams.  She has osteoporosis and has history of lumbar compression fracture.  BMI is Body mass index is 16.44 kg/m., she is working on increasing her weight.  Wt Readings from Last 3 Encounters:  02/12/18 95 lb 12.8 oz (43.5 kg)  06/25/17 96 lb 12.8 oz (43.9 kg)  03/07/17 98 lb (44.5 kg)     Medication Review: Current Outpatient Medications on File Prior to Visit  Medication Sig Dispense Refill  . Ascorbic Acid (VITAMIN C) 1000 MG tablet Take 500 mg by mouth daily.     . Cholecalciferol (VITAMIN D PO) Take 2,000 Int'l Units by mouth daily.    . Cyanocobalamin (VITAMIN B 12 PO) Take 500 mcg by mouth daily.     Marland Kitchen MAGNESIUM PO Take 500 mg by mouth daily.     . Zinc 30 MG TABS Take by mouth daily.     No current facility-administered medications on file prior to visit.     Current Problems (verified) Patient Active Problem List   Diagnosis Date Noted  . COPD (chronic obstructive pulmonary disease) ([place]) 12/09/2015  . Mild malnutrition ([place]) 12/08/2015  . DJD (degenerative joint disease) 07/28/2015  . Mixed hyperlipidemia 11/15/2014  .  Osteoporosis 11/15/2014  . Anemia   . History of breast cancer   . Vitamin D deficiency   . Mitral valve disorder 10/11/2009   Screening Tests Preventative care:  Last colonoscopy: 2013  Last mammogram: 1999 s/p bilateral mastectomy Last pap smear/pelvic exam: 2013 declines another DEXA: 2013 Osteoporosis, was on fosamax in the past, declines repeat eval CXR 11/2015 Echo 2011  Prior vaccinations: TD or Tdap: 2011  Influenza: declines  Pneumococcal: declines Prevnar13: declines Shingles/Zostavax: declines  Names of Other Physician/Practitioners you  currently use: 1. [facility] here for primary care 2. Dr. Talbert Forest, eye doctor  3. Dr. Bobby Rumpf, dentist Patient Care Team: Unk Pinto, MD as PCP - General (Internal Medicine) Darleen Crocker, MD as Consulting Physician (Ophthalmology) Richmond Campbell, MD as Consulting Physician (Gastroenterology) Druscilla Brownie, MD as Consulting Physician (Dermatology) Garald Balding, MD as Consulting Physician (Orthopedic Surgery) Unice Bailey, MD as Consulting Physician (Rheumatology) Victorino Dike, DDS as Consulting Physician (Dentistry) Larey Dresser, MD as Consulting Physician (Cardiology)  Allergies No Known Allergies  SURGICAL HISTORY She  has a past surgical history that includes Mastectomy. FAMILY HISTORY Her family history includes Cancer in her unknown relative; Hypertension in her unknown relative; Stroke in her unknown relative. SOCIAL HISTORY She  reports that she quit smoking about 45 years ago. She has never used smokeless tobacco. She reports that she drinks about 3.0 standard drinks of alcohol per week. She reports that she does not use drugs.   Review of Systems  Constitutional: Negative.   HENT: Negative.   Eyes: Negative.   Respiratory: Negative.   Cardiovascular: Negative.   Gastrointestinal: Negative.   Genitourinary: Negative.   Musculoskeletal: Negative.   Skin: Negative.   Neurological: Negative.   Psychiatric/Behavioral: Negative.    Objective:     Blood pressure 110/66, pulse 75, temperature 98.8 F (37.1 C), resp. rate 14, height 5\' 4"  (1.626 m), weight 95 lb 12.8 oz (43.5 kg), SpO2 98 %. Body mass index is 16.44 kg/m.  General appearance: alert, no distress, thin appearing, WD/WN, female HEENT: normocephalic, sclerae anicteric, TMs pearly, nares patent, no discharge or erythema, pharynx normal Oral cavity: MMM, no lesions Neck: supple, no lymphadenopathy, no thyromegaly, no masses Heart: RRR, normal S1,  S2, with holosystolic murmur Lungs: CTA bilaterally, no wheezes, rhonchi, or rales Abdomen: +bs, soft, non tender, non distended, no masses, no hepatomegaly, no splenomegaly Chest: s/p bilateral mastectomy without any lumps, barrel chest.  Musculoskeletal: nontender, no swelling, no obvious deformity Extremities: no edema, no cyanosis, no clubbing Pulses: 2+ symmetric, upper and lower extremities, normal cap refill Neurological: alert, oriented x 3, CN2-12 intact, strength normal upper extremities and lower extremities, sensation normal throughout, DTRs 2+ throughout, no cerebellar signs, gait normal Psychiatric: normal affect, behavior normal, pleasant      Vicie Mutters, PA-C   02/12/2018

## 2018-02-12 ENCOUNTER — Ambulatory Visit: Payer: Medicare Other | Admitting: Physician Assistant

## 2018-02-12 ENCOUNTER — Encounter: Payer: Self-pay | Admitting: Physician Assistant

## 2018-02-12 VITALS — BP 110/66 | HR 75 | Temp 98.8°F | Resp 14 | Ht 64.0 in | Wt 95.8 lb

## 2018-02-12 DIAGNOSIS — E441 Mild protein-calorie malnutrition: Secondary | ICD-10-CM

## 2018-02-12 DIAGNOSIS — E559 Vitamin D deficiency, unspecified: Secondary | ICD-10-CM

## 2018-02-12 DIAGNOSIS — Z23 Encounter for immunization: Secondary | ICD-10-CM

## 2018-02-12 DIAGNOSIS — Z0000 Encounter for general adult medical examination without abnormal findings: Secondary | ICD-10-CM

## 2018-02-12 DIAGNOSIS — Z853 Personal history of malignant neoplasm of breast: Secondary | ICD-10-CM

## 2018-02-12 DIAGNOSIS — M8949 Other hypertrophic osteoarthropathy, multiple sites: Secondary | ICD-10-CM

## 2018-02-12 DIAGNOSIS — D649 Anemia, unspecified: Secondary | ICD-10-CM

## 2018-02-12 DIAGNOSIS — M150 Primary generalized (osteo)arthritis: Secondary | ICD-10-CM

## 2018-02-12 DIAGNOSIS — I10 Essential (primary) hypertension: Secondary | ICD-10-CM | POA: Diagnosis not present

## 2018-02-12 DIAGNOSIS — Z1389 Encounter for screening for other disorder: Secondary | ICD-10-CM

## 2018-02-12 DIAGNOSIS — E782 Mixed hyperlipidemia: Secondary | ICD-10-CM

## 2018-02-12 DIAGNOSIS — Z136 Encounter for screening for cardiovascular disorders: Secondary | ICD-10-CM | POA: Diagnosis not present

## 2018-02-12 DIAGNOSIS — J449 Chronic obstructive pulmonary disease, unspecified: Secondary | ICD-10-CM

## 2018-02-12 DIAGNOSIS — M810 Age-related osteoporosis without current pathological fracture: Secondary | ICD-10-CM

## 2018-02-12 DIAGNOSIS — Z0001 Encounter for general adult medical examination with abnormal findings: Secondary | ICD-10-CM

## 2018-02-12 DIAGNOSIS — M159 Polyosteoarthritis, unspecified: Secondary | ICD-10-CM

## 2018-02-12 DIAGNOSIS — I059 Rheumatic mitral valve disease, unspecified: Secondary | ICD-10-CM

## 2018-02-12 DIAGNOSIS — Z79899 Other long term (current) drug therapy: Secondary | ICD-10-CM

## 2018-02-12 MED ORDER — ALBUTEROL SULFATE HFA 108 (90 BASE) MCG/ACT IN AERS: 2.0000 | INHALATION_SPRAY | RESPIRATORY_TRACT | 0 refills | Status: DC | PRN

## 2018-02-12 NOTE — Patient Instructions (Addendum)
VENOUS INSUFFICIENCY Our lower leg venous system is not the most reliable, the heart does NOT pump fluid up, there is a valve system.  The muscles of the leg squeeze and the blood moves up and a valve opens and close, then they squeeze, blood moves up and valves open and closes keeping the blood moving towards the heart.  Lots can go wrong with this valve system.  If someone is sitting or standing without movement, everyone will get swelling.  THINGS TO DO:  Do not stand or sit in one position for long periods of time. Do not sit with your legs crossed. Rest with your legs raised during the day.  Your legs have to be higher than your heart so that gravity will force the valves to open, so please really elevate your legs.   Wear elastic stockings or support hose. Do not wear other tight, encircling garments around the legs, pelvis, or waist.  [employer]  has a wide variety of well priced compression stockings. [place], [place] [address] [id number] [place] has a good cheap selection, I like the socks, they are not as hard to get on  Walk as much as possible to increase blood flow.  Raise the foot of your bed at night with 2-inch blocks.  SEEK MEDICAL CARE IF:   The skin around your ankle starts to break down.  You have pain, redness, tenderness, or hard swelling developing in your leg over a vein.  You are uncomfortable due to leg pain.  If you ever have shortness of breath with exertion or chest pain go to the ER.   Breaking a bone can be serious, especially if the bone is in the hip. People who break a hip sometimes lose the ability to walk on their own. Many of them end up in a nursing home. That's why it is so important to avoid breaking a bone in the first place.   What can I do to keep my bones as healthy as possible? - You can: ?Eat foods with a lot of calcium, such as milk, yogurt, and green leafy vegetables  ?Eat foods with a lot of vitamin D,  such as milk that has vitamin D added, and fish from the ocean  ?Take calcium and vitamin D pills (if you do not get enough from the food that you eat) ?Be active for at least 30 minutes, most days of the week ?Avoid smoking  ?Limit the amount of alcohol you drink to 1 to 2 drinks a day at most  What do osteoporosis medicines do? - If you have osteoporosis or a high risk of breaking a bone, the medicines your doctor prescribes can: ?Reduce bone loss  ?Increase bone density or keep it about the same ?Reduce the chances that you will break a bone   Bisphosphonates - Most people being treated for osteoporosis take these medicines first. If they do not work well enough or cause side effects that are too hard to handle, there are other options.   Most people take one pill every week. If your doctor prescribes a bisphosphonate pill, you must take the medicine exactly as directed. If you don't, the medicine can irritate your throat or stomach. For most bisphosphonate pills, you must:  ?Take the pill first thing in the morning, before you have anything to eat or drink.  ?Drink an 8-ounce glass of water with the pill, but not eat or drink anything else for  30 minutes or 1 hour (depending on which pill you take).  ?Avoid lying down for 30 minutes after taking the pill. (You must sit or stand during that time).

## 2018-02-13 LAB — LIPID PANEL
CHOL/HDL RATIO: 2 (calc) (ref <5.0)
Cholesterol: 220 mg/dL — ABNORMAL HIGH (ref <200)
HDL: 109 mg/dL (ref >50)
LDL Cholesterol (Calc): 93 mg/dL (calc)
NON-HDL CHOLESTEROL (CALC): 111 mg/dL (ref <130)
Triglycerides: 89 mg/dL (ref <150)

## 2018-02-13 LAB — CBC WITH DIFFERENTIAL/PLATELET
Basophils Absolute: 101 cells/uL (ref 0–200)
Basophils Relative: 2.1 %
EOS PCT: 2.3 %
Eosinophils Absolute: 110 cells/uL (ref 15–500)
HEMATOCRIT: 34.5 % — AB (ref 35.0–45.0)
Hemoglobin: 11.7 g/dL (ref 11.7–15.5)
Lymphs Abs: 1320 cells/uL (ref 850–3900)
MCH: 31.7 pg (ref 27.0–33.0)
MCHC: 33.9 g/dL (ref 32.0–36.0)
MCV: 93.5 fL (ref 80.0–100.0)
MPV: 12 fL (ref 7.5–12.5)
Monocytes Relative: 10.4 %
Neutro Abs: 2770 cells/uL (ref 1500–7800)
Neutrophils Relative %: 57.7 %
PLATELETS: 162 10*3/uL (ref 140–400)
RBC: 3.69 10*6/uL — AB (ref 3.80–5.10)
RDW: 11.4 % (ref 11.0–15.0)
Total Lymphocyte: 27.5 %
WBC mixed population: 499 cells/uL (ref 200–950)
WBC: 4.8 10*3/uL (ref 3.8–10.8)

## 2018-02-13 LAB — COMPLETE METABOLIC PANEL WITH GFR
AG RATIO: 2 (calc) (ref 1.0–2.5)
ALT: 19 U/L (ref 6–29)
AST: 21 U/L (ref 10–35)
Albumin: 4.3 g/dL (ref 3.6–5.1)
Alkaline phosphatase (APISO): 58 U/L (ref 33–130)
BUN: 8 mg/dL (ref 7–25)
CALCIUM: 9.2 mg/dL (ref 8.6–10.4)
CO2: 31 mmol/L (ref 20–32)
CREATININE: 0.71 mg/dL (ref 0.60–0.93)
Chloride: 96 mmol/L — ABNORMAL LOW (ref 98–110)
GFR, EST AFRICAN AMERICAN: 97 mL/min/{1.73_m2} (ref >=60)
GFR, EST NON AFRICAN AMERICAN: 83 mL/min/{1.73_m2} (ref >=60)
Globulin: 2.1 g/dL (calc) (ref 1.9–3.7)
Glucose, Bld: 82 mg/dL (ref 65–99)
POTASSIUM: 4.2 mmol/L (ref 3.5–5.3)
SODIUM: 131 mmol/L — AB (ref 135–146)
TOTAL PROTEIN: 6.4 g/dL (ref 6.1–8.1)
Total Bilirubin: 0.6 mg/dL (ref 0.2–1.2)

## 2018-02-13 LAB — URINALYSIS, ROUTINE W REFLEX MICROSCOPIC
BILIRUBIN URINE: NEGATIVE
Glucose, UA: NEGATIVE
HGB URINE DIPSTICK: NEGATIVE
Ketones, ur: NEGATIVE
Leukocytes, UA: NEGATIVE
Nitrite: NEGATIVE
PH: 7.5 (ref 5.0–8.0)
Protein, ur: NEGATIVE
SPECIFIC GRAVITY, URINE: 1.011 (ref 1.001–1.03)

## 2018-02-13 LAB — TSH: TSH: 0.79 mIU/L (ref 0.40–4.50)

## 2018-02-13 LAB — MICROALBUMIN / CREATININE URINE RATIO
Creatinine, Urine: 22 mg/dL (ref 20–275)
MICROALB UR: 0.2 mg/dL
Microalb Creat Ratio: 9 mcg/mg creat (ref <30)

## 2018-02-13 LAB — VITAMIN D 25 HYDROXY (VIT D DEFICIENCY, FRACTURES): Vit D, 25-Hydroxy: 59 ng/mL (ref 30–100)

## 2018-02-13 LAB — MAGNESIUM: MAGNESIUM: 2 mg/dL (ref 1.5–2.5)

## 2018-02-17 DIAGNOSIS — H524 Presbyopia: Secondary | ICD-10-CM | POA: Diagnosis not present

## 2018-05-29 ENCOUNTER — Ambulatory Visit (INDEPENDENT_AMBULATORY_CARE_PROVIDER_SITE_OTHER): Payer: Self-pay

## 2018-05-29 ENCOUNTER — Ambulatory Visit (INDEPENDENT_AMBULATORY_CARE_PROVIDER_SITE_OTHER): Payer: Medicare Other | Admitting: Orthopaedic Surgery

## 2018-05-29 ENCOUNTER — Encounter (INDEPENDENT_AMBULATORY_CARE_PROVIDER_SITE_OTHER): Payer: Self-pay | Admitting: Orthopaedic Surgery

## 2018-05-29 VITALS — BP 112/70 | HR 75 | Resp 12 | Ht 64.5 in | Wt 92.0 lb

## 2018-05-29 DIAGNOSIS — M25561 Pain in right knee: Secondary | ICD-10-CM

## 2018-05-29 DIAGNOSIS — G8929 Other chronic pain: Secondary | ICD-10-CM | POA: Diagnosis not present

## 2018-05-29 MED ORDER — LIDOCAINE HCL 1 % IJ SOLN
2.0000 mL | INTRAMUSCULAR | Status: AC | PRN
  Administered 2018-05-29: 2 mL

## 2018-05-29 MED ORDER — BUPIVACAINE HCL 0.5 % IJ SOLN
2.0000 mL | INTRAMUSCULAR | Status: AC | PRN
  Administered 2018-05-29: 2 mL via INTRA_ARTICULAR

## 2018-05-29 MED ORDER — METHYLPREDNISOLONE ACETATE 40 MG/ML IJ SUSP
80.0000 mg | INTRAMUSCULAR | Status: AC | PRN
  Administered 2018-05-29: 80 mg

## 2018-05-29 NOTE — Progress Notes (Signed)
Office Visit Note   Patient: Helen Davis           Date of Birth: [birth year]           MRN: [id number] Visit Date: 05/29/2018              Requested by: Unk Pinto, [address], [place] [id number] PCP: Unk Pinto, MD   Assessment & Plan: Visit Diagnoses:  1. Chronic pain of right knee     Plan: Osteoarthritis right knee predominantly lateral compartment.  Long discussion regarding diagnosis.  Will inject with cortisone and monitor response  Follow-Up Instructions: No follow-ups on file.   Orders:  Orders Placed This Encounter  Procedures  . Large Joint Inj: R knee  . XR KNEE 3 VIEW RIGHT   No orders of the defined types were placed in this encounter.     Procedures: Large Joint Inj: R knee on 05/29/2018 12:10 PM Indications: pain and diagnostic evaluation Details: 25 G 1.5 in needle, anteromedial approach  Arthrogram: No  Medications: 2 mL lidocaine 1 %; 2 mL bupivacaine 0.5 %; 80 mg methylPREDNISolone acetate 40 MG/ML Procedure, treatment alternatives, risks and benefits explained, specific risks discussed. Consent was given by the patient. Immediately prior to procedure a time out was called to verify the correct patient, procedure, equipment, support staff and site/side marked as required. Patient was prepped and draped in the usual sterile fashion.       Clinical Data: No additional findings.   Subjective: Chief Complaint  Patient presents with  . Right Knee - Pain  Helen Davis presents in the office today with right knee pain. Patient denies injury or fall. Patient states she walks her dog everyday and has been wearing a sleeve knee brace. Patient states her right knee was injected years ago and she would like another injection. Patient states her knee "crunches" and "burns" on occassion.  Pain occasionally will wear a pullover knee support.  Episodic swelling.  Predominant pain along the lateral joint.  Has  history of multiple joint arthralgias and arthritis  HPI  Review of Systems  Constitutional: Negative for chills and fever.  HENT: Negative for tinnitus and trouble swallowing.   Eyes: Negative for pain and redness.  Respiratory: Negative for shortness of breath and wheezing.   Cardiovascular: Negative for chest pain.  Gastrointestinal: Negative for abdominal pain, blood in stool and constipation.  Endocrine: Negative for polyuria.  Genitourinary: Negative for hematuria and pelvic pain.  Musculoskeletal: Positive for joint swelling. Negative for gait problem.  Skin: Negative for rash.  Allergic/Immunologic: Negative for immunocompromised state.  Neurological: Negative for dizziness and headaches.  Hematological: Does not bruise/bleed easily.  Psychiatric/Behavioral: Negative for confusion and decreased concentration.     Objective: Vital Signs: BP 112/70 (BP Location: Left Arm, Patient Position: Sitting, Cuff Size: Normal)   Pulse 75   Resp 12   Ht 5' 4.5" (1.638 m)   Wt 92 lb (41.7 kg)   LMP  (LMP Unknown)   BMI 15.55 kg/m   Physical Exam Constitutional:      Appearance: She is well-developed.  Eyes:     Pupils: Pupils are equal, round, and reactive to light.  Pulmonary:     Effort: Pulmonary effort is normal.  Skin:    General: Skin is warm and dry.  Neurological:     Mental Status: She is alert and oriented to person, place, and time.  Psychiatric:  Behavior: Behavior normal.     Ortho Exam awake alert and oriented x3.  Comfortable sitting.  Increased valgus with weightbearing.  Very minimal effusion.  Some patellar crepitation.  Predominately lateral joint pain.  No instability.  No calf discomfort.  No popliteal mass or pain.  No pain with range of motion of right hip.  Straight leg raise negative.  Neurologically intact  Specialty Comments:  No specialty comments available.  Imaging: Xr Knee 3 View Right  Result Date: 05/29/2018 Films of the right  knee were obtained in 3 projections standing.  There are tricompartmental degenerative changes predominate in the lateral compartment where there is narrowing, increased valgus and irregularity surface.  There are peripheral osteophytes and some subchondral sclerosis no acute changes or ectopic calcification    PMFS History: Patient Active Problem List   Diagnosis Date Noted  . COPD (chronic obstructive pulmonary disease) ([place]) 12/09/2015  . Mild malnutrition ([place]) 12/08/2015  . DJD (degenerative joint disease) 07/28/2015  . Mixed hyperlipidemia 11/15/2014  . Osteoporosis 11/15/2014  . Anemia   . History of breast cancer   . Vitamin D deficiency   . 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 ABNORMAL EKG   . Anemia   . Breast cancer ([place])   . Fatigue   . Mitral valve disorders(424.0)   . Vitamin D deficiency     Family History  Problem Relation Age of Onset  . Hypertension Other   . Stroke Other   . Cancer Other     Past Surgical History:  Procedure Laterality Date  . MASTECTOMY     Social History   Occupational History  . Not on file  Tobacco Use  . Smoking status: Former Smoker    Last attempt to quit: 05/21/1972    Years since quitting: 46.0  . Smokeless tobacco: Never Used  Substance and Sexual Activity  . Alcohol use: Yes    Alcohol/week: 3.0 standard drinks    Types: 3 Glasses of wine per week  . Drug use: No  . Sexual activity: Not on file

## 2018-07-19 IMAGING — CR DG CHEST 2V
2 series · 2 of 2 positions shown · non-contrast
Comparison: 12/15/2015

CLINICAL DATA: Increased cough and congestion since this past
winter, some shortness of breath, history COPD, breast cancer,
former smoker

EXAM:
CHEST - 2 VIEW

[chest pa]
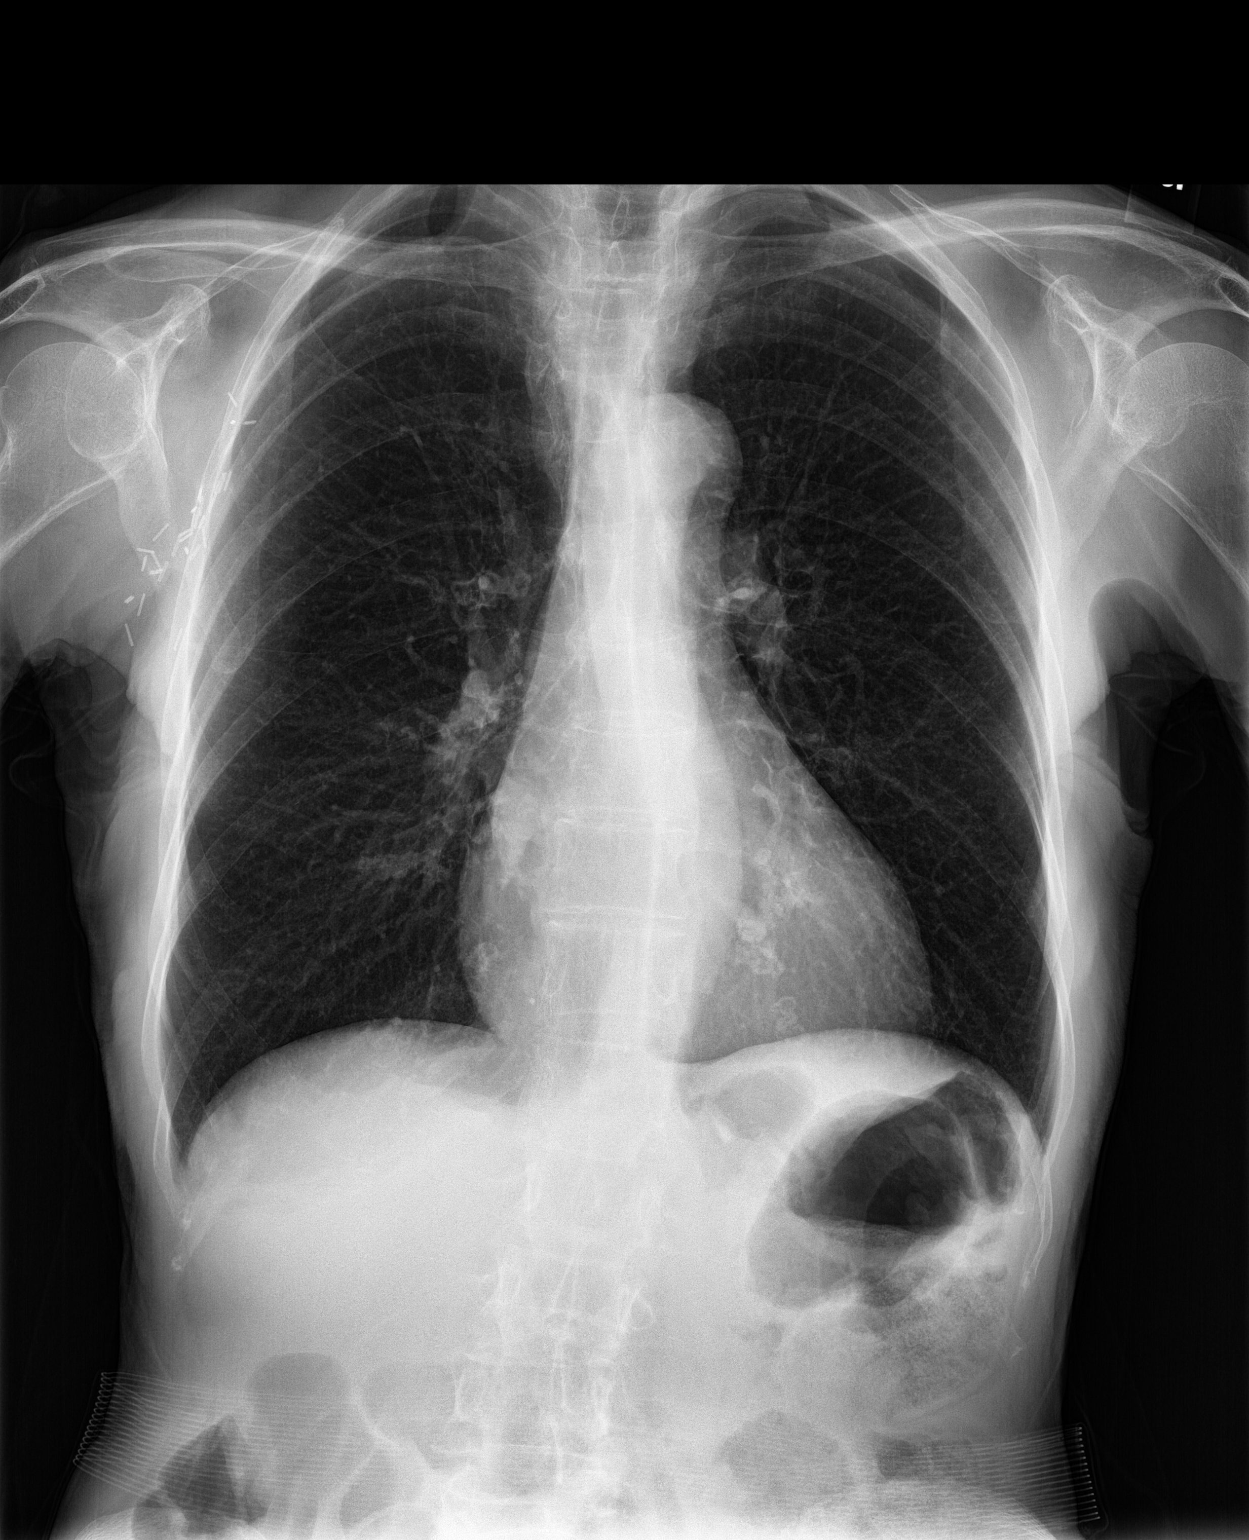

[chest lat]
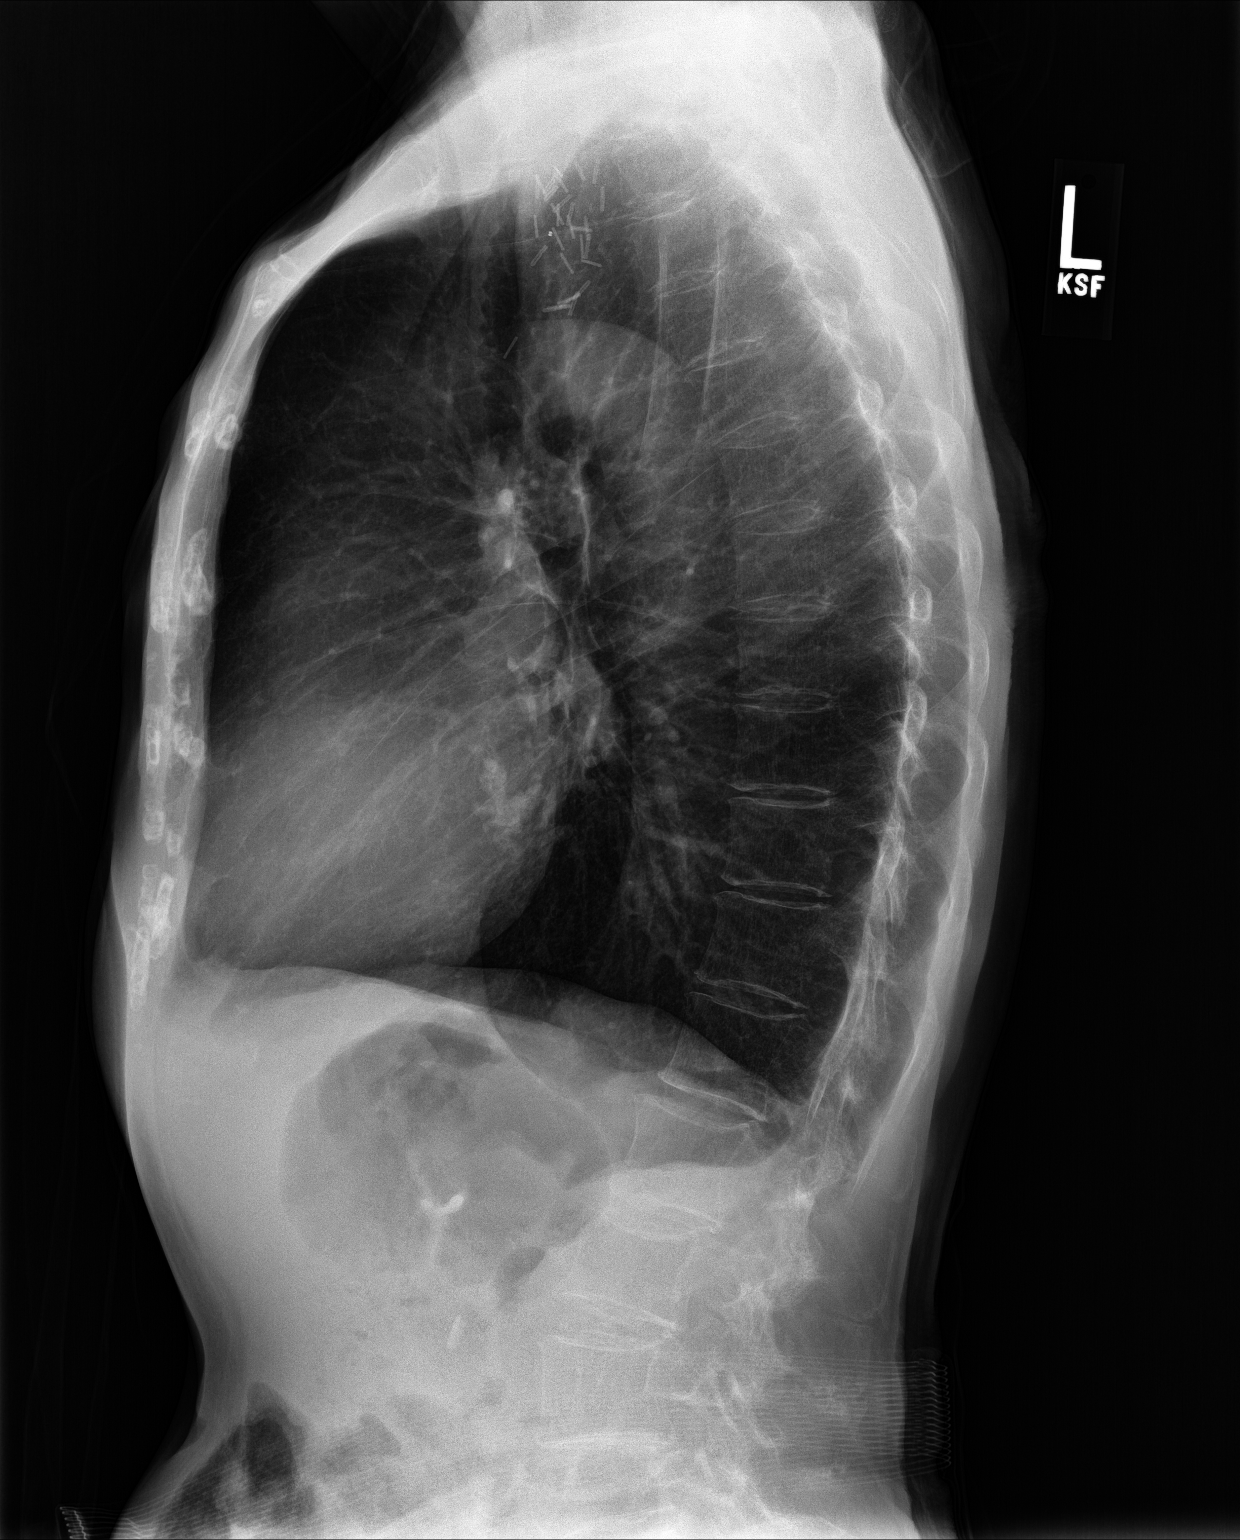

[2 of 2 positions shown; findings below may reference images not displayed]

FINDINGS: Upper normal heart size.

Mediastinal contours and pulmonary vascularity normal.

Emphysematous and minimal bronchitic changes consistent with COPD.

No acute infiltrate, pleural effusion or pneumothorax.

Diffuse osseous demineralization.

Prior BILATERAL mastectomies.

Prior RIGHT axillary lymph node dissection.
IMPRESSION: COPD changes.

No acute abnormalities.

## 2018-08-26 NOTE — Progress Notes (Signed)
MEDICARE WELNESS AND 3 MONTH FOLLOW UP  THIS ENCOUNTER IS A VIRTUAL/TELEPHONE VISIT DUE TO COVID-19 - PATIENT WAS NOT SEEN IN THE OFFICE.  PATIENT HAS CONSENTED TO VIRTUAL VISIT / TELEMEDICINE VISIT  This provider placed a call to Fredric Dine using telephone, her appointment was changed to a virtual office visit to reduce the risk of exposure to the COVID-19 virus and to help remain healthy and safe. The virtual visit will also provide continuity of care. She verbalizes understanding.    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   Vitamin D deficiency -     continue supplement  Primary osteoarthritis involving multiple joints Continue walking  Elevated BP without diagnosis of hypertension - continue medications, DASH diet, exercise and monitor at home. Call if greater than 130/80.   Medication management  MEDICARE WELLNESS 1 year   Over 40 minutes of exam, counseling, chart review and critical decision making was performed Future Appointments  Date Time Provider [place]  02/16/2019  2:00 PM Vicie Mutters, PA-C [facility] None    Plan:   During the course of the visit the patient was educated and counseled about appropriate screening and preventive services including:    Pneumococcal vaccine   Prevnar 13  Influenza vaccine  Td vaccine  Screening electrocardiogram  Bone densitometry screening  Colorectal cancer  screening  Diabetes screening  Glaucoma screening  Nutrition counseling   Advanced directives: requested   Subjective:  Helen Davis is a 78 y.o. female who presents medicare wellness and 3 month follow up.   She is retired.   Sister charlotte lives near her, lives in apartment at [facility]. She gets out with her dog, she will walk several miles with her dog if it is not above 80.    Her blood pressure has been controlled at home, today their BP is BP: 111/68 She does workout. She denies chest pain, shortness of breath, dizziness.  BMI is Body mass index is 16.05 kg/m. Wt Readings from Last 3 Encounters:  08/27/18 95 lb (43.1 kg)  05/29/18 92 lb (41.7 kg)  02/12/18 95 lb 12.8 oz (43.5 kg)   She is not on cholesterol medication and denies myalgias. Her cholesterol is at goal. The cholesterol last visit was:   Lab Results  Component Value Date   CHOL 220 (H) 02/12/2018   HDL 109 02/12/2018   LDLCALC 93 02/12/2018   TRIG 89 02/12/2018   CHOLHDL 2.0 02/12/2018    Last A1C in the office was:  Lab Results  Component Value Date   HGBA1C 4.7 06/11/2016   Patient is on Vitamin D supplement, she is on 4000   Lab Results  Component Value Date   VD25OH 108 02/12/2018     She is not going to counseling anymore. She has COPD, could not afford symbicort, she does have albuterol but is not taking it.  S/p double mastectomy, does do self breast exams.  She has osteoporosis and has history of lumbar compression fracture.  BMI is Body mass index is 16.05 kg/m.,  she is working on increasing her weight.  Wt Readings from Last 3 Encounters:  08/27/18 95 lb (43.1 kg)  05/29/18 92 lb (41.7 kg)  02/12/18 95 lb 12.8 oz (43.5 kg)     Medication Review: Current Outpatient Medications on File Prior to Visit  Medication Sig Dispense Refill  . albuterol (VENTOLIN HFA) 108 (90 Base) MCG/ACT inhaler Inhale 2 puffs into the lungs every 4 (four) hours as needed for wheezing or shortness  of breath. 1 Inhaler 0  . Ascorbic Acid (VITAMIN C) 1000 MG tablet Take 500 mg by mouth daily.     . Cholecalciferol (VITAMIN D PO) Take 2,000 Int'l Units by mouth daily.    . Cyanocobalamin (VITAMIN B 12 PO) Take 500 mcg by mouth daily.     Marland Kitchen MAGNESIUM PO Take 500 mg by mouth daily.     . Zinc 30 MG TABS Take by mouth daily.     No current facility-administered medications on file prior to visit.     Current Problems (verified) Patient Active Problem List   Diagnosis Date Noted  . COPD (chronic obstructive pulmonary disease) ([place]) 12/09/2015  . Mild malnutrition ([place]) 12/08/2015  . DJD (degenerative joint disease) 07/28/2015  . Mixed hyperlipidemia 11/15/2014  . Osteoporosis 11/15/2014  . Anemia   . History of breast cancer   . Vitamin D deficiency   . Mitral valve disorder 10/11/2009   Screening Tests Preventative care:  Last colonoscopy: 2013  Last mammogram: 1999 s/p bilateral mastectomy Last pap smear/pelvic exam: 2013 declines another DEXA: 2013 Osteoporosis, was on fosamax in the past, declines repeat eval CXR 11/2015 Echo 2011 CXR 08/2017  Prior vaccinations: TD or Tdap: 2011  Influenza: declines  Pneumococcal: declines Prevnar13: declines Shingles/Zostavax: declines  Names of Other Physician/Practitioners you currently use: 1. [facility] here for primary care 2. Dr. Zannie Cove, eye doctor [phone] 3. Dr. Bretta Bang, dentist 2019 Patient Care Team: Unk Pinto, MD as PCP - General (Internal Medicine) Darleen Crocker, MD as Consulting Physician (Ophthalmology) Richmond Campbell, MD as Consulting Physician (Gastroenterology) Druscilla Brownie, MD as Consulting Physician (Dermatology) Garald Balding, MD as Consulting Physician (Orthopedic Surgery) Unice Bailey, MD as Consulting Physician (Rheumatology) Victorino Dike, DDS as Consulting Physician (Dentistry) Larey Dresser, MD as Consulting Physician  (Cardiology)  Allergies No Known Allergies  SURGICAL HISTORY She  has a past surgical history that includes Mastectomy. FAMILY HISTORY Her family history includes Cancer in an other family member; Hypertension in an other family member; Stroke in an other family member. SOCIAL HISTORY She  reports that she quit smoking about 46 years ago. She has never used smokeless tobacco. She reports current alcohol use of about 3.0 standard drinks of alcohol per week. She reports that she does not use drugs.  MEDICARE WELLNESS OBJECTIVES: Physical activity: Current Exercise Habits: Home exercise routine, Type of exercise: walking, Time (Minutes): 50, Frequency (Times/Week): 7, Weekly Exercise (Minutes/Week): 350, Intensity: Mild, Exercise limited by: None identified Cardiac risk factors: Cardiac Risk Factors include: advanced age (>11men, >80 women);family history of premature cardiovascular disease Depression/mood screen:   Depression screen [facility] 2/9 08/27/2018  Decreased Interest 0  Down, Depressed, Hopeless 0  PHQ - 2 Score 0    ADLs:  In your present state of health, do you have any difficulty performing the following activities: 08/27/2018  Hearing? N  Vision? N  Difficulty concentrating or making decisions? N  Walking or climbing stairs? N  Dressing or bathing? N  Doing errands,  shopping? N  Some recent data might be hidden     Cognitive Testing  Alert? Yes  Normal Appearance?Yes  Oriented to person? Yes  Place? Yes   Time? Yes  Recall of three objects?  Yes  Can perform simple calculations? Yes  Displays appropriate judgment?Yes  Can read the correct time from a watch face?Yes  EOL planning: Does Patient Have a Medical Advance Directive?: Yes Type of Advance Directive: Healthcare Power of Attorney, Living will Copy of [place] in Chart?: No - copy requested  Review of Systems  Constitutional: Negative.   HENT: Negative.   Eyes: Negative.   Respiratory:  Negative.   Cardiovascular: Negative.   Gastrointestinal: Negative.   Genitourinary: Negative.   Musculoskeletal: Negative.   Skin: Negative.   Neurological: Negative.   Psychiatric/Behavioral: Negative.    Objective:     Blood pressure 111/68, pulse 62, height 5' 4.5" (1.638 m), weight 95 lb (43.1 kg). Body mass index is 16.05 kg/m.  General Appearance:Well sounding, in no apparent distress.  ENT/Mouth: No hoarseness, No cough for duration of visit.  Respiratory: completing full sentences without distress, without audible wheeze Neuro: Awake and oriented X 3,  Psych:  Insight and Judgment appropriate.    Medicare Attestation I have personally reviewed: The patient's medical and social history Their use of alcohol, tobacco or illicit drugs Their current medications and supplements The patient's functional ability including ADLs,fall risks, home safety risks, cognitive, and hearing and visual impairment Diet and physical activities Evidence for depression or mood disorders  The patient's weight, height, BMI, and visual acuity have been recorded in the chart.  I have made referrals, counseling, and provided education to the patient based on review of the above and I have provided the patient with a written personalized care plan for preventive services.      Vicie Mutters, PA-C   08/27/2018

## 2018-08-27 ENCOUNTER — Other Ambulatory Visit: Payer: Self-pay

## 2018-08-27 ENCOUNTER — Ambulatory Visit: Payer: Medicare Other | Admitting: Physician Assistant

## 2018-08-27 ENCOUNTER — Encounter: Payer: Self-pay | Admitting: Physician Assistant

## 2018-08-27 VITALS — BP 111/68 | HR 62 | Ht 64.5 in | Wt 95.0 lb

## 2018-08-27 DIAGNOSIS — E782 Mixed hyperlipidemia: Secondary | ICD-10-CM | POA: Diagnosis not present

## 2018-08-27 DIAGNOSIS — M810 Age-related osteoporosis without current pathological fracture: Secondary | ICD-10-CM

## 2018-08-27 DIAGNOSIS — E441 Mild protein-calorie malnutrition: Secondary | ICD-10-CM

## 2018-08-27 DIAGNOSIS — Z853 Personal history of malignant neoplasm of breast: Secondary | ICD-10-CM

## 2018-08-27 DIAGNOSIS — D649 Anemia, unspecified: Secondary | ICD-10-CM

## 2018-08-27 DIAGNOSIS — R6889 Other general symptoms and signs: Secondary | ICD-10-CM | POA: Diagnosis not present

## 2018-08-27 DIAGNOSIS — M150 Primary generalized (osteo)arthritis: Secondary | ICD-10-CM

## 2018-08-27 DIAGNOSIS — I059 Rheumatic mitral valve disease, unspecified: Secondary | ICD-10-CM

## 2018-08-27 DIAGNOSIS — J449 Chronic obstructive pulmonary disease, unspecified: Secondary | ICD-10-CM

## 2018-08-27 DIAGNOSIS — Z0000 Encounter for general adult medical examination without abnormal findings: Secondary | ICD-10-CM

## 2018-08-27 DIAGNOSIS — Z0001 Encounter for general adult medical examination with abnormal findings: Secondary | ICD-10-CM

## 2018-08-27 DIAGNOSIS — M159 Polyosteoarthritis, unspecified: Secondary | ICD-10-CM

## 2018-08-27 DIAGNOSIS — M8949 Other hypertrophic osteoarthropathy, multiple sites: Secondary | ICD-10-CM

## 2018-08-27 DIAGNOSIS — E559 Vitamin D deficiency, unspecified: Secondary | ICD-10-CM

## 2018-12-31 NOTE — Progress Notes (Signed)
Assessment and Plan:  Tonjua was seen today for acute visit.  Diagnoses and all orders for this visit:  Blood loss -     CBC with Differential/Platelet -     COMPLETE METABOLIC PANEL WITH GFR Concern for amount of blood loss, pt asymptomatic.  Ruptured varicose vein Discussed covering until completely healed Monitor area and surround skin Discussed S&S of infection and when to contact office vs EMS. Discussed compression stockings Follow up with [facility]    Patient agreeable to plan of care.  Contact office with any new or worsening symptoms.  Further disposition pending results of labs. Discussed med's effects and SE's.   Over 30 minutes of exam, counseling, chart review, and critical decision making was performed.   Future Appointments  Date Time Provider [place]  01/01/2019 11:30 AM Garnet Sierras, NP [facility] None  02/04/2019  1:00 PM [facility] VASC 2 - [facility]  02/04/2019  1:40 PM Elam Dutch, MD [facility]  02/16/2019  2:00 PM Vicie Mutters, PA-C [facility] None  09/02/2019  2:00 PM Vicie Mutters, PA-C [facility] None    ------------------------------------------------------------------------------------------------------------------   HPI 78 y.o.female presents for evaluation for varicose vein.  Report that five days ago she was in the shower and rubbed the vein with a washcloth and it began bleeding and she could not get it to stop. She reports it saturated two full bath towels, several wash clothes and a bathmat   Reports her blood pressure remained in normal range.  She did not go to the hospital as the bleeding finally stopped.   She did called EM for assistance with this.  Reports her blood pressure remained in normal range.  They bandaged the area with gauze and she has not changed since.  She has not noticed any breakthrough bleeding and denies any pain.  [facility] but they were not able to see them related to  insurance.  Now she has an appointment February 04, 2019 with [facility] for evaluation.     Past Medical History:  Diagnosis Date  . ABNORMAL EKG   . Anemia   . Breast cancer ([place])   . Fatigue   . Mitral valve disorders(424.0)   . Vitamin D deficiency      No Known Allergies  Current Outpatient Medications on File Prior to Visit  Medication Sig  . albuterol (VENTOLIN HFA) 108 (90 Base) MCG/ACT inhaler Inhale 2 puffs into the lungs every 4 (four) hours as needed for wheezing or shortness of breath.  . Ascorbic Acid (VITAMIN C) 1000 MG tablet Take 500 mg by mouth daily.   . Cholecalciferol (VITAMIN D PO) Take 2,000 Int'l Units by mouth daily.  . Cyanocobalamin (VITAMIN B 12 PO) Take 500 mcg by mouth daily.   Marland Kitchen MAGNESIUM PO Take 500 mg by mouth daily.   . Zinc 30 MG TABS Take by mouth daily.   No current facility-administered medications on file prior to visit.     ROS: all negative except above.   Physical Exam:  General Appearance: Well nourished, thin, in no apparent distress. Eyes: PERRLA, EOMs, conjunctiva no swelling or erythema ENT/Mouth: Ext aud canals clear, TMs without erythema, bulging. No erythema, swelling, or exudate on post pharynx.  Tonsils not swollen or erythematous. Hearing normal.  Respiratory: Respiratory effort normal, BS equal bilaterally without rales, rhonchi, wheezing or stridor.  Cardio: RRR with no MRGs. Brisk peripheral pulses without edema.  Abdomen: Soft, +  BS.  Non tender, no guarding, rebound, hernias, masses. Musculoskeletal: Full ROM, 5/5 strength, normal gait.  Skin: Warm, dry without rashes, ecchymosis. Clean gauze dressing to right lower extremity.  Removed, mild edema noted above bandage.  Single area of rough dark exudate noted, size of tip of pen. No open areas, surrounding skin no erythema noted. Cleansed and dry telfa applied for protection of scab. Neuro: Cranial nerves intact. Normal muscle tone, no  cerebellar symptoms. Sensation intact.  Psych: Awake and oriented X 3, normal affect, Insight and Judgment appropriate.     Garnet Sierras, NP 11:52 PM [facility]

## 2019-01-01 ENCOUNTER — Encounter: Payer: Self-pay | Admitting: Adult Health Nurse Practitioner

## 2019-01-01 ENCOUNTER — Other Ambulatory Visit: Payer: Self-pay

## 2019-01-01 ENCOUNTER — Ambulatory Visit (INDEPENDENT_AMBULATORY_CARE_PROVIDER_SITE_OTHER): Payer: Medicare Other | Admitting: Adult Health Nurse Practitioner

## 2019-01-01 VITALS — BP 120/70 | HR 85 | Temp 97.5°F | Wt 97.4 lb

## 2019-01-01 DIAGNOSIS — R58 Hemorrhage, not elsewhere classified: Secondary | ICD-10-CM | POA: Diagnosis not present

## 2019-01-01 DIAGNOSIS — I8390 Asymptomatic varicose veins of unspecified lower extremity: Secondary | ICD-10-CM

## 2019-01-01 DIAGNOSIS — I83899 Varicose veins of unspecified lower extremities with other complications: Secondary | ICD-10-CM

## 2019-01-01 NOTE — Patient Instructions (Signed)
We are checking labs today and will call you with the results on Monday.  Keep the area to the left outer leg covered to prevent accidentally re-opening the area.  If the bandage gets wet, please remove and change.  Monitor your skin for any increased redness, drainage, pain, pain with walking.  Use compression stockings to help with circulation.   Follow up with Vascular in September.  Seek immediate attention for uncontrolled bleeding.

## 2019-01-02 LAB — COMPLETE METABOLIC PANEL WITH GFR
AG Ratio: 1.8 (calc) (ref 1.0–2.5)
ALT: 16 U/L (ref 6–29)
AST: 21 U/L (ref 10–35)
Albumin: 4 g/dL (ref 3.6–5.1)
Alkaline phosphatase (APISO): 58 U/L (ref 37–153)
BUN: 11 mg/dL (ref 7–25)
CO2: 26 mmol/L (ref 20–32)
Calcium: 9.3 mg/dL (ref 8.6–10.4)
Chloride: 98 mmol/L (ref 98–110)
Creat: 0.82 mg/dL (ref 0.60–0.93)
GFR, Est African American: 81 mL/min/{1.73_m2} (ref >=60)
GFR, Est Non African American: 70 mL/min/{1.73_m2} (ref >=60)
Globulin: 2.2 g/dL (calc) (ref 1.9–3.7)
Glucose, Bld: 88 mg/dL (ref 65–99)
Potassium: 3.9 mmol/L (ref 3.5–5.3)
Sodium: 132 mmol/L — ABNORMAL LOW (ref 135–146)
Total Bilirubin: 0.8 mg/dL (ref 0.2–1.2)
Total Protein: 6.2 g/dL (ref 6.1–8.1)

## 2019-01-02 LAB — CBC WITH DIFFERENTIAL/PLATELET
Absolute Monocytes: 610 cells/uL (ref 200–950)
Basophils Absolute: 60 cells/uL (ref 0–200)
Basophils Relative: 1.2 %
Eosinophils Absolute: 50 cells/uL (ref 15–500)
Eosinophils Relative: 1 %
HCT: 34.4 % — ABNORMAL LOW (ref 35.0–45.0)
Hemoglobin: 11.3 g/dL — ABNORMAL LOW (ref 11.7–15.5)
Lymphs Abs: 1070 cells/uL (ref 850–3900)
MCH: 31 pg (ref 27.0–33.0)
MCHC: 32.8 g/dL (ref 32.0–36.0)
MCV: 94.5 fL (ref 80.0–100.0)
MPV: 11.8 fL (ref 7.5–12.5)
Monocytes Relative: 12.2 %
Neutro Abs: 3210 cells/uL (ref 1500–7800)
Neutrophils Relative %: 64.2 %
Platelets: 188 10*3/uL (ref 140–400)
RBC: 3.64 10*6/uL — ABNORMAL LOW (ref 3.80–5.10)
RDW: 11.8 % (ref 11.0–15.0)
Total Lymphocyte: 21.4 %
WBC: 5 10*3/uL (ref 3.8–10.8)

## 2019-01-04 ENCOUNTER — Encounter: Payer: Self-pay | Admitting: Adult Health Nurse Practitioner

## 2019-02-03 ENCOUNTER — Telehealth (HOSPITAL_COMMUNITY): Payer: Self-pay | Admitting: Rehabilitation

## 2019-02-03 ENCOUNTER — Other Ambulatory Visit: Payer: Self-pay

## 2019-02-03 DIAGNOSIS — I83891 Varicose veins of right lower extremities with other complications: Secondary | ICD-10-CM

## 2019-02-03 NOTE — Telephone Encounter (Signed)

## 2019-02-04 ENCOUNTER — Ambulatory Visit (HOSPITAL_COMMUNITY)
Admission: RE | Admit: 2019-02-04 | Discharge: 2019-02-04 | Disposition: A | Discharge status: Home / Self Care | Payer: Medicare Other | Source: Ambulatory Visit | Attending: Family | Admitting: Family

## 2019-02-04 ENCOUNTER — Encounter: Payer: Self-pay | Admitting: Vascular Surgery

## 2019-02-04 ENCOUNTER — Other Ambulatory Visit: Payer: Self-pay

## 2019-02-04 ENCOUNTER — Ambulatory Visit: Payer: Medicare Other | Admitting: Vascular Surgery

## 2019-02-04 VITALS — BP 122/70 | HR 69 | Resp 14 | Ht 64.5 in | Wt 97.0 lb

## 2019-02-04 DIAGNOSIS — I83811 Varicose veins of right lower extremities with pain: Secondary | ICD-10-CM | POA: Diagnosis not present

## 2019-02-04 DIAGNOSIS — I83891 Varicose veins of right lower extremities with other complications: Secondary | ICD-10-CM | POA: Insufficient documentation

## 2019-02-04 NOTE — Progress Notes (Signed)
Patient name: Helen Davis MRN: [id number] DOB: 12/23/1940 Sex: female  REASON FOR CONSULT: Bleeding right varicose vein  HPI: Helen Davis is a 78 y.o. female, who about 1 month ago had a bleeding episode from a varicosity near her right ankle.  This was fairly dramatic but was stopped with direct pressure.  She ruined 2 towels with the bleeding.  She has previously had stripping of her right greater saphenous vein many years ago.  She is slowly had recurrence of symptoms.  She is not currently wearing compression stockings.  She has no history of DVT.  She does have family history of varicose veins.  Both of her sons have varicose veins.  She does describe feelings of heaviness fullness and aching in her leg on occasion.  Past Medical History:  Diagnosis Date  . ABNORMAL EKG   . Anemia   . Breast cancer ([place])   . Fatigue   . Mitral valve disorders(424.0)   . Vitamin D deficiency    Past Surgical History:  Procedure Laterality Date  . MASTECTOMY      Family History  Problem Relation Age of Onset  . Hypertension Other   . Stroke Other   . Cancer Other     SOCIAL HISTORY: Social History   Socioeconomic History  . Marital status: Single    Spouse name: Not on file  . Number of children: Not on file  . Years of education: Not on file  . Highest education level: Not on file  Occupational History  . Not on file  Social Needs  . Financial resource strain: Not on file  . Food insecurity    Worry: Not on file    Inability: Not on file  . Transportation needs    Medical: Not on file    Non-medical: Not on file  Tobacco Use  . Smoking status: Former Smoker    Quit date: 05/21/1972    Years since quitting: 46.7  . Smokeless tobacco: Never Used  Substance and Sexual Activity  . Alcohol use: Yes    Alcohol/week: 3.0 standard drinks    Types: 3 Glasses of wine per week  . Drug use: No  . Sexual activity: Not on file  Lifestyle  . Physical activity    Days per  week: Not on file    Minutes per session: Not on file  . Stress: Not on file  Relationships  . Social Herbalist on phone: Not on file    Gets together: Not on file    Attends religious service: Not on file    Active member of club or organization: Not on file    Attends meetings of clubs or organizations: Not on file    Relationship status: Not on file  . Intimate partner violence    Fear of current or ex partner: Not on file    Emotionally abused: Not on file    Physically abused: Not on file    Forced sexual activity: Not on file  Other Topics Concern  . Not on file  Social History Narrative  . Not on file    No Known Allergies  Current Outpatient Medications  Medication Sig Dispense Refill  . albuterol (VENTOLIN HFA) 108 (90 Base) MCG/ACT inhaler Inhale 2 puffs into the lungs every 4 (four) hours as needed for wheezing or shortness of breath. 1 Inhaler 0  . Ascorbic Acid (VITAMIN C) 1000 MG tablet Take 500 mg by mouth daily.     Marland Kitchen  Cholecalciferol (VITAMIN D PO) Take 2,000 Int'l Units by mouth daily.    . Cyanocobalamin (VITAMIN B 12 PO) Take 500 mcg by mouth daily.     Marland Kitchen MAGNESIUM PO Take 500 mg by mouth daily.     . Zinc 30 MG TABS Take by mouth daily.     No current facility-administered medications for this visit.     ROS:   General:  No weight loss, Fever, chills  HEENT: No recent headaches, no nasal bleeding, no visual changes, no sore throat  Neurologic: No dizziness, blackouts, seizures. No recent symptoms of stroke or mini- stroke. No recent episodes of slurred speech, or temporary blindness.  Cardiac: No recent episodes of chest pain/pressure, no shortness of breath at rest.  No shortness of breath with exertion.  Denies history of atrial fibrillation or irregular heartbeat  Vascular: No history of rest pain in feet.  No history of claudication.  No history of non-healing ulcer, No history of DVT   Pulmonary: No home oxygen, no productive cough,  no hemoptysis,  No asthma or wheezing  Musculoskeletal:  [ ]  Arthritis, [ ]  Low back pain,  [ ]  Joint pain  Hematologic:No history of hypercoagulable state.  No history of easy bleeding.  No history of anemia  Gastrointestinal: No hematochezia or melena,  No gastroesophageal reflux, no trouble swallowing  Urinary: [ ]  chronic Kidney disease, [ ]  on HD - [ ]  MWF or [ ]  TTHS, [ ]  Burning with urination, [ ]  Frequent urination, [ ]  Difficulty urinating;   Skin: No rashes  Psychological: No history of anxiety,  No history of depression   Physical Examination  Vitals:   02/04/19 1349  BP: 122/70  Pulse: 69  Resp: 14  SpO2: 98%  Weight: 97 lb (44 kg)  Height: 5' 4.5" (1.638 m)    Body mass index is 16.39 kg/m.  General:  Alert and oriented, no acute distress HEENT: Normal Cardiac: Regular Rate and Rhythm  Skin: No rash, multiple spider and reticular type varicosities especially concentrated around the ankles and foot in both legs Extremity Pulses:  2+ dorsalis pedis pulses bilaterally Musculoskeletal: No deformity or edema  Neurologic: Upper and lower extremity motor 5/5 and symmetric          DATA:  Patient had a venous reflux exam today.  I reviewed and interpreted the study.  This showed absent right greater saphenous vein from prior stripping.  The patient did have reflux diffusely throughout her right lesser saphenous vein with a vein diameter of 5 to 6 mm.  There was no evidence of DVT.  I repeated portions of her ultrasound with the SonoSite at the bedside which confirmed the above findings.  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  ASSESSMENT: Symptomatic varicose veins right leg with 1 prior bleeding episode.  She also has aches and pains in this leg.   PLAN: Patient was given a prescription today for bilateral lower extremity  compression stockings.  She will try these for the next 3 months.  If she has an additional bleeding episode she will call us.  Otherwise she will reevaluate in about 3 months to see whether or not she would be a candidate for laser ablation of her lesser saphenous vein or she improves with conservative measures alone.  Ruta Hinds, MD [facility] [place] Office: [phone]  Pager: [phone]

## 2019-02-05 ENCOUNTER — Encounter: Payer: Self-pay | Admitting: Vascular Surgery

## 2019-02-12 NOTE — Progress Notes (Signed)
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  Chronic obstructive pulmonary disease, unspecified COPD type ([place])  will get CXR, continue to avoid triggers, call if any change in symptoms, continue exercise- albuterol sent in  Osteoporosis, unspecified osteoporosis type, unspecified pathological fracture presence Willing to check DEXA, continue vitamin D  Anemia, unspecified type -     CBC with Differential/Platelet  History of breast cancer S/p mastectomy no need for MGM  Mild malnutrition ([place]) Add ensure -     Hepatic function panel  Mixed hyperlipidemia -continue medications, check lipids, decrease fatty foods, increase activity.  -     Lipid panel  Vitamin D deficiency -     continue supplement  Primary osteoarthritis involving multiple joints Continue walking  Elevated BP without diagnosis of hypertension - continue medications, DASH diet, exercise and monitor at home. Call if greater than 130/80.   Medication management  CPE Will think about cologuard or colonscopy  Over 40 minutes of exam, counseling, chart review and critical decision making was performed Future Appointments  Date Time Provider [place]  05/27/2019  1:20 PM Elam Dutch, MD [facility]  09/02/2019  2:00 PM Vicie Mutters, PA-C [facility] None  02/16/2020  2:00 PM Vicie Mutters, PA-C [facility] None    Subjective:  Helen Davis is a 78 y.o. female who presents CPE and 3 month follow up.   She is following with Dr. Oneida Alar for varicose veins, had ulcer on right lower leg that bleed recently, following with him. Going to get on compression socks.   Her blood pressure has been controlled at home, today their BP is BP: 118/66 She does workout, walks with chipy her dog, 2-3 mile walk. She denies chest pain, shortness of breath, dizziness.  She has COPD, could not afford Symbicort, has albuterol and states with cooler weather it is better.  She is  not on cholesterol medication and denies myalgias. Her cholesterol is at goal. The cholesterol last visit was:   Lab Results  Component Value Date   CHOL 220 (H) 02/12/2018   HDL 109 02/12/2018   LDLCALC 93 02/12/2018   TRIG 89 02/12/2018   CHOLHDL 2.0 02/12/2018    Last A1C in the office was:  Lab Results  Component Value Date   HGBA1C 4.7 06/11/2016   Patient is on Vitamin D supplement.   Lab Results  Component Value Date   VD25OH 24 02/12/2018     She is retired.   She is not going to counseling anymore. S/p double mastectomy, does self breast exams.  She has osteoporosis and has history of lumbar compression fracture.  BMI is Body mass index is 16.86 kg/m., she is working on increasing her weight.  Wt Readings from Last 3 Encounters:  02/16/19 98 lb 3.2 oz (44.5 kg)  02/04/19 97 lb (44 kg)  01/01/19 97 lb 6.4 oz (44.2 kg)     Medication Review: Current Outpatient Medications on File Prior to Visit  Medication Sig Dispense Refill  . albuterol (VENTOLIN HFA) 108 (90 Base) MCG/ACT inhaler Inhale 2 puffs into the lungs every 4 (four) hours as needed for wheezing or shortness of breath. 1 Inhaler 0  . Ascorbic Acid (VITAMIN C) 1000 MG tablet Take 500 mg by mouth daily.     . Cholecalciferol (VITAMIN D PO) Take 2,000 Int'l Units by mouth daily.    . Cyanocobalamin (VITAMIN B 12 PO) Take 500 mcg by mouth daily.     Marland Kitchen  MAGNESIUM PO Take 500 mg by mouth daily.     . Zinc 30 MG TABS Take by mouth daily.     No current facility-administered medications on file prior to visit.     Current Problems (verified) Patient Active Problem List   Diagnosis Date Noted  . Varicose veins with ulcer, right ([place]) 02/16/2019  . COPD (chronic obstructive pulmonary disease) ([place]) 12/09/2015  . Mild malnutrition ([place]) 12/08/2015  . DJD (degenerative joint disease) 07/28/2015  . Mixed hyperlipidemia 11/15/2014  . Osteoporosis 11/15/2014  . Anemia   . History of breast cancer   . Vitamin D  deficiency   . Mitral valve disorder 10/11/2009   Screening Tests Preventative care:  Immunization History  Administered Date(s) Administered  . Pneumococcal Conjugate-13 02/12/2018  . Pneumococcal Polysaccharide-23 02/16/2019  . Tdap 09/18/2009   Last colonoscopy: 2013  Last mammogram: 1999 s/p bilateral mastectomy Last pap smear/pelvic exam: 2013 declines another DEXA: 2013 Osteoporosis, was on fosamax in the past (2002), declines repeat eval CXR 11/2015 Echo 2011  Prior vaccinations: TD or Tdap: 2011  Influenza: declines  Pneumococcal: 2020 Prevnar13:2019 Shingles/Zostavax: declines  Names of Other Physician/Practitioners you currently use: 1. [facility] here for primary care 2. Dr. Talbert Forest, eye doctor  3. Dr. Bobby Rumpf, dentist Patient Care Team: Unk Pinto, MD as PCP - General (Internal Medicine) Darleen Crocker, MD as Consulting Physician (Ophthalmology) Richmond Campbell, MD as Consulting Physician (Gastroenterology) Druscilla Brownie, MD as Consulting Physician (Dermatology) Garald Balding, MD as Consulting Physician (Orthopedic Surgery) Unice Bailey, MD as Consulting Physician (Rheumatology) Victorino Dike, DDS as Consulting Physician (Dentistry) Larey Dresser, MD as Consulting Physician (Cardiology)  Allergies No Known Allergies  SURGICAL HISTORY She  has a past surgical history that includes Mastectomy; Appendectomy (1966); and Varicose vein surgery (Right, 1975). FAMILY HISTORY Her family history includes Atrial fibrillation in her mother; Cancer in an other family member; Dementia in her father; Emphysema in her father; Hypertension in an other family member; Stroke in her mother and another family member. SOCIAL HISTORY She  reports that she quit smoking about 46 years ago. She has never used smokeless tobacco. She reports current alcohol use of about 3.0 standard drinks of alcohol per week. She reports that  she does not use drugs.   Review of Systems  Constitutional: Negative.   HENT: Negative.   Eyes: Negative.   Respiratory: Negative.   Cardiovascular: Negative.   Gastrointestinal: Negative.   Genitourinary: Negative.   Musculoskeletal: Negative.   Skin: Negative.   Neurological: Negative.   Psychiatric/Behavioral: Negative.    Objective:     Blood pressure 118/66, pulse 97, temperature 97.7 F (36.5 C), height 5\' 4"  (1.626 m), weight 98 lb 3.2 oz (44.5 kg), SpO2 98 %. Body mass index is 16.86 kg/m.  General appearance: alert, no distress, thin appearing, WD/WN, female HEENT: normocephalic, sclerae anicteric, TMs pearly, nares patent, no discharge or erythema, pharynx normal Oral cavity: MMM, no lesions Neck: supple, no lymphadenopathy, no thyromegaly, no masses Heart: RRR, normal S1, S2, with holosystolic murmur Lungs: CTA bilaterally, no wheezes, rhonchi, or rales Abdomen: +bs, soft, non tender, non distended, no masses, no hepatomegaly, no splenomegaly Chest: s/p bilateral mastectomy without any lumps, barrel chest.  Musculoskeletal: nontender, no swelling, no obvious deformity Extremities: no edema, no cyanosis, no clubbing Pulses: 2+ symmetric, upper and lower extremities, normal cap refill Neurological: alert, oriented x 3, CN2-12 intact, strength normal upper extremities and lower extremities, sensation normal throughout, DTRs 2+ throughout,  no cerebellar signs, gait normal Psychiatric: normal affect, behavior normal, pleasant      Vicie Mutters, PA-C   02/16/2019

## 2019-02-16 ENCOUNTER — Ambulatory Visit (INDEPENDENT_AMBULATORY_CARE_PROVIDER_SITE_OTHER): Payer: Medicare Other | Admitting: Physician Assistant

## 2019-02-16 ENCOUNTER — Encounter: Payer: Self-pay | Admitting: Physician Assistant

## 2019-02-16 ENCOUNTER — Other Ambulatory Visit: Payer: Self-pay

## 2019-02-16 VITALS — BP 118/66 | HR 97 | Temp 97.7°F | Ht 64.0 in | Wt 98.2 lb

## 2019-02-16 DIAGNOSIS — E441 Mild protein-calorie malnutrition: Secondary | ICD-10-CM

## 2019-02-16 DIAGNOSIS — M159 Polyosteoarthritis, unspecified: Secondary | ICD-10-CM

## 2019-02-16 DIAGNOSIS — M810 Age-related osteoporosis without current pathological fracture: Secondary | ICD-10-CM

## 2019-02-16 DIAGNOSIS — Z1389 Encounter for screening for other disorder: Secondary | ICD-10-CM

## 2019-02-16 DIAGNOSIS — J449 Chronic obstructive pulmonary disease, unspecified: Secondary | ICD-10-CM

## 2019-02-16 DIAGNOSIS — E782 Mixed hyperlipidemia: Secondary | ICD-10-CM

## 2019-02-16 DIAGNOSIS — Z0001 Encounter for general adult medical examination with abnormal findings: Secondary | ICD-10-CM

## 2019-02-16 DIAGNOSIS — I83019 Varicose veins of right lower extremity with ulcer of unspecified site: Secondary | ICD-10-CM

## 2019-02-16 DIAGNOSIS — I10 Essential (primary) hypertension: Secondary | ICD-10-CM | POA: Diagnosis not present

## 2019-02-16 DIAGNOSIS — I059 Rheumatic mitral valve disease, unspecified: Secondary | ICD-10-CM

## 2019-02-16 DIAGNOSIS — Z0000 Encounter for general adult medical examination without abnormal findings: Secondary | ICD-10-CM | POA: Diagnosis not present

## 2019-02-16 DIAGNOSIS — Z136 Encounter for screening for cardiovascular disorders: Secondary | ICD-10-CM

## 2019-02-16 DIAGNOSIS — Z23 Encounter for immunization: Secondary | ICD-10-CM | POA: Diagnosis not present

## 2019-02-16 DIAGNOSIS — M8949 Other hypertrophic osteoarthropathy, multiple sites: Secondary | ICD-10-CM

## 2019-02-16 DIAGNOSIS — L97919 Non-pressure chronic ulcer of unspecified part of right lower leg with unspecified severity: Secondary | ICD-10-CM | POA: Insufficient documentation

## 2019-02-16 DIAGNOSIS — E559 Vitamin D deficiency, unspecified: Secondary | ICD-10-CM | POA: Diagnosis not present

## 2019-02-16 DIAGNOSIS — Z79899 Other long term (current) drug therapy: Secondary | ICD-10-CM

## 2019-02-16 DIAGNOSIS — D649 Anemia, unspecified: Secondary | ICD-10-CM

## 2019-02-16 DIAGNOSIS — Z853 Personal history of malignant neoplasm of breast: Secondary | ICD-10-CM

## 2019-02-16 NOTE — Patient Instructions (Addendum)
Mitral Valve Regurgitation  Mitral valve regurgitation, also called mitral regurgitation, is a condition in which some blood leaks back (regurgitates) through the mitral valve in the heart. The mitral valve is located between the upper left chamber (left atrium) and the lower left chamber (left ventricle) of the heart. When blood travels through the heart, it goes from the left atrium to the left ventricle and then out to the body. Normally, the mitral valve opens when the atrium pumps blood into the ventricle, and it closes when the ventricle pumps blood out to the body. Mitral valve regurgitation happens when the mitral valve does not close properly. As a result, blood in the ventricle leaks back into the atrium. Mitral valve regurgitation causes the heart to work harder to pump blood. If the condition is mild, a person may not have symptoms. However, over time, this can lead to heart failure. What are the causes? This condition may be caused by:  A condition in which the mitral valve does not close completely when the heart pumps blood (mitral valve prolapse).  Heart valve infection (endocarditis).  Certain types of heart disease.  A condition that causes inflammation of the heart, blood vessels, or joints (rheumatic fever).  Certain conditions that are present at birth (congenital heart defect).  Previous radiation therapy to the chest area.  Damage to the mitral valve, such as from injury (trauma) to the heart or a heart attack.  Certain combinations of weight-loss (anti-obesity) medicines. What are the signs or symptoms? In some cases of mild to moderate mitral regurgitation, there are no symptoms. Symptoms of this condition include:  Shortness of breath.  Fatigue.  Activity intolerance.  Cough. Severe symptoms of this condition include:  Suddenly waking up at night with difficulty breathing.  Fast or irregular heartbeat.  Swelling in the lower legs, ankles, and  feet.  Fluid in the lungs that makes it very hard to breathe (pulmonary edema). How is this diagnosed? This condition may be diagnosed based on:  A physical exam. Your health care provider will listen to your heart for an abnormal heart sound (murmur).  Tests to confirm the diagnosis. These may include: ? A test that creates ultrasound images of the heart (echocardiogram). This test allows your health care provider to see how the heart valves work while your heart is beating. ? Chest X-ray. ? A test that records the electrical impulses of the heart (electrocardiogram, or ECG). ? A test that looks at the structure and function of the heart (cardiac catheterization). How is this treated? This condition may be treated with:  Medicines. These may be given to treat symptoms and prevent complications.  Surgery or catheter-based procedures to repair or replace the mitral valve. This may be done in severe, long-term (chronic) cases. Follow these instructions at home: Eating and drinking   Eat a heart-healthy diet that includes whole grains, fresh fruits and vegetables, low-fat (lean) proteins, and low-fat or nonfat dairy products. Consider working with a dietitian to help you make healthy food choices.  Limit how much salt (sodium) you eat as told by your health care provider. Follow instructions from your health care provider about any other eating or drinking restrictions, such as limiting foods that are high in fat and processed sugars.  Use healthy cooking methods, such as roasting, grilling, broiling, baking, poaching, steaming, or stir-frying. Alcohol use  Do not drink alcohol if: ? Your health care provider tells you not to drink. ? You are pregnant, may be pregnant,  or are planning to become pregnant.  If you drink alcohol: ? Limit how much you use to:  0-1 drink a day for women.  0-2 drinks a day for men. ? Be aware of how much alcohol is in your drink. In the [place] one drink  equals one 12 oz bottle of beer (355 mL), one 5 oz glass of wine (148 mL), or one 1 oz glass of hard liquor (44 mL). Activity  Return to your normal activities as told by your health care provider. Ask your health care provider what activities are safe for you.  Regular exercise is important for the health of your heart and for maintaining a healthy weight. Ask your health care provider what type of exercise is safe for you. You may need to avoid strenuous exercise. Lifestyle  Maintain a healthy weight.  Do not use any products that contain nicotine or tobacco, such as cigarettes, e-cigarettes, and chewing tobacco. If you need help quitting, ask your health care provider.  Find ways to manage stress. If you need help with this, ask your health care provider. General instructions  Take over-the-counter and prescription medicines only as told by your health care provider.  Work closely with your health care provider to manage any other health conditions you have, such as diabetes or high blood pressure.  If you plan to become pregnant, talk with your health care provider first.  Keep all follow-up visits as told by your health care provider. This is important. Contact a health care provider if you:  Have a fever.  Feel more tired than usual when doing physical activity.  Have a dry cough. Get help right away if you:  Have shortness of breath.  Develop chest pain.  Have swelling in your hands, feet, ankles, or abdomen that is getting worse.  Have trouble staying awake or you faint.  Feel dizzy or unsteady.  Suddenly gain weight.  Feel confused. These symptoms may represent a serious problem that is an emergency. Do not wait to see if the symptoms will go away. Get medical help right away. Call your local emergency services (911 in the [place] Do not drive yourself to the hospital. Summary  Mitral valve regurgitation, also called mitral regurgitation, is a condition in  which some blood leaks back (regurgitates) through the mitral valve in the heart.  Depending on how severe your condition is, you may be treated with medicines, catheter-based procedures, or surgery.  Practice heart-healthy habits to manage this condition. These include limiting alcohol, avoiding nicotine and tobacco, and eating a heart-healthy diet that is low in salt (sodium). This information is not intended to replace advice given to you by your health care provider. Make sure you discuss any questions you have with your health care provider. Document Released: 07/25/2004 Document Revised: 04/20/2018 Document Reviewed: 04/20/2018 Elsevier Patient Education  2020 [employer].   Osteoporosis  Osteoporosis is thinning and loss of density in your bones. Osteoporosis makes bones more brittle and fragile and more likely to break (fracture). Over time, osteoporosis can cause your bones to become so weak that they fracture after a minor fall. Bones in the hip, wrist, and spine are most likely to fracture due to osteoporosis. What are the causes? The exact cause of this condition is not known. What increases the risk? You may be at greater risk for osteoporosis if you:  Have a family history of the condition.  Have poor nutrition.  Use steroid medicines, such as prednisone.  Are  female.  Are age 15 or older.  Smoke or have a history of smoking.  Are not physically active (are sedentary).  Are white (Caucasian) or of Asian descent.  Have a small body frame.  Take certain medicines, such as antiseizure medicines. What are the signs or symptoms? A fracture might be the first sign of osteoporosis, especially if the fracture results from a fall or injury that usually would not cause a bone to break. Other signs and symptoms include:  Pain in the neck or low back.  Stooped posture.  Loss of height. How is this diagnosed? This condition may be diagnosed based on:  Your medical  history.  A physical exam.  A bone mineral density test, also called a DXA or DEXA test (dual-energy X-ray absorptiometry test). This test uses X-rays to measure the amount of minerals in your bones. How is this treated? The goal of treatment is to strengthen your bones and lower your risk for a fracture. Treatment may involve:  Making lifestyle changes, such as: ? Including foods with more calcium and vitamin D in your diet. ? Doing weight-bearing and muscle-strengthening exercises. ? Stopping tobacco use. ? Limiting alcohol intake.  Taking medicine to slow the process of bone loss or to increase bone density.  Taking daily supplements of calcium and vitamin D.  Taking hormone replacement medicines, such as estrogen for women and testosterone for men.  Monitoring your levels of calcium and vitamin D. Follow these instructions at home:  Activity  Exercise as told by your health care provider. Ask your health care provider what exercises and activities are safe for you. You should do: ? Exercises that make you work against gravity (weight-bearing exercises), such as tai chi, yoga, or walking. ? Exercises to strengthen muscles, such as lifting weights. Lifestyle  Limit alcohol intake to no more than 1 drink a day for nonpregnant women and 2 drinks a day for men. One drink equals 12 oz of beer, 5 oz of wine, or 1 oz of hard liquor.  Do not use any products that contain nicotine or tobacco, such as cigarettes and e-cigarettes. If you need help quitting, ask your health care provider. Preventing falls  Use devices to help you move around (mobility aids) as needed, such as canes, walkers, scooters, or crutches.  Keep rooms well-lit and clutter-free.  Remove tripping hazards from walkways, including cords and throw rugs.  Install grab bars in bathrooms and safety rails on stairs.  Use rubber mats in the bathroom and other areas that are often wet or slippery.  Wear closed-toe  shoes that fit well and support your feet. Wear shoes that have rubber soles or low heels.  Review your medicines with your health care provider. Some medicines can cause dizziness or changes in blood pressure, which can increase your risk of falling. General instructions  Include calcium and vitamin D in your diet. Calcium is important for bone health, and vitamin D helps your body to absorb calcium. Good sources of calcium and vitamin D include: ? Certain fatty fish, such as salmon and tuna. ? Products that have calcium and vitamin D added to them (fortified products), such as fortified cereals. ? Egg yolks. ? Cheese. ? Liver.  Take over-the-counter and prescription medicines only as told by your health care provider.  Keep all follow-up visits as told by your health care provider. This is important. Contact a health care provider if:  You have never been screened for osteoporosis and you are: ?  A woman who is age 80 or older. ? A man who is age 10 or older. Get help right away if:  You fall or injure yourself. Summary  Osteoporosis is thinning and loss of density in your bones. This makes bones more brittle and fragile and more likely to break (fracture),even with minor falls.  The goal of treatment is to strengthen your bones and reduce your risk for a fracture.  Include calcium and vitamin D in your diet. Calcium is important for bone health, and vitamin D helps your body to absorb calcium.  Talk with your health care provider about screening for osteoporosis if you are a woman who is age 41 or older, or a man who is age 65 or older. This information is not intended to replace advice given to you by your health care provider. Make sure you discuss any questions you have with your health care provider. Document Released: 02/14/2005 Document Revised: 04/19/2017 Document Reviewed: 03/01/2017 Elsevier Patient Education  2020 [employer].

## 2019-02-18 LAB — COMPLETE METABOLIC PANEL WITH GFR
AG Ratio: 1.9 (calc) (ref 1.0–2.5)
ALT: 16 U/L (ref 6–29)
AST: 22 U/L (ref 10–35)
Albumin: 4.3 g/dL (ref 3.6–5.1)
Alkaline phosphatase (APISO): 63 U/L (ref 37–153)
BUN: 7 mg/dL (ref 7–25)
CO2: 27 mmol/L (ref 20–32)
Calcium: 9.5 mg/dL (ref 8.6–10.4)
Chloride: 97 mmol/L — ABNORMAL LOW (ref 98–110)
Creat: 0.69 mg/dL (ref 0.60–0.93)
GFR, Est African American: 98 mL/min/{1.73_m2} (ref >=60)
GFR, Est Non African American: 85 mL/min/{1.73_m2} (ref >=60)
Globulin: 2.3 g/dL (calc) (ref 1.9–3.7)
Glucose, Bld: 87 mg/dL (ref 65–99)
Potassium: 4.6 mmol/L (ref 3.5–5.3)
Sodium: 133 mmol/L — ABNORMAL LOW (ref 135–146)
Total Bilirubin: 0.8 mg/dL (ref 0.2–1.2)
Total Protein: 6.6 g/dL (ref 6.1–8.1)

## 2019-02-18 LAB — LIPID PANEL
Cholesterol: 209 mg/dL — ABNORMAL HIGH (ref <200)
HDL: 103 mg/dL (ref >=50)
LDL Cholesterol (Calc): 84 mg/dL (calc)
Non-HDL Cholesterol (Calc): 106 mg/dL (calc) (ref <130)
Total CHOL/HDL Ratio: 2 (calc) (ref <5.0)
Triglycerides: 127 mg/dL (ref <150)

## 2019-02-18 LAB — CBC WITH DIFFERENTIAL/PLATELET
Absolute Monocytes: 620 cells/uL (ref 200–950)
Basophils Absolute: 100 cells/uL (ref 0–200)
Basophils Relative: 2 %
Eosinophils Absolute: 70 cells/uL (ref 15–500)
Eosinophils Relative: 1.4 %
HCT: 34.9 % — ABNORMAL LOW (ref 35.0–45.0)
Hemoglobin: 11.5 g/dL — ABNORMAL LOW (ref 11.7–15.5)
Lymphs Abs: 1245 cells/uL (ref 850–3900)
MCH: 31 pg (ref 27.0–33.0)
MCHC: 33 g/dL (ref 32.0–36.0)
MCV: 94.1 fL (ref 80.0–100.0)
MPV: 11.7 fL (ref 7.5–12.5)
Monocytes Relative: 12.4 %
Neutro Abs: 2965 cells/uL (ref 1500–7800)
Neutrophils Relative %: 59.3 %
Platelets: 189 10*3/uL (ref 140–400)
RBC: 3.71 10*6/uL — ABNORMAL LOW (ref 3.80–5.10)
RDW: 11.6 % (ref 11.0–15.0)
Total Lymphocyte: 24.9 %
WBC: 5 10*3/uL (ref 3.8–10.8)

## 2019-02-18 LAB — MAGNESIUM: Magnesium: 1.9 mg/dL (ref 1.5–2.5)

## 2019-02-18 LAB — MICROALBUMIN / CREATININE URINE RATIO
Creatinine, Urine: 24 mg/dL (ref 20–275)
Microalb Creat Ratio: 8 mcg/mg creat (ref <30)
Microalb, Ur: 0.2 mg/dL

## 2019-02-18 LAB — URINALYSIS, ROUTINE W REFLEX MICROSCOPIC
Bilirubin Urine: NEGATIVE
Glucose, UA: NEGATIVE
Hgb urine dipstick: NEGATIVE
Ketones, ur: NEGATIVE
Leukocytes,Ua: NEGATIVE
Nitrite: NEGATIVE
Protein, ur: NEGATIVE
Specific Gravity, Urine: 1.008 (ref 1.001–1.03)
pH: 7 (ref 5.0–8.0)

## 2019-02-18 LAB — TEST AUTHORIZATION

## 2019-02-18 LAB — IRON,TIBC AND FERRITIN PANEL
%SAT: 33 % (calc) (ref 16–45)
Ferritin: 19 ng/mL (ref 16–288)
Iron: 126 ug/dL (ref 45–160)
TIBC: 383 mcg/dL (calc) (ref 250–450)

## 2019-02-18 LAB — VITAMIN D 25 HYDROXY (VIT D DEFICIENCY, FRACTURES): Vit D, 25-Hydroxy: 55 ng/mL (ref 30–100)

## 2019-02-18 LAB — TSH: TSH: 0.71 mIU/L (ref 0.40–4.50)

## 2019-05-13 ENCOUNTER — Ambulatory Visit: Payer: Medicare Other | Admitting: Orthopaedic Surgery

## 2019-05-13 ENCOUNTER — Encounter: Payer: Self-pay | Admitting: Orthopaedic Surgery

## 2019-05-13 ENCOUNTER — Other Ambulatory Visit: Payer: Self-pay

## 2019-05-13 DIAGNOSIS — M1711 Unilateral primary osteoarthritis, right knee: Secondary | ICD-10-CM

## 2019-05-13 MED ORDER — METHYLPREDNISOLONE ACETATE 40 MG/ML IJ SUSP
80.0000 mg | INTRAMUSCULAR | Status: AC | PRN
  Administered 2019-05-13: 80 mg via INTRA_ARTICULAR

## 2019-05-13 MED ORDER — LIDOCAINE HCL 1 % IJ SOLN
2.0000 mL | INTRAMUSCULAR | Status: AC | PRN
  Administered 2019-05-13: 2 mL

## 2019-05-13 MED ORDER — BUPIVACAINE HCL 0.5 % IJ SOLN
2.0000 mL | INTRAMUSCULAR | Status: AC | PRN
  Administered 2019-05-13: 2 mL via INTRA_ARTICULAR

## 2019-05-13 NOTE — Progress Notes (Signed)
Office Visit Note   Patient: Helen Davis           Date of Birth: 04-28-1941           MRN: [id number] Visit Date: 05/13/2019              Requested by: Unk Pinto, [place] [address] [place],  [place] [address] PCP: Unk Pinto, MD   Assessment & Plan: Visit Diagnoses:  1. Unilateral primary osteoarthritis, right knee     Plan: Predominantly lateral compartment arthritis right knee but with tricompartmental changes.  Good relief with cortisone injection in January 2020.  Will reinject with cortisone today  Follow-Up Instructions: Return if symptoms worsen or fail to improve.   Orders:  Orders Placed This Encounter  Procedures  . Large Joint Inj: R knee   No orders of the defined types were placed in this encounter.     Procedures: Large Joint Inj: R knee on 05/13/2019 2:28 PM Indications: pain and diagnostic evaluation Details: 25 G 1.5 in needle, anteromedial approach  Arthrogram: No  Medications: 2 mL lidocaine 1 %; 2 mL bupivacaine 0.5 %; 80 mg methylPREDNISolone acetate 40 MG/ML Procedure, treatment alternatives, risks and benefits explained, specific risks discussed. Consent was given by the patient. Immediately prior to procedure a time out was called to verify the correct patient, procedure, equipment, support staff and site/side marked as required. Patient was prepped and draped in the usual sterile fashion.       Clinical Data: No additional findings.   Subjective: Chief Complaint  Patient presents with  . Right Knee - Pain  Patient presents today for right knee pain. She was last evaluated in January and received a cortisone injection. She said that it started hurting again about 10days ago. She wants another cortisone injection. The pain is waking her at night. She takes Advil as needed.   HPI  Review of Systems   Objective: Vital Signs: Ht 5' 4.5" (1.638 m)   Wt 94 lb (42.6 kg)   LMP  (LMP Unknown)   BMI 15.89  kg/m   Physical Exam Constitutional:      Appearance: She is well-developed.  Eyes:     Pupils: Pupils are equal, round, and reactive to light.  Pulmonary:     Effort: Pulmonary effort is normal.  Skin:    General: Skin is warm and dry.  Neurological:     Mental Status: She is alert and oriented to person, place, and time.  Psychiatric:        Behavior: Behavior normal.     Ortho Exam awake alert and oriented x3.  Comfortable sitting.  Right knee with increased valgus with weightbearing of approximately 5 to 6 degrees.  No calf pain.  Some patellar crepitation.  Motor exam intact.  No instability.  Mild lateral joint pain  Specialty Comments:  No specialty comments available.  Imaging: No results found.   PMFS History: Patient Active Problem List   Diagnosis Date Noted  . Unilateral primary osteoarthritis, right knee 05/13/2019  . Varicose veins with ulcer, right ([place]) 02/16/2019  . COPD (chronic obstructive pulmonary disease) ([place]) 12/09/2015  . Mild malnutrition ([place]) 12/08/2015  . DJD (degenerative joint disease) 07/28/2015  . Mixed hyperlipidemia 11/15/2014  . Osteoporosis 11/15/2014  . Anemia   . History of breast cancer   . Vitamin D deficiency   . 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 ABNORMAL EKG   .  Anemia   . Breast cancer ([place])   . Fatigue   . Mitral valve disorders(424.0)   . Vitamin D deficiency     Family History  Problem Relation Age of Onset  . Hypertension Other   . Stroke Other   . Cancer Other   . Atrial fibrillation Mother   . Stroke Mother   . Emphysema Father   . Dementia Father   . Heart disease Sister        CHF    Past Surgical History:  Procedure Laterality Date  . APPENDECTOMY  1966  . MASTECTOMY    . VARICOSE VEIN SURGERY Right 1975   Social History   Occupational History  . Not on file  Tobacco Use  . Smoking status: Former Smoker    Quit date: 05/21/1972    Years since  quitting: 47.0  . Smokeless tobacco: Never Used  Substance and Sexual Activity  . Alcohol use: Yes    Alcohol/week: 3.0 standard drinks    Types: 3 Glasses of wine per week  . Drug use: No  . Sexual activity: Not on file

## 2019-05-27 ENCOUNTER — Ambulatory Visit: Payer: Medicare Other | Admitting: Vascular Surgery

## 2019-06-17 ENCOUNTER — Ambulatory Visit: Payer: Medicare Other | Admitting: Vascular Surgery

## 2019-06-17 ENCOUNTER — Ambulatory Visit: Payer: Self-pay

## 2019-06-26 ENCOUNTER — Ambulatory Visit: Payer: Medicare Other | Admitting: Vascular Surgery

## 2019-06-26 ENCOUNTER — Ambulatory Visit: Payer: Medicare Other

## 2019-08-26 ENCOUNTER — Ambulatory Visit: Payer: Medicare Other | Admitting: Vascular Surgery

## 2019-09-02 ENCOUNTER — Ambulatory Visit: Payer: Medicare Other | Admitting: Physician Assistant

## 2019-09-02 DIAGNOSIS — H04123 Dry eye syndrome of bilateral lacrimal glands: Secondary | ICD-10-CM | POA: Diagnosis not present

## 2019-09-02 DIAGNOSIS — H33321 Round hole, right eye: Secondary | ICD-10-CM | POA: Diagnosis not present

## 2019-09-02 DIAGNOSIS — H26493 Other secondary cataract, bilateral: Secondary | ICD-10-CM | POA: Diagnosis not present

## 2019-09-02 DIAGNOSIS — H5203 Hypermetropia, bilateral: Secondary | ICD-10-CM | POA: Diagnosis not present

## 2019-09-02 DIAGNOSIS — H5211 Myopia, right eye: Secondary | ICD-10-CM | POA: Diagnosis not present

## 2019-09-07 NOTE — Progress Notes (Signed)
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   Chronic obstructive pulmonary disease, unspecified COPD type ([place])  will get CXR- last one 2019, continue to avoid triggers, call if any change in symptoms, continue exercise- albuterol sent in  Osteoporosis, unspecified osteoporosis type, unspecified pathological fracture presence Still need DEXA- last one 2011, DECLINES-continue vitamin D  Anemia, unspecified type -     CBC with Differential/Platelet  History of breast cancer S/p mastectomy no need for MGM  Mild malnutrition ([place]) Add ensure -     Hepatic function panel  Mixed hyperlipidemia -continue medications, check lipids, decrease fatty foods, increase activity.  -     Lipid panel  Vitamin D deficiency -     continue supplement  Primary osteoarthritis involving multiple joints Continue walking  Elevated BP without diagnosis of hypertension - continue medications, DASH diet, exercise and monitor at home. Call if greater than 130/80.   Medication management  MEDICARE WELLNESS Declines cologuard or colonscopy  Over 40 minutes of exam, counseling, chart review and critical decision making was performed Future Appointments  Date Time Provider [place]  10/14/2019  1:00 PM Elam Dutch, MD [facility]  02/16/2020  2:00 PM Vicie Mutters, PA-C [facility] None   MEDICARE WELLNESS OBJECTIVES: Physical activity: Current Exercise Habits: Home exercise routine, Type of exercise: walking, Time (Minutes): 20, Frequency (Times/Week): 3, Weekly Exercise (Minutes/Week): 60, Intensity: Mild  Yes, walking Cardiac risk factors: Cardiac Risk Factors include: advanced age (>32men, >74 women);dyslipidemia;hypertension Depression/mood screen:   Depression screen [facility] 2/9 09/09/2019  Decreased Interest 0  Down, Depressed, Hopeless 0  PHQ - 2 Score 0    ADLs:  In  your present state of health, do you have any difficulty performing the following activities: 09/09/2019  Hearing? N  Vision? N  Difficulty concentrating or making decisions? N  Walking or climbing stairs? N  Dressing or bathing? N  Doing errands, shopping? N  Some recent data might be hidden     Cognitive Testing  Alert? Yes  Normal Appearance?Yes  Oriented to person? Yes  Place? Yes   Time? Yes  Recall of three objects?  Yes  Can perform simple calculations? Yes  Displays appropriate judgment?Yes  Can read the correct time from a watch face?Yes  EOL planning: Does Patient Have a Medical Advance Directive?: Yes Type of Advance Directive: Living will, Healthcare Power of Attorney Does patient want to make changes to medical advance directive?: No - Patient declined Copy of [place] in Chart?: No - copy requested Yes, need copy   Medicare Attestation I have personally reviewed: The patient's medical and social history Their use of alcohol, tobacco or illicit drugs Their current medications and supplements The patient's functional ability including ADLs,fall risks, home safety risks, cognitive, and hearing and visual impairment Diet and physical activities Evidence for depression or mood disorders  The patient's weight, height, BMI, and visual acuity have been recorded in the chart.  I have made referrals, counseling, and provided education to the patient based on review of the above and I have provided the patient with a written personalized care plan for preventive services.     Subjective:  Helen Davis is a 79 y.o. female who presents wellness and 3 month follow up.   She is following with Dr. Oneida Alar for varicose veins, had ulcer on right lower leg that bleed recently, following with  him. Going to get on compression socks.   Her blood pressure has been controlled at home, today their BP is BP: 100/62 She does workout, walks with chipy her dog, 2-3  mile walk. She denies chest pain, shortness of breath, dizziness.  She has COPD, could not afford Symbicort, has albuterol and states with cooler weather it is better.  She is not on cholesterol medication and denies myalgias. Her cholesterol is at goal. The cholesterol last visit was:   Lab Results  Component Value Date   CHOL 209 (H) 02/16/2019   HDL 103 02/16/2019   LDLCALC 84 02/16/2019   TRIG 127 02/16/2019   CHOLHDL 2.0 02/16/2019    Last A1C in the office was:  Lab Results  Component Value Date   HGBA1C 4.7 06/11/2016   Patient is on Vitamin D supplement.   Lab Results  Component Value Date   VD25OH 59 02/16/2019     She is retired.   She is not going to counseling anymore. S/p double mastectomy, does self breast exams.  She has osteoporosis and has history of lumbar compression fracture.  BMI is Body mass index is 16.72 kg/m., she is working on increasing her weight.  Wt Readings from Last 3 Encounters:  09/09/19 97 lb 6.4 oz (44.2 kg)  05/13/19 94 lb (42.6 kg)  02/16/19 98 lb 3.2 oz (44.5 kg)     Medication Review: Current Outpatient Medications on File Prior to Visit  Medication Sig Dispense Refill  . albuterol (VENTOLIN HFA) 108 (90 Base) MCG/ACT inhaler Inhale 2 puffs into the lungs every 4 (four) hours as needed for wheezing or shortness of breath. 1 Inhaler 0  . Ascorbic Acid (VITAMIN C) 1000 MG tablet Take 500 mg by mouth daily.     . Cholecalciferol (VITAMIN D PO) Take 2,000 Int'l Units by mouth daily.    . Cyanocobalamin (VITAMIN B 12 PO) Take 500 mcg by mouth daily.     Marland Kitchen MAGNESIUM PO Take 500 mg by mouth daily.     . Multiple Vitamins-Minerals (CENTRUM SILVER PO) Take by mouth.    . Zinc 30 MG TABS Take by mouth daily.     No current facility-administered medications on file prior to visit.    Current Problems (verified) Patient Active Problem List   Diagnosis Date Noted  . Unilateral primary osteoarthritis, right knee 05/13/2019  . Varicose  veins with ulcer, right ([place]) 02/16/2019  . COPD (chronic obstructive pulmonary disease) ([place]) 12/09/2015  . Mild malnutrition ([place]) 12/08/2015  . DJD (degenerative joint disease) 07/28/2015  . Mixed hyperlipidemia 11/15/2014  . Osteoporosis 11/15/2014  . Anemia   . History of breast cancer   . Vitamin D deficiency   . Mitral valve disorder 10/11/2009   Screening Tests Preventative care:  Immunization History  Administered Date(s) Administered  . Pneumococcal Conjugate-13 02/12/2018  . Pneumococcal Polysaccharide-23 02/16/2019  . Tdap 09/18/2009   Last colonoscopy: 2013  Last mammogram: 1999 s/p bilateral mastectomy Last pap smear/pelvic exam: 2013 declines another DEXA: 2013 Osteoporosis, was on fosamax in the past (2002), declines repeat eval CXR 08/2017 Echo 2011  Prior vaccinations: TD or Tdap: 2011  Influenza: declines  Pneumococcal: 2020 Prevnar13:2019 Shingles/Zostavax: declines  Names of Other Physician/Practitioners you currently use: 1. [facility] here for primary care 2. Dr. Talbert Forest, eye doctor  3. Dr. Bobby Rumpf, dentist Patient Care Team: Unk Pinto, MD as PCP - General (Internal Medicine) Darleen Crocker, MD as Consulting Physician (Ophthalmology) Richmond Campbell, MD  as Consulting Physician (Gastroenterology) Druscilla Brownie, MD as Consulting Physician (Dermatology) Garald Balding, MD as Consulting Physician (Orthopedic Surgery) Unice Bailey, MD as Consulting Physician (Rheumatology) Victorino Dike, DDS as Consulting Physician (Dentistry) Larey Dresser, MD as Consulting Physician (Cardiology)  Allergies Allergies  Allergen Reactions  . Covid-19 Mrna Vacc (Moderna) Rash    Patient states 1 week after moderna had severe blistering rash from left shoulder down to left elbow, will not get second shot    SURGICAL HISTORY She  has a past surgical history that includes Mastectomy; Appendectomy (1966); and  Varicose vein surgery (Right, 1975). FAMILY HISTORY Her family history includes Atrial fibrillation in her mother; Cancer in an other family member; Dementia in her father; Emphysema in her father; Heart disease in her sister; Hypertension in an other family member; Stroke in her mother and another family member. SOCIAL HISTORY She  reports that she quit smoking about 47 years ago. She has never used smokeless tobacco. She reports current alcohol use of about 3.0 standard drinks of alcohol per week. She reports that she does not use drugs.   Review of Systems  Constitutional: Negative.   HENT: Negative.   Eyes: Negative.   Respiratory: Positive for cough and sputum production (clear, not increased). Negative for hemoptysis, shortness of breath and wheezing.   Cardiovascular: Positive for palpitations and leg swelling (has had for years, better in the AM, better with exercise). Negative for chest pain, orthopnea, claudication and PND.  Gastrointestinal: Negative.   Genitourinary: Negative.   Musculoskeletal: Negative.   Skin: Negative.   Neurological: Negative.  Negative for dizziness.  Psychiatric/Behavioral: Positive for depression (mild). Negative for hallucinations, memory loss, substance abuse and suicidal ideas. The patient is not nervous/anxious and does not have insomnia.    Objective:     Blood pressure 100/62, pulse 68, temperature 97.7 F (36.5 C), height 5\' 4"  (1.626 m), weight 97 lb 6.4 oz (44.2 kg), SpO2 98 %. Body mass index is 16.72 kg/m.  General appearance: alert, no distress, thin appearing, WD/WN, female HEENT: normocephalic, sclerae anicteric, TMs pearly, nares patent, no discharge or erythema, pharynx normal Oral cavity: MMM, no lesions Neck: supple, no lymphadenopathy, no thyromegaly, no masses Heart: RRR, normal S1, S2, with 4/6 holosystolic murmur Lungs: CTA bilaterally, no wheezes, rhonchi, or rales Abdomen: +bs, soft, non tender, non distended, no masses, no  hepatomegaly, no splenomegaly Chest: s/p bilateral mastectomy without any lumps, barrel chest.  Musculoskeletal: nontender, no swelling, no obvious deformity Extremities: no edema, no cyanosis, no clubbing Pulses: 2+ symmetric, upper and lower extremities, normal cap refill Neurological: alert, oriented x 3, CN2-12 intact, strength normal upper extremities and lower extremities, sensation normal throughout, DTRs 2+ throughout, no cerebellar signs, gait normal Psychiatric: normal affect, behavior normal, pleasant      Vicie Mutters, PA-C   09/09/2019

## 2019-09-09 ENCOUNTER — Encounter: Payer: Self-pay | Admitting: Physician Assistant

## 2019-09-09 ENCOUNTER — Other Ambulatory Visit: Payer: Self-pay

## 2019-09-09 ENCOUNTER — Ambulatory Visit (INDEPENDENT_AMBULATORY_CARE_PROVIDER_SITE_OTHER): Payer: Medicare Other | Admitting: Physician Assistant

## 2019-09-09 VITALS — BP 100/62 | HR 68 | Temp 97.7°F | Ht 64.0 in | Wt 97.4 lb

## 2019-09-09 DIAGNOSIS — E782 Mixed hyperlipidemia: Secondary | ICD-10-CM

## 2019-09-09 DIAGNOSIS — J449 Chronic obstructive pulmonary disease, unspecified: Secondary | ICD-10-CM | POA: Diagnosis not present

## 2019-09-09 DIAGNOSIS — I83019 Varicose veins of right lower extremity with ulcer of unspecified site: Secondary | ICD-10-CM | POA: Diagnosis not present

## 2019-09-09 DIAGNOSIS — M810 Age-related osteoporosis without current pathological fracture: Secondary | ICD-10-CM

## 2019-09-09 DIAGNOSIS — Z79899 Other long term (current) drug therapy: Secondary | ICD-10-CM | POA: Diagnosis not present

## 2019-09-09 DIAGNOSIS — E559 Vitamin D deficiency, unspecified: Secondary | ICD-10-CM

## 2019-09-09 DIAGNOSIS — Z0001 Encounter for general adult medical examination with abnormal findings: Secondary | ICD-10-CM | POA: Diagnosis not present

## 2019-09-09 DIAGNOSIS — M150 Primary generalized (osteo)arthritis: Secondary | ICD-10-CM

## 2019-09-09 DIAGNOSIS — M159 Polyosteoarthritis, unspecified: Secondary | ICD-10-CM

## 2019-09-09 DIAGNOSIS — R6889 Other general symptoms and signs: Secondary | ICD-10-CM | POA: Diagnosis not present

## 2019-09-09 DIAGNOSIS — I059 Rheumatic mitral valve disease, unspecified: Secondary | ICD-10-CM

## 2019-09-09 DIAGNOSIS — M8949 Other hypertrophic osteoarthropathy, multiple sites: Secondary | ICD-10-CM

## 2019-09-09 DIAGNOSIS — D649 Anemia, unspecified: Secondary | ICD-10-CM

## 2019-09-09 DIAGNOSIS — L97919 Non-pressure chronic ulcer of unspecified part of right lower leg with unspecified severity: Secondary | ICD-10-CM

## 2019-09-09 DIAGNOSIS — Z0000 Encounter for general adult medical examination without abnormal findings: Secondary | ICD-10-CM

## 2019-09-09 DIAGNOSIS — E441 Mild protein-calorie malnutrition: Secondary | ICD-10-CM

## 2019-09-09 DIAGNOSIS — Z853 Personal history of malignant neoplasm of breast: Secondary | ICD-10-CM

## 2019-09-09 NOTE — Patient Instructions (Signed)
Check out [employer] Light therapy Lamp, Ultra Thin UV Free 10000 Lux therapy Light on Amazon- 35-40 dollars.    Make sure you are on your vitamin D   Counseling services   I suggest calling your insurance and finding out who is in your network and THEN calling those people or looking them up on google.   I'm a big fan of Cognitive Behavioral Therapy, look this up on You tube or check with the therapist you see if they are certified.  This form of therapy helps to teach you skills to better handle with current situation that are causing anxiety or depression.   There are some great apps too Check out [place], give thanks app.  Meditations apps are great like headspace.    Major Depressive Disorder, Adult Major depressive disorder (MDD) is a mental health condition. MDD often makes you feel sad, hopeless, or helpless. MDD can also cause symptoms in your body. MDD can affect your:  Work.  School.  Relationships.  Other normal activities. MDD can range from mild to very bad. It may occur once (single episode MDD). It can also occur many times (recurrent MDD). The main symptoms of MDD often include:  Feeling sad, depressed, or irritable most of the time.  Loss of interest. MDD symptoms also include:  Sleeping too much or too little.  Eating too much or too little.  A change in your weight.  Feeling tired (fatigue) or having low energy.  Feeling worthless.  Feeling guilty.  Trouble making decisions.  Trouble thinking clearly.  Thoughts of suicide or harming others.  Feeling weak.  Feeling agitated.  Keeping yourself from being around other people (isolation). Follow these instructions at home: Activity  Do these things as told by your doctor: ? Go back to your normal activities. ? Exercise regularly. ? Spend time outdoors. Alcohol  Talk with your doctor about how alcohol can affect your antidepressant medicines.  Do not drink alcohol. Or, limit how much  alcohol you drink. ? This means no more than 1 drink a day for nonpregnant women and 2 drinks a day for men. One drink equals one of these:  12 oz of beer.  5 oz of wine.  1 oz of hard liquor. General instructions  Take over-the-counter and prescription medicines only as told by your doctor.  Eat a healthy diet.  Get plenty of sleep.  Find activities that you enjoy. Make time to do them.  Think about joining a support group. Your doctor may be able to suggest a group for you.  Keep all follow-up visits as told by your doctor. This is important. Where to find more information:  [employer] on Mental Illness: ? www.nami.[place]: ? https://carter.com/  National Suicide Prevention Lifeline: ? 209-732-5773. This is free, 24-hour help. Contact a doctor if:  Your symptoms get worse.  You have new symptoms. Get help right away if:  You self-harm.  You see, hear, taste, smell, or feel things that are not present (hallucinate). If you ever feel like you may hurt yourself or others, or have thoughts about taking your own life, get help right away. You can go to your nearest emergency department or call:  Your local emergency services (911 in the [place]  A suicide crisis helpline, such as the National Suicide Prevention Lifeline: ? 470-880-1680. This is open 24 hours a day. This information is not intended to replace advice given to you by your health care  provider. Make sure you discuss any questions you have with your health care provider. Document Revised: 04/19/2017 Document Reviewed: 01/22/2016 Elsevier Patient Education  [place].   Mitral Valve Regurgitation  Mitral valve regurgitation, also called mitral regurgitation, is a condition in which some blood leaks back (regurgitates) through the mitral valve in the heart. The mitral valve is located between the upper left chamber (left atrium) and the lower left  chamber (left ventricle) of the heart. When blood travels through the heart, it goes from the left atrium to the left ventricle and then out to the body. Normally, the mitral valve opens when the atrium pumps blood into the ventricle, and it closes when the ventricle pumps blood out to the body. Mitral valve regurgitation happens when the mitral valve does not close properly. As a result, blood in the ventricle leaks back into the atrium. Mitral valve regurgitation causes the heart to work harder to pump blood. If the condition is mild, a person may not have symptoms. However, over time, this can lead to heart failure. What are the causes? This condition may be caused by:  A condition in which the mitral valve does not close completely when the heart pumps blood (mitral valve prolapse).  Heart valve infection (endocarditis).  Certain types of heart disease.  A condition that causes inflammation of the heart, blood vessels, or joints (rheumatic fever).  Certain conditions that are present at birth (congenital heart defect).  Previous radiation therapy to the chest area.  Damage to the mitral valve, such as from injury (trauma) to the heart or a heart attack.  Certain combinations of weight-loss (anti-obesity) medicines. What are the signs or symptoms? In some cases of mild to moderate mitral regurgitation, there are no symptoms. Symptoms of this condition include:  Shortness of breath.  Fatigue.  Activity intolerance.  Cough. Severe symptoms of this condition include:  Suddenly waking up at night with difficulty breathing.  Fast or irregular heartbeat.  Swelling in the lower legs, ankles, and feet.  Fluid in the lungs that makes it very hard to breathe (pulmonary edema). How is this diagnosed? This condition may be diagnosed based on:  A physical exam. Your health care provider will listen to your heart for an abnormal heart sound (murmur).  Tests to confirm the diagnosis.  These may include: ? A test that creates ultrasound images of the heart (echocardiogram). This test allows your health care provider to see how the heart valves work while your heart is beating. ? Chest X-ray. ? A test that records the electrical impulses of the heart (electrocardiogram, or ECG). ? A test that looks at the structure and function of the heart (cardiac catheterization). How is this treated? This condition may be treated with:  Medicines. These may be given to treat symptoms and prevent complications.  Surgery or catheter-based procedures to repair or replace the mitral valve. This may be done in severe, long-term (chronic) cases. Follow these instructions at home: Eating and drinking   Eat a heart-healthy diet that includes whole grains, fresh fruits and vegetables, low-fat (lean) proteins, and low-fat or nonfat dairy products. Consider working with a dietitian to help you make healthy food choices.  Limit how much salt (sodium) you eat as told by your health care provider. Follow instructions from your health care provider about any other eating or drinking restrictions, such as limiting foods that are high in fat and processed sugars.  Use healthy cooking methods, such as roasting, grilling, broiling,  baking, poaching, steaming, or stir-frying. Alcohol use  Do not drink alcohol if: ? Your health care provider tells you not to drink. ? You are pregnant, may be pregnant, or are planning to become pregnant.  If you drink alcohol: ? Limit how much you use to:  0-1 drink a day for women.  0-2 drinks a day for men. ? Be aware of how much alcohol is in your drink. In the [place] one drink equals one 12 oz bottle of beer (355 mL), one 5 oz glass of wine (148 mL), or one 1 oz glass of hard liquor (44 mL). Activity  Return to your normal activities as told by your health care provider. Ask your health care provider what activities are safe for you.  Regular exercise is  important for the health of your heart and for maintaining a healthy weight. Ask your health care provider what type of exercise is safe for you. You may need to avoid strenuous exercise. Lifestyle  Maintain a healthy weight.  Do not use any products that contain nicotine or tobacco, such as cigarettes, e-cigarettes, and chewing tobacco. If you need help quitting, ask your health care provider.  Find ways to manage stress. If you need help with this, ask your health care provider. General instructions  Take over-the-counter and prescription medicines only as told by your health care provider.  Work closely with your health care provider to manage any other health conditions you have, such as diabetes or high blood pressure.  If you plan to become pregnant, talk with your health care provider first.  Keep all follow-up visits as told by your health care provider. This is important. Contact a health care provider if you:  Have a fever.  Feel more tired than usual when doing physical activity.  Have a dry cough. Get help right away if you:  Have shortness of breath.  Develop chest pain.  Have swelling in your hands, feet, ankles, or abdomen that is getting worse.  Have trouble staying awake or you faint.  Feel dizzy or unsteady.  Suddenly gain weight.  Feel confused. These symptoms may represent a serious problem that is an emergency. Do not wait to see if the symptoms will go away. Get medical help right away. Call your local emergency services (911 in the [place] Do not drive yourself to the hospital. Summary  Mitral valve regurgitation, also called mitral regurgitation, is a condition in which some blood leaks back (regurgitates) through the mitral valve in the heart.  Depending on how severe your condition is, you may be treated with medicines, catheter-based procedures, or surgery.  Practice heart-healthy habits to manage this condition. These include limiting alcohol,  avoiding nicotine and tobacco, and eating a heart-healthy diet that is low in salt (sodium). This information is not intended to replace advice given to you by your health care provider. Make sure you discuss any questions you have with your health care provider. Document Revised: 04/20/2018 Document Reviewed: 04/20/2018 Elsevier Patient Education  2020 [employer].

## 2019-09-10 ENCOUNTER — Encounter: Payer: Self-pay | Admitting: Physician Assistant

## 2019-09-10 LAB — COMPLETE METABOLIC PANEL WITH GFR
AG Ratio: 1.9 (calc) (ref 1.0–2.5)
ALT: 17 U/L (ref 6–29)
AST: 20 U/L (ref 10–35)
Albumin: 4.1 g/dL (ref 3.6–5.1)
Alkaline phosphatase (APISO): 58 U/L (ref 37–153)
BUN: 10 mg/dL (ref 7–25)
CO2: 27 mmol/L (ref 20–32)
Calcium: 9.5 mg/dL (ref 8.6–10.4)
Chloride: 98 mmol/L (ref 98–110)
Creat: 0.75 mg/dL (ref 0.60–0.93)
GFR, Est African American: 90 mL/min/{1.73_m2} (ref >=60)
GFR, Est Non African American: 77 mL/min/{1.73_m2} (ref >=60)
Globulin: 2.2 g/dL (calc) (ref 1.9–3.7)
Glucose, Bld: 83 mg/dL (ref 65–99)
Potassium: 4.4 mmol/L (ref 3.5–5.3)
Sodium: 133 mmol/L — ABNORMAL LOW (ref 135–146)
Total Bilirubin: 0.7 mg/dL (ref 0.2–1.2)
Total Protein: 6.3 g/dL (ref 6.1–8.1)

## 2019-09-10 LAB — TSH: TSH: 0.73 mIU/L (ref 0.40–4.50)

## 2019-09-10 LAB — CBC WITH DIFFERENTIAL/PLATELET
Absolute Monocytes: 545 cells/uL (ref 200–950)
Basophils Absolute: 90 cells/uL (ref 0–200)
Basophils Relative: 1.8 %
Eosinophils Absolute: 40 cells/uL (ref 15–500)
Eosinophils Relative: 0.8 %
HCT: 34.4 % — ABNORMAL LOW (ref 35.0–45.0)
Hemoglobin: 11.5 g/dL — ABNORMAL LOW (ref 11.7–15.5)
Lymphs Abs: 1475 cells/uL (ref 850–3900)
MCH: 31.9 pg (ref 27.0–33.0)
MCHC: 33.4 g/dL (ref 32.0–36.0)
MCV: 95.3 fL (ref 80.0–100.0)
MPV: 12.5 fL (ref 7.5–12.5)
Monocytes Relative: 10.9 %
Neutro Abs: 2850 cells/uL (ref 1500–7800)
Neutrophils Relative %: 57 %
Platelets: 165 10*3/uL (ref 140–400)
RBC: 3.61 10*6/uL — ABNORMAL LOW (ref 3.80–5.10)
RDW: 11.7 % (ref 11.0–15.0)
Total Lymphocyte: 29.5 %
WBC: 5 10*3/uL (ref 3.8–10.8)

## 2019-09-10 LAB — MAGNESIUM: Magnesium: 2.1 mg/dL (ref 1.5–2.5)

## 2019-09-10 LAB — LIPID PANEL
Cholesterol: 209 mg/dL — ABNORMAL HIGH (ref <200)
HDL: 104 mg/dL (ref >=50)
LDL Cholesterol (Calc): 88 mg/dL (calc)
Non-HDL Cholesterol (Calc): 105 mg/dL (calc) (ref <130)
Total CHOL/HDL Ratio: 2 (calc) (ref <5.0)
Triglycerides: 81 mg/dL (ref <150)

## 2019-09-10 LAB — VITAMIN D 25 HYDROXY (VIT D DEFICIENCY, FRACTURES): Vit D, 25-Hydroxy: 74 ng/mL (ref 30–100)

## 2019-10-14 ENCOUNTER — Ambulatory Visit: Payer: Medicare Other | Admitting: Vascular Surgery

## 2019-10-15 NOTE — Progress Notes (Signed)
Patient declines DEXA

## 2019-11-16 ENCOUNTER — Ambulatory Visit
Admission: RE | Admit: 2019-11-16 | Discharge: 2019-11-16 | Disposition: A | Discharge status: Home / Self Care | Payer: Medicare Other | Source: Ambulatory Visit | Attending: Physician Assistant | Admitting: Physician Assistant

## 2019-11-16 DIAGNOSIS — J449 Chronic obstructive pulmonary disease, unspecified: Secondary | ICD-10-CM

## 2019-11-16 DIAGNOSIS — R05 Cough: Secondary | ICD-10-CM | POA: Diagnosis not present

## 2019-12-20 NOTE — Progress Notes (Signed)
Assessment and Plan: Johara was seen today for advice only.  Diagnoses and all orders for this visit:  Mild malnutrition ([place]) Monitor weight  Anxiety -     busPIRone (BUSPAR) 5 MG tablet; 1/2-1 tablet as needed 3 x a day for anxiety Does not want long term medication. Will do buspar low dose Close follow up at CPE in 1 month     Future Appointments  Date Time Provider [place]  01/06/2020  1:05 PM [facility] ECHO 1 [id number]  01/27/2020  2:00 PM Vicie Mutters, PA-C [facility] None     HPI 79 y.o.female presents for advise/evaluation.  Her sister is in long term care community x 1 year. She has volunteered [facility] and has friends that are there. She applied there and her application was accepted but she has to be moved in by Dec.  She states her youngest son, Merry Proud, thought that was a good idea but her eldest son thought it was not a good idea.  She has been in knots with thinking about this choice.   She walks daily for 2.5 miles a day, she has a dog, very active, was volunteering but now she is not able to due [place].   Patient Active Problem List   Diagnosis Date Noted  . Unilateral primary osteoarthritis, right knee 05/13/2019  . Varicose veins with ulcer, right ([place]) 02/16/2019  . COPD (chronic obstructive pulmonary disease) ([place]) 12/09/2015  . Mild malnutrition ([place]) 12/08/2015  . DJD (degenerative joint disease) 07/28/2015  . Mixed hyperlipidemia 11/15/2014  . Osteoporosis 11/15/2014  . Anemia   . History of breast cancer   . Vitamin D deficiency   . 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  albuterol (VENTOLIN HFA) 108 (90 Base) MCG/ACT inhaler, Inhale 2 puffs into the lungs every 4 (four) hours as needed for wheezing or shortness of breath.   Current Outpatient Medications (Hematological):  Marland Kitchen  Cyanocobalamin (VITAMIN B 12 PO), Take 500 mcg by mouth daily.   Current Outpatient  Medications (Other):  Marland Kitchen  Ascorbic Acid (VITAMIN C) 1000 MG tablet, Take 500 mg by mouth daily.  .  Cholecalciferol (VITAMIN D PO), Take 2,000 Int'l Units by mouth daily. Marland Kitchen  MAGNESIUM PO, Take 500 mg by mouth daily.  .  Multiple Vitamins-Minerals (CENTRUM SILVER PO), Take by mouth. .  Zinc 30 MG TABS, Take by mouth daily. .  busPIRone (BUSPAR) 5 MG tablet, 1/2-1 tablet as needed 3 x a day for anxiety  Allergies  Allergen Reactions  . Covid-19 Mrna Vacc (Moderna) Rash    Patient states 1 week after moderna had severe blistering rash from left shoulder down to left elbow, will not get second shot    ROS: all negative except above.   Physical Exam: Filed Weights   12/21/19 1602  Weight: 95 lb 9.6 oz (43.4 kg)   BP 102/60   Pulse 76   Temp 97.7 F (36.5 C)   Wt 95 lb 9.6 oz (43.4 kg)   LMP  (LMP Unknown)   SpO2 98%   BMI 16.41 kg/m  General appearance: alert, no distress, thin appearing, WD/WN, female HEENT: normocephalic, sclerae anicteric, TMs pearly, nares patent, no discharge or erythema, pharynx normal Oral cavity: MMM, no lesions Neck: supple, no lymphadenopathy, no thyromegaly, no masses Heart: RRR, normal S1, S2, with 4/6 holosystolic murmur Lungs: CTA bilaterally, no wheezes, rhonchi, or rales Abdomen: +bs, soft, non tender, non distended,  no masses, no hepatomegaly, no splenomegaly Chest: s/p bilateral mastectomy without any lumps, barrel chest.  Musculoskeletal: nontender, no swelling, no obvious deformity Extremities: no edema, no cyanosis, no clubbing Pulses: 2+ symmetric, upper and lower extremities, normal cap refill Neurological: alert, oriented x 3, CN2-12 intact, strength normal upper extremities and lower extremities, sensation normal throughout, DTRs 2+ throughout, no cerebellar signs, gait normal Psychiatric: normal affect, behavior normal, pleasant    Vicie Mutters, PA-C 4:37 PM [facility]

## 2019-12-21 ENCOUNTER — Ambulatory Visit (INDEPENDENT_AMBULATORY_CARE_PROVIDER_SITE_OTHER): Payer: Medicare Other | Admitting: Physician Assistant

## 2019-12-21 ENCOUNTER — Other Ambulatory Visit: Payer: Self-pay

## 2019-12-21 ENCOUNTER — Encounter: Payer: Self-pay | Admitting: Physician Assistant

## 2019-12-21 VITALS — BP 102/60 | HR 76 | Temp 97.7°F | Wt 95.6 lb

## 2019-12-21 DIAGNOSIS — F419 Anxiety disorder, unspecified: Secondary | ICD-10-CM | POA: Diagnosis not present

## 2019-12-21 DIAGNOSIS — E441 Mild protein-calorie malnutrition: Secondary | ICD-10-CM | POA: Diagnosis not present

## 2019-12-21 MED ORDER — BUSPIRONE HCL 5 MG PO TABS: ORAL_TABLET | ORAL | 0 refills | Status: DC

## 2019-12-21 NOTE — Patient Instructions (Signed)
Generalized Anxiety Disorder, Adult Generalized anxiety disorder (GAD) is a mental health disorder. People with this condition constantly worry about everyday events. Unlike normal anxiety, worry related to GAD is not triggered by a specific event. These worries also do not fade or get better with time. GAD interferes with life functions, including relationships, work, and school. GAD can vary from mild to severe. People with severe GAD can have intense waves of anxiety with physical symptoms (panic attacks). What are the causes? The exact cause of GAD is not known. What increases the risk? This condition is more likely to develop in:  Women.  People who have a family history of anxiety disorders.  People who are very shy.  People who experience very stressful life events, such as the death of a loved one.  People who have a very stressful family environment. What are the signs or symptoms? People with GAD often worry excessively about many things in their lives, such as their health and family. They may also be overly concerned about:  Doing well at work.  Being on time.  Natural disasters.  Friendships. Physical symptoms of GAD include:  Fatigue.  Muscle tension or having muscle twitches.  Trembling or feeling shaky.  Being easily startled.  Feeling like your heart is pounding or racing.  Feeling out of breath or like you cannot take a deep breath.  Having trouble falling asleep or staying asleep.  Sweating.  Nausea, diarrhea, or irritable bowel syndrome (IBS).  Headaches.  Trouble concentrating or remembering facts.  Restlessness.  Irritability. How is this diagnosed? Your health care provider can diagnose GAD based on your symptoms and medical history. You will also have a physical exam. The health care provider will ask specific questions about your symptoms, including how severe they are, when they started, and if they come and go. Your health care  provider may ask you about your use of alcohol or drugs, including prescription medicines. Your health care provider may refer you to a mental health specialist for further evaluation. Your health care provider will do a thorough examination and may perform additional tests to rule out other possible causes of your symptoms. To be diagnosed with GAD, a person must have anxiety that:  Is out of his or her control.  Affects several different aspects of his or her life, such as work and relationships.  Causes distress that makes him or her unable to take part in normal activities.  Includes at least three physical symptoms of GAD, such as restlessness, fatigue, trouble concentrating, irritability, muscle tension, or sleep problems. Before your health care provider can confirm a diagnosis of GAD, these symptoms must be present more days than they are not, and they must last for six months or longer. How is this treated? The following therapies are usually used to treat GAD:  Medicine. Antidepressant medicine is usually prescribed for long-term daily control. Antianxiety medicines may be added in severe cases, especially when panic attacks occur.  Talk therapy (psychotherapy). Certain types of talk therapy can be helpful in treating GAD by providing support, education, and guidance. Options include: ? Cognitive behavioral therapy (CBT). People learn coping skills and techniques to ease their anxiety. They learn to identify unrealistic or negative thoughts and behaviors and to replace them with positive ones. ? Acceptance and commitment therapy (ACT). This treatment teaches people how to be mindful as a way to cope with unwanted thoughts and feelings. ? Biofeedback. This process trains you to manage your body's response (  physiological response) through breathing techniques and relaxation methods. You will work with a therapist while machines are used to monitor your physical symptoms.  Stress  management techniques. These include yoga, meditation, and exercise. A mental health specialist can help determine which treatment is best for you. Some people see improvement with one type of therapy. However, other people require a combination of therapies. Follow these instructions at home:  Take over-the-counter and prescription medicines only as told by your health care provider.  Try to maintain a normal routine.  Try to anticipate stressful situations and allow extra time to manage them.  Practice any stress management or self-calming techniques as taught by your health care provider.  Do not punish yourself for setbacks or for not making progress.  Try to recognize your accomplishments, even if they are small.  Keep all follow-up visits as told by your health care provider. This is important. Contact a health care provider if:  Your symptoms do not get better.  Your symptoms get worse.  You have signs of depression, such as: ? A persistently sad, cranky, or irritable mood. ? Loss of enjoyment in activities that used to bring you joy. ? Change in weight or eating. ? Changes in sleeping habits. ? Avoiding friends or family members. ? Loss of energy for normal tasks. ? Feelings of guilt or worthlessness. Get help right away if:  You have serious thoughts about hurting yourself or others. If you ever feel like you may hurt yourself or others, or have thoughts about taking your own life, get help right away. You can go to your nearest emergency department or call:  Your local emergency services (911 in the [place]  A suicide crisis helpline, such as the [facility] at (970)307-0352. This is open 24 hours a day. Summary  Generalized anxiety disorder (GAD) is a mental health disorder that involves worry that is not triggered by a specific event.  People with GAD often worry excessively about many things in their lives, such as their health and  family.  GAD may cause physical symptoms such as restlessness, trouble concentrating, sleep problems, frequent sweating, nausea, diarrhea, headaches, and trembling or muscle twitching.  A mental health specialist can help determine which treatment is best for you. Some people see improvement with one type of therapy. However, other people require a combination of therapies. This information is not intended to replace advice given to you by your health care provider. Make sure you discuss any questions you have with your health care provider. Document Revised: 04/19/2017 Document Reviewed: 03/27/2016 Elsevier Patient Education  [place].   Buspirone tablets What is this medicine? BUSPIRONE (byoo SPYE rone) is used to treat anxiety disorders. This medicine may be used for other purposes; ask your health care provider or pharmacist if you have questions. COMMON BRAND NAME(S): BuSpar What should I tell my health care provider before I take this medicine? They need to know if you have any of these conditions:  kidney or liver disease  an unusual or allergic reaction to buspirone, other medicines, foods, dyes, or preservatives  pregnant or trying to get pregnant  breast-feeding How should I use this medicine? Take this medicine by mouth with a glass of water. Follow the directions on the prescription label. You may take this medicine with or without food. To ensure that this medicine always works the same way for you, you should take it either always with or always without food. Take your doses at  regular intervals. Do not take your medicine more often than directed. Do not stop taking except on the advice of your doctor or health care professional. Talk to your pediatrician regarding the use of this medicine in children. Special care may be needed. Overdosage: If you think you have taken too much of this medicine contact a poison control center or emergency room at once. NOTE: This  medicine is only for you. Do not share this medicine with others. What if I miss a dose? If you miss a dose, take it as soon as you can. If it is almost time for your next dose, take only that dose. Do not take double or extra doses. What may interact with this medicine? Do not take this medicine with any of the following medications:  linezolid  MAOIs like Carbex, Eldepryl, Marplan, Nardil, and Parnate  methylene blue  procarbazine This medicine may also interact with the following medications:  diazepam  digoxin  diltiazem  erythromycin  grapefruit juice  haloperidol  medicines for mental depression or mood problems  medicines for seizures like carbamazepine, phenobarbital and phenytoin  nefazodone  other medications for anxiety  rifampin  ritonavir  some antifungal medicines like itraconazole, ketoconazole, and voriconazole  verapamil  warfarin This list may not describe all possible interactions. Give your health care provider a list of all the medicines, herbs, non-prescription drugs, or dietary supplements you use. Also tell them if you smoke, drink alcohol, or use illegal drugs. Some items may interact with your medicine. What should I watch for while using this medicine? Visit your doctor or health care professional for regular checks on your progress. It may take 1 to 2 weeks before your anxiety gets better. You may get drowsy or dizzy. Do not drive, use machinery, or do anything that needs mental alertness until you know how this drug affects you. Do not stand or sit up quickly, especially if you are an older patient. This reduces the risk of dizzy or fainting spells. Alcohol can make you more drowsy and dizzy. Avoid alcoholic drinks. What side effects may I notice from receiving this medicine? Side effects that you should report to your doctor or health care professional as soon as possible:  blurred vision or other vision changes  chest  pain  confusion  difficulty breathing  feelings of hostility or anger  muscle aches and pains  numbness or tingling in hands or feet  ringing in the ears  skin rash and itching  vomiting  weakness Side effects that usually do not require medical attention (report to your doctor or health care professional if they continue or are bothersome):  disturbed dreams, nightmares  headache  nausea  restlessness or nervousness  sore throat and nasal congestion  stomach upset This list may not describe all possible side effects. Call your doctor for medical advice about side effects. You may report side effects to FDA at 1-800-FDA-1088. Where should I keep my medicine? Keep out of the reach of children. Store at room temperature below 30 degrees C (86 degrees F). Protect from light. Keep container tightly closed. Throw away any unused medicine after the expiration date. NOTE: This sheet is a summary. It may not cover all possible information. If you have questions about this medicine, talk to your doctor, pharmacist, or health care provider.  2020 Elsevier/Gold Standard (2009-12-15 18:06:11)

## 2020-01-06 ENCOUNTER — Ambulatory Visit (HOSPITAL_COMMUNITY): Payer: Medicare Other | Attending: Cardiovascular Disease

## 2020-01-06 ENCOUNTER — Other Ambulatory Visit: Payer: Self-pay

## 2020-01-06 DIAGNOSIS — I340 Nonrheumatic mitral (valve) insufficiency: Secondary | ICD-10-CM | POA: Insufficient documentation

## 2020-01-06 DIAGNOSIS — I059 Rheumatic mitral valve disease, unspecified: Secondary | ICD-10-CM | POA: Diagnosis not present

## 2020-01-06 LAB — ECHOCARDIOGRAM COMPLETE
Area-P 1/2: 3.37 cm2
MV M vel: 5.91 m/s
MV Peak grad: 139.7 mmHg
P 1/2 time: 566 msec
S' Lateral: 3.9 cm

## 2020-01-26 NOTE — Progress Notes (Signed)
CPE AND 3 MONTH FOLLOW UP  Assessment:    Severe mitral regurgitation Has cardiology appointment  Chronic obstructive pulmonary disease, unspecified COPD type ([place])   continue to avoid triggers, call if any change in symptoms, continue exercise- albuterol sent in  Osteoporosis, unspecified osteoporosis type, unspecified pathological fracture presence last one 2011, DECLINES-continue vitamin D  Anemia, unspecified type -     CBC with Differential/Platelet  History of breast cancer S/p mastectomy no need for MGM  Mild malnutrition ([place]) Add ensure -     Hepatic function panel  Mixed hyperlipidemia -continue medications, check lipids, decrease fatty foods, increase activity.  -     Lipid panel  Vitamin D deficiency -     continue supplement  Primary osteoarthritis involving multiple joints Continue walking  Elevated BP without diagnosis of hypertension - continue medications, DASH diet, exercise and monitor at home. Call if greater than 130/80.   Medication management  cpe Declines cologuard or colonscopy  Screening for hematuria or proteinuria -     Urinalysis, Routine w reflex microscopic -     Microalbumin / creatinine urine ratio  Over 40 minutes of exam, counseling, chart review and critical decision making was performed Future Appointments  Date Time Provider [place]  02/15/2020  2:40 PM [name], MD [facility]  01/26/2021  2:00 PM Liane Comber, NP [facility] None    Subjective:  Helen Davis is a 79 y.o. female who presents for CPE and 3 month follow up.   She is following with Dr. Oneida Alar for varicose veins, had ulcer on right lower leg that bleed recently, following with him. Going to get on compression socks.   Her blood pressure has been controlled at home, today their BP is BP: 100/62 She does workout, walks with chipy her dog, 2-3 mile walk. She denies chest pain, shortness of breath, dizziness.  She has had  some palpitations, fatigue with her walking lately, had echo that showed severe MVR, has appointment with cardiology this month.  She has COPD, could not afford Symbicort, has albuterol and states with cooler weather it is better.  She is not on cholesterol medication and denies myalgias. Her cholesterol is at goal. The cholesterol last visit was:   Lab Results  Component Value Date   CHOL 209 (H) 09/09/2019   HDL 104 09/09/2019   LDLCALC 88 09/09/2019   TRIG 81 09/09/2019   CHOLHDL 2.0 09/09/2019    Last A1C in the office was:  Lab Results  Component Value Date   HGBA1C 4.7 06/11/2016   Patient is on Vitamin D supplement.   Lab Results  Component Value Date   VD25OH 51 09/09/2019     She is retired.   She is not going to counseling anymore. S/p double mastectomy, does self breast exams.  She has osteoporosis and has history of lumbar compression fracture.  BMI is Body mass index is 16.62 kg/m., she is working on increasing her weight.  Wt Readings from Last 3 Encounters:  01/27/20 96 lb 12.8 oz (43.9 kg)  12/21/19 95 lb 9.6 oz (43.4 kg)  09/09/19 97 lb 6.4 oz (44.2 kg)   Lab Results  Component Value Date   VITAMINB12 498 12/08/2015     Medication Review: Current Outpatient Medications on File Prior to Visit  Medication Sig Dispense Refill  . albuterol (VENTOLIN HFA) 108 (90 Base) MCG/ACT inhaler Inhale 2 puffs into the lungs every 4 (four) hours as needed for wheezing or shortness of  breath. 1 Inhaler 0  . Ascorbic Acid (VITAMIN C) 1000 MG tablet Take 500 mg by mouth daily.     . busPIRone (BUSPAR) 5 MG tablet 1/2-1 tablet as needed 3 x a day for anxiety 30 tablet 0  . Cholecalciferol (VITAMIN D PO) Take 2,000 Int'l Units by mouth daily.    . Cyanocobalamin (VITAMIN B 12 PO) Take 500 mcg by mouth daily.     Marland Kitchen MAGNESIUM PO Take 500 mg by mouth daily.     . Multiple Vitamins-Minerals (CENTRUM SILVER PO) Take by mouth.    . Zinc 30 MG TABS Take by mouth daily.     No  current facility-administered medications on file prior to visit.    Current Problems (verified) Patient Active Problem List   Diagnosis Date Noted  . Unilateral primary osteoarthritis, right knee 05/13/2019  . Varicose veins with ulcer, right ([place]) 02/16/2019  . COPD (chronic obstructive pulmonary disease) ([place]) 12/09/2015  . Mild malnutrition ([place]) 12/08/2015  . DJD (degenerative joint disease) 07/28/2015  . Mixed hyperlipidemia 11/15/2014  . Osteoporosis 11/15/2014  . Anemia   . History of breast cancer   . Vitamin D deficiency   . Severe mitral regurgitation 10/11/2009   Screening Tests Preventative care:  Immunization History  Administered Date(s) Administered  . Pneumococcal Conjugate-13 02/12/2018  . Pneumococcal Polysaccharide-23 02/16/2019  . Tdap 09/18/2009   Last colonoscopy: 2013  Last mammogram: 1999 s/p bilateral mastectomy Last pap smear/pelvic exam: 2013 declines another DEXA: 2013 Osteoporosis, was on fosamax in the past (2002), declines repeat eval CXR 08/2017 Echo 2011  Prior vaccinations: TD or Tdap: 2011  Influenza: declines  Pneumococcal: 2020 Prevnar13:2019 Shingles/Zostavax: declines  Names of Other Physician/Practitioners you currently use: 1. [facility] here for primary care 2. Dr. Talbert Forest, eye doctor  3. Dr. Bobby Rumpf, dentist Patient Care Team: Vicie Mutters, PA-C as PCP - General (Physician Assistant) Darleen Crocker, MD as Consulting Physician (Ophthalmology) Richmond Campbell, MD as Consulting Physician (Gastroenterology) Druscilla Brownie, MD as Consulting Physician (Dermatology) Garald Balding, MD as Consulting Physician (Orthopedic Surgery) Unice Bailey, MD as Consulting Physician (Rheumatology) Victorino Dike, DDS as Consulting Physician (Dentistry) Larey Dresser, MD as Consulting Physician (Cardiology)  Allergies Allergies  Allergen Reactions  . Covid-19 Mrna Vacc (Moderna) Rash     Patient states 1 week after moderna had severe blistering rash from left shoulder down to left elbow, will not get second shot    SURGICAL HISTORY She  has a past surgical history that includes Mastectomy; Appendectomy (1966); and Varicose vein surgery (Right, 1975). FAMILY HISTORY Her family history includes Atrial fibrillation in her mother; Cancer in an other family member; Dementia in her father; Emphysema in her father; Heart disease in her sister; Hypertension in an other family member; Stroke in her mother and another family member. SOCIAL HISTORY She  reports that she quit smoking about 47 years ago. She has never used smokeless tobacco. She reports current alcohol use of about 3.0 standard drinks of alcohol per week. She reports that she does not use drugs.   Review of Systems  Constitutional: Negative.   HENT: Negative.   Eyes: Negative.   Respiratory: Positive for cough and sputum production (clear, not increased). Negative for hemoptysis, shortness of breath and wheezing.   Cardiovascular: Positive for palpitations and leg swelling (has had for years, better in the AM, better with exercise). Negative for chest pain, orthopnea, claudication and PND.  Gastrointestinal: Negative.   Genitourinary: Negative.  Musculoskeletal: Negative.   Skin: Negative.   Neurological: Negative.  Negative for dizziness.  Psychiatric/Behavioral: Positive for depression (mild). Negative for hallucinations, memory loss, substance abuse and suicidal ideas. The patient is not nervous/anxious and does not have insomnia.    Objective:     Blood pressure 100/62, pulse 63, temperature 97.6 F (36.4 C), height 5\' 4"  (1.626 m), weight 96 lb 12.8 oz (43.9 kg), SpO2 95 %. Body mass index is 16.62 kg/m.  General appearance: alert, no distress, thin appearing, WD/WN, female HEENT: normocephalic, sclerae anicteric, TMs pearly, nares patent, no discharge or erythema, pharynx normal Oral cavity: MMM, no  lesions Neck: supple, no lymphadenopathy, no thyromegaly, no masses Heart: RRR, normal S1, S2, with 4/6 holosystolic murmur Lungs: CTA bilaterally, no wheezes, rhonchi, or rales Abdomen: +bs, soft, non tender, non distended, no masses, no hepatomegaly, no splenomegaly Chest: s/p bilateral mastectomy without any lumps, barrel chest.  Musculoskeletal: nontender, no swelling, no obvious deformity Extremities: mild peripheral edema, no cyanosis, no clubbing Pulses: 2+ symmetric, upper and lower extremities, normal cap refill Neurological: alert, oriented x 3, CN2-12 intact, strength normal upper extremities and lower extremities, sensation normal throughout, DTRs 2+ throughout, no cerebellar signs, gait normal Psychiatric: normal affect, behavior normal, pleasant      Vicie Mutters, PA-C   01/27/2020

## 2020-01-27 ENCOUNTER — Ambulatory Visit (INDEPENDENT_AMBULATORY_CARE_PROVIDER_SITE_OTHER): Payer: Medicare Other | Admitting: Physician Assistant

## 2020-01-27 ENCOUNTER — Other Ambulatory Visit: Payer: Self-pay

## 2020-01-27 ENCOUNTER — Encounter: Payer: Self-pay | Admitting: Physician Assistant

## 2020-01-27 VITALS — BP 100/62 | HR 63 | Temp 97.6°F | Ht 64.0 in | Wt 96.8 lb

## 2020-01-27 DIAGNOSIS — Z853 Personal history of malignant neoplasm of breast: Secondary | ICD-10-CM

## 2020-01-27 DIAGNOSIS — E538 Deficiency of other specified B group vitamins: Secondary | ICD-10-CM | POA: Diagnosis not present

## 2020-01-27 DIAGNOSIS — E559 Vitamin D deficiency, unspecified: Secondary | ICD-10-CM | POA: Diagnosis not present

## 2020-01-27 DIAGNOSIS — E441 Mild protein-calorie malnutrition: Secondary | ICD-10-CM

## 2020-01-27 DIAGNOSIS — E782 Mixed hyperlipidemia: Secondary | ICD-10-CM

## 2020-01-27 DIAGNOSIS — I83019 Varicose veins of right lower extremity with ulcer of unspecified site: Secondary | ICD-10-CM

## 2020-01-27 DIAGNOSIS — M1711 Unilateral primary osteoarthritis, right knee: Secondary | ICD-10-CM

## 2020-01-27 DIAGNOSIS — I340 Nonrheumatic mitral (valve) insufficiency: Secondary | ICD-10-CM

## 2020-01-27 DIAGNOSIS — Z1389 Encounter for screening for other disorder: Secondary | ICD-10-CM

## 2020-01-27 DIAGNOSIS — L97919 Non-pressure chronic ulcer of unspecified part of right lower leg with unspecified severity: Secondary | ICD-10-CM

## 2020-01-27 DIAGNOSIS — Z0001 Encounter for general adult medical examination with abnormal findings: Secondary | ICD-10-CM

## 2020-01-27 DIAGNOSIS — Z79899 Other long term (current) drug therapy: Secondary | ICD-10-CM

## 2020-01-27 DIAGNOSIS — M810 Age-related osteoporosis without current pathological fracture: Secondary | ICD-10-CM

## 2020-01-27 DIAGNOSIS — Z0000 Encounter for general adult medical examination without abnormal findings: Secondary | ICD-10-CM | POA: Diagnosis not present

## 2020-01-27 DIAGNOSIS — D649 Anemia, unspecified: Secondary | ICD-10-CM

## 2020-01-27 DIAGNOSIS — M159 Polyosteoarthritis, unspecified: Secondary | ICD-10-CM

## 2020-01-27 DIAGNOSIS — M8949 Other hypertrophic osteoarthropathy, multiple sites: Secondary | ICD-10-CM

## 2020-01-27 DIAGNOSIS — J449 Chronic obstructive pulmonary disease, unspecified: Secondary | ICD-10-CM

## 2020-01-28 LAB — LIPID PANEL
Cholesterol: 211 mg/dL — ABNORMAL HIGH (ref <200)
HDL: 107 mg/dL (ref >=50)
LDL Cholesterol (Calc): 84 mg/dL (calc)
Non-HDL Cholesterol (Calc): 104 mg/dL (calc) (ref <130)
Total CHOL/HDL Ratio: 2 (calc) (ref <5.0)
Triglycerides: 102 mg/dL (ref <150)

## 2020-01-28 LAB — URINALYSIS, ROUTINE W REFLEX MICROSCOPIC
Bilirubin Urine: NEGATIVE
Glucose, UA: NEGATIVE
Hgb urine dipstick: NEGATIVE
Ketones, ur: NEGATIVE
Leukocytes,Ua: NEGATIVE
Nitrite: NEGATIVE
Protein, ur: NEGATIVE
Specific Gravity, Urine: 1.012 (ref 1.001–1.03)
pH: 6 (ref 5.0–8.0)

## 2020-01-28 LAB — COMPLETE METABOLIC PANEL WITH GFR
AG Ratio: 1.9 (calc) (ref 1.0–2.5)
ALT: 16 U/L (ref 6–29)
AST: 19 U/L (ref 10–35)
Albumin: 4.2 g/dL (ref 3.6–5.1)
Alkaline phosphatase (APISO): 61 U/L (ref 37–153)
BUN: 12 mg/dL (ref 7–25)
CO2: 27 mmol/L (ref 20–32)
Calcium: 9.5 mg/dL (ref 8.6–10.4)
Chloride: 98 mmol/L (ref 98–110)
Creat: 0.73 mg/dL (ref 0.60–0.93)
GFR, Est African American: 93 mL/min/{1.73_m2} (ref >=60)
GFR, Est Non African American: 80 mL/min/{1.73_m2} (ref >=60)
Globulin: 2.2 g/dL (calc) (ref 1.9–3.7)
Glucose, Bld: 85 mg/dL (ref 65–99)
Potassium: 4.3 mmol/L (ref 3.5–5.3)
Sodium: 131 mmol/L — ABNORMAL LOW (ref 135–146)
Total Bilirubin: 0.7 mg/dL (ref 0.2–1.2)
Total Protein: 6.4 g/dL (ref 6.1–8.1)

## 2020-01-28 LAB — CBC WITH DIFFERENTIAL/PLATELET
Absolute Monocytes: 742 cells/uL (ref 200–950)
Basophils Absolute: 101 cells/uL (ref 0–200)
Basophils Relative: 1.9 %
Eosinophils Absolute: 80 cells/uL (ref 15–500)
Eosinophils Relative: 1.5 %
HCT: 34.1 % — ABNORMAL LOW (ref 35.0–45.0)
Hemoglobin: 11.6 g/dL — ABNORMAL LOW (ref 11.7–15.5)
Lymphs Abs: 1362 cells/uL (ref 850–3900)
MCH: 32.3 pg (ref 27.0–33.0)
MCHC: 34 g/dL (ref 32.0–36.0)
MCV: 95 fL (ref 80.0–100.0)
MPV: 12.4 fL (ref 7.5–12.5)
Monocytes Relative: 14 %
Neutro Abs: 3016 cells/uL (ref 1500–7800)
Neutrophils Relative %: 56.9 %
Platelets: 211 10*3/uL (ref 140–400)
RBC: 3.59 10*6/uL — ABNORMAL LOW (ref 3.80–5.10)
RDW: 11.8 % (ref 11.0–15.0)
Total Lymphocyte: 25.7 %
WBC: 5.3 10*3/uL (ref 3.8–10.8)

## 2020-01-28 LAB — MAGNESIUM: Magnesium: 2 mg/dL (ref 1.5–2.5)

## 2020-01-28 LAB — VITAMIN B12: Vitamin B-12: 867 pg/mL (ref 200–1100)

## 2020-01-28 LAB — VITAMIN D 25 HYDROXY (VIT D DEFICIENCY, FRACTURES): Vit D, 25-Hydroxy: 70 ng/mL (ref 30–100)

## 2020-01-28 LAB — MICROALBUMIN / CREATININE URINE RATIO
Creatinine, Urine: 38 mg/dL (ref 20–275)
Microalb Creat Ratio: 5 mcg/mg creat (ref <30)
Microalb, Ur: 0.2 mg/dL

## 2020-01-28 LAB — TSH: TSH: 0.84 mIU/L (ref 0.40–4.50)

## 2020-02-15 ENCOUNTER — Ambulatory Visit: Payer: Medicare Other | Admitting: Cardiology

## 2020-02-15 ENCOUNTER — Encounter: Payer: Self-pay | Admitting: Cardiology

## 2020-02-15 ENCOUNTER — Other Ambulatory Visit: Payer: Self-pay

## 2020-02-15 VITALS — BP 120/60 | HR 76 | Temp 97.3°F | Ht 64.0 in | Wt 96.8 lb

## 2020-02-15 DIAGNOSIS — Z853 Personal history of malignant neoplasm of breast: Secondary | ICD-10-CM

## 2020-02-15 DIAGNOSIS — J449 Chronic obstructive pulmonary disease, unspecified: Secondary | ICD-10-CM | POA: Diagnosis not present

## 2020-02-15 DIAGNOSIS — Z7189 Other specified counseling: Secondary | ICD-10-CM | POA: Diagnosis not present

## 2020-02-15 DIAGNOSIS — R0600 Dyspnea, unspecified: Secondary | ICD-10-CM

## 2020-02-15 DIAGNOSIS — I340 Nonrheumatic mitral (valve) insufficiency: Secondary | ICD-10-CM

## 2020-02-15 DIAGNOSIS — R0609 Other forms of dyspnea: Secondary | ICD-10-CM

## 2020-02-15 NOTE — Progress Notes (Signed)
Cardiology Office Note:    Date:  02/15/2020   ID:  Helen Davis, DOB [birth year], MRN [id number]  PCP:  Vicie Mutters, PA-C  Cardiologist:  [name], MD  Referring MD: Vicie Mutters, PA-C   CC: new patient evaluation for severe mitral regurgitation  History of Present Illness:    Helen Davis is a 79 y.o. female with a hx of breast cancer who is seen as a new consult at the request of Vicie Mutters, PA-C for the evaluation and management of severe mitral regurgitation.  Note from 8.2.21 with Vicie Mutters reviewed. In physical exam, reported to have 4/6 systolic murmur. Echo done 01/06/20 noted severe mitral regurgitation. Referred to cardiology for further evaluation.  Today: Reports that murmur has been present for a long time, but has gotten louder. Moved from [place] in 2002; thinks she was first told about her murmur around 1994. Her sister and her sister's son both have had mitral valve repair.  Had almost two years of chemotherapy, bilateral mastectomy previously. Told that her chemo was an intense regimen  She has COPD and long term shortness of breath. She does not feel limited, walks the park every day >2 miles. No weight gain, worsening LE edema, PND or orthopnea. No limitations with household activities. Has mild shortness of breath chronically with climbing the stairs. Has a ventolin inhaler but does not require. Has long term chronic mild LE edema, improves with elevation and walking. No recent changes with this. Used to wear compression stocking when on her feet.   We reviewed mitral regurgitation, including using a model/images. We discussed the indications for mitral valve repair/replacement. We discussed the utility and timing of TEE.  Denies chest pain. No PND, orthopnea, or unexpected weight gain. No syncope or palpitations.  Past Medical History:  Diagnosis Date  . ABNORMAL EKG   . Anemia   . Breast cancer ([place])   . Fatigue   .  Mitral valve disorders(424.0)   . Vitamin D deficiency     Past Surgical History:  Procedure Laterality Date  . APPENDECTOMY  1966  . MASTECTOMY    . VARICOSE VEIN SURGERY Right 1975    Current Medications: Current Outpatient Medications on File Prior to Visit  Medication Sig  . Ascorbic Acid (VITAMIN C) 1000 MG tablet Take 500 mg by mouth daily.   . Cholecalciferol (VITAMIN D PO) Take 2,000 Int'l Units by mouth daily.  . Cyanocobalamin (VITAMIN B 12 PO) Take 500 mcg by mouth daily.   Marland Kitchen MAGNESIUM PO Take 500 mg by mouth daily.   . Multiple Vitamins-Minerals (CENTRUM SILVER PO) Take by mouth.  . Zinc 30 MG TABS Take by mouth daily.   No current facility-administered medications on file prior to visit.     Allergies:   Covid-19 mrna vacc (moderna)   Social History   Tobacco Use  . Smoking status: Former Smoker    Quit date: 05/21/1972    Years since quitting: 47.7  . Smokeless tobacco: Never Used  Substance Use Topics  . Alcohol use: Yes    Alcohol/week: 3.0 standard drinks    Types: 3 Glasses of wine per week  . Drug use: No    Family History: family history includes Atrial fibrillation in her mother; Cancer in an other family member; Dementia in her father; Emphysema in her father; Heart disease in her sister; Hypertension in an other family member; Stroke in her mother and another family member.  ROS:   Please see the  history of present illness.  Additional pertinent ROS: Constitutional: Negative for chills, fever, night sweats, unintentional weight loss  HENT: Negative for ear pain and hearing loss.   Eyes: Negative for loss of vision and eye pain.  Respiratory: Negative for cough, sputum, wheezing.   Cardiovascular: See HPI. Gastrointestinal: Negative for abdominal pain, melena, and hematochezia.  Genitourinary: Negative for dysuria and hematuria.  Musculoskeletal: Negative for falls and myalgias.  Skin: Negative for itching and rash.  Neurological: Negative for  focal weakness, focal sensory changes and loss of consciousness.  Endo/Heme/Allergies: Does not bruise/bleed easily.     EKGs/Labs/Other Studies Reviewed:    The following studies were reviewed today: Echo 01/06/20  1. Left ventricular ejection fraction, by estimation, is 60 to 65%. The  left ventricle has normal function. The left ventricle has no regional  wall motion abnormalities. Left ventricular diastolic parameters are  consistent with Grade II diastolic  dysfunction (pseudonormalization).  2. Right ventricular systolic function is normal. The right ventricular  size is normal. There is normal pulmonary artery systolic pressure.  3. Left atrial size was severely dilated.  4. Likely severe mitral regurgitation with multiple jets. Coanda effect  noted on the interatrial septum. No pumonary vein reverse flow noted,  though only one PV is sampled. PISA was not obtained. Consider TEE for  further evaluation if clinically  indicated. The mitral valve is normal in structure. Severe mitral valve  regurgitation. No evidence of mitral stenosis.  5. The aortic valve is tricuspid. Aortic valve regurgitation is mild. No  aortic stenosis is present.  6. The inferior vena cava is normal in size with greater than 50%  respiratory variability, suggesting right atrial pressure of 3 mmHg.   EKG:  EKG is personally reviewed.  The ekg ordered today demonstrates sinus rhythm with PACs, LVH  Recent Labs: 01/27/2020: ALT 16; BUN 12; Creat 0.73; Hemoglobin 11.6; Magnesium 2.0; Platelets 211; Potassium 4.3; Sodium 131; TSH 0.84  Recent Lipid Panel    Component Value Date/Time   CHOL 211 (H) 01/27/2020 1452   TRIG 102 01/27/2020 1452   HDL 107 01/27/2020 1452   CHOLHDL 2.0 01/27/2020 1452   VLDL 15 12/17/2016 1432   LDLCALC 84 01/27/2020 1452    Physical Exam:    VS:  BP 120/60   Pulse 76   Temp (!) 97.3 F (36.3 C)   Ht 5\' 4"  (1.626 m)   Wt 96 lb 12.8 oz (43.9 kg)   LMP  (LMP  Unknown)   SpO2 99%   BMI 16.62 kg/m     Wt Readings from Last 3 Encounters:  02/15/20 96 lb 12.8 oz (43.9 kg)  01/27/20 96 lb 12.8 oz (43.9 kg)  12/21/19 95 lb 9.6 oz (43.4 kg)    GEN: Well nourished, well developed in no acute distress HEENT: Normal, moist mucous membranes NECK: No JVD CARDIAC: regular rhythm, normal S1 and S2, no rubs or gallops. Loud 4/6 machinelike HSM most prominent at the apex. VASCULAR: Radial and DP pulses 2+ bilaterally. No carotid bruits RESPIRATORY:  Clear to auscultation without rales, wheezing or rhonchi  ABDOMEN: Soft, non-tender, non-distended MUSCULOSKELETAL:  Ambulates independently SKIN: Warm and dry, no edema. Varicose veins present NEUROLOGIC:  Alert and oriented x 3. No focal neuro deficits noted. PSYCHIATRIC:  Normal affect    ASSESSMENT:    1. Severe mitral regurgitation   2. History of breast cancer   3. Chronic obstructive pulmonary disease, unspecified COPD type ([place])   4. Cardiac risk counseling  5. Counseling on health promotion and disease prevention   6. Dyspnea on exertion    PLAN:    Severe mitral regurgitation: -loud murmur, but largely asymptomatic (see below re: shortness of breath) -discussed indications for surgery. Would recommend not performing TEE until surgery/intervention is being considered as it otherwise would not change management -discussed symptoms to watch for -she will contact me if her symptoms change but agrees with plan for monitoring  Dyspnea on exertion: -chronic, she feels more related to her COPD. Monitor  History of breast cancer s/p bilateral mastectomy and chemo -unclear to me type of chemo, but she was told it was intense -monitor for cardiomyopathy symptoms  Cardiac risk counseling and prevention recommendations: -recommend heart healthy/Mediterranean diet, with whole grains, fruits, vegetable, fish, lean meats, nuts, and olive oil. Limit salt. -recommend moderate walking, 3-5 times/week  for 30-50 minutes each session. Aim for at least 150 minutes.week. Goal should be pace of 3 miles/hours, or walking 1.5 miles in 30 minutes -recommend avoidance of tobacco products. Avoid excess alcohol.  -ASCVD risk score: The ASCVD Risk score Mikey Bussing DC Jr., et al., 2013) failed to calculate for the following reasons:   The valid HDL cholesterol range is 20 to 100 mg/dL    Plan for follow up: follow up echo in 1 year, with appt scheduled after echo complete  [name], MD, PhD [place]  [facility]    Medication Adjustments/Labs and Tests Ordered: Current medicines are reviewed at length with the patient today.  Concerns regarding medicines are outlined above.  Orders Placed This Encounter  Procedures  . EKG 12-Lead  . ECHOCARDIOGRAM COMPLETE   No orders of the defined types were placed in this encounter.   Patient Instructions  Medication Instructions:  Your Physician recommend you continue on your current medication as directed.    *If you need a refill on your cardiac medications before your next appointment, please call your pharmacy*   Lab Work: None ordered  Testing/Procedures: Your physician has requested that you have an echocardiogram in 1 year. Echocardiography is a painless test that uses sound waves to create images of your heart. It provides your doctor with information about the size and shape of your heart and how well your heart's chambers and valves are working. This procedure takes approximately one hour. There are no restrictions for this procedure. [place] [address]    Follow-Up: At [facility], you and your health needs are our priority.  As part of our continuing mission to provide you with exceptional heart care, we have created designated Provider Care Teams.  These Care Teams include your primary Cardiologist (physician) and Advanced Practice Providers (APPs -  Physician Assistants and Nurse Practitioners) who all work  together to provide you with the care you need, when you need it.  We recommend signing up for the patient portal called "MyChart".  Sign up information is provided on this After Visit Summary.  MyChart is used to connect with patients for Virtual Visits (Telemedicine).  Patients are able to view lab/test results, encounter notes, upcoming appointments, etc.  Non-urgent messages can be sent to your provider as well.   To learn more about what you can do with MyChart, go to [email or web address].    Your next appointment:   1 year(s)  The format for your next appointment:   In Person  Provider:   [name], MD       Signed, [name], MD PhD 02/15/2020 6:41 PM  [facility]

## 2020-02-15 NOTE — Patient Instructions (Signed)
Medication Instructions:  Your Physician recommend you continue on your current medication as directed.    *If you need a refill on your cardiac medications before your next appointment, please call your pharmacy*   Lab Work: None ordered  Testing/Procedures: Your physician has requested that you have an echocardiogram in 1 year. Echocardiography is a painless test that uses sound waves to create images of your heart. It provides your doctor with information about the size and shape of your heart and how well your heart's chambers and valves are working. This procedure takes approximately one hour. There are no restrictions for this procedure. [place] [address]    Follow-Up: At [facility], you and your health needs are our priority.  As part of our continuing mission to provide you with exceptional heart care, we have created designated Provider Care Teams.  These Care Teams include your primary Cardiologist (physician) and Advanced Practice Providers (APPs -  Physician Assistants and Nurse Practitioners) who all work together to provide you with the care you need, when you need it.  We recommend signing up for the patient portal called "MyChart".  Sign up information is provided on this After Visit Summary.  MyChart is used to connect with patients for Virtual Visits (Telemedicine).  Patients are able to view lab/test results, encounter notes, upcoming appointments, etc.  Non-urgent messages can be sent to your provider as well.   To learn more about what you can do with MyChart, go to [email or web address].    Your next appointment:   1 year(s)  The format for your next appointment:   In Person  Provider:   [name], MD

## 2020-02-16 ENCOUNTER — Encounter: Payer: Medicare Other | Admitting: Physician Assistant

## 2020-04-05 ENCOUNTER — Encounter: Payer: Self-pay | Admitting: Cardiology

## 2020-04-05 NOTE — Telephone Encounter (Signed)
Error

## 2020-04-08 ENCOUNTER — Encounter: Payer: Self-pay | Admitting: Cardiology

## 2020-04-08 ENCOUNTER — Ambulatory Visit: Payer: Medicare Other | Admitting: Cardiology

## 2020-04-08 VITALS — BP 114/60 | HR 64 | Ht 64.5 in | Wt 98.6 lb

## 2020-04-08 DIAGNOSIS — R002 Palpitations: Secondary | ICD-10-CM | POA: Diagnosis not present

## 2020-04-08 DIAGNOSIS — Z7189 Other specified counseling: Secondary | ICD-10-CM

## 2020-04-08 DIAGNOSIS — R0600 Dyspnea, unspecified: Secondary | ICD-10-CM | POA: Diagnosis not present

## 2020-04-08 DIAGNOSIS — I340 Nonrheumatic mitral (valve) insufficiency: Secondary | ICD-10-CM

## 2020-04-08 DIAGNOSIS — R0609 Other forms of dyspnea: Secondary | ICD-10-CM

## 2020-04-08 NOTE — Progress Notes (Signed)
Cardiology Office Note:    Date:  04/08/2020   ID:  MIRREN GEST, DOB [birth year], MRN [id number]  PCP:  Vladimir Crofts, PA-C  Cardiologist:  [name], MD  Referring MD: Vladimir Crofts, PA-C   CC: follow up  History of Present Illness:    Helen Davis is a 79 y.o. female with a hx of breast cancer who is seen for follow up today. I initially met her 02/15/20 as a new consult at the request of Vladimir Crofts, PA-C for the evaluation and management of severe mitral regurgitation.  Today: Had an episode 11/14 that she is concerned about. Had a small dish of chocolate fudge ice cream just before bed. Went to bed, heart was pounding and she was shaking for about a hour and a half. Felt cold/clammy. Tried drinking water, took 2 aspirin, used an extra blanket. Went to sleep. Noted still somewhat jittery the next day, took several days to get back to baseline. Has never had an episode like that before.  We discussed options for monitoring. After shared decision making, elected to watch for further symptoms and then would send a monitor.  I would be concerned about afib given her MR. Prior PACs on ECG.  Denies chest pain, unchanged shortness of breath at rest or with normal exertion. No PND, orthopnea, LE edema or unexpected weight gain. No syncope.  Past Medical History:  Diagnosis Date  . ABNORMAL EKG   . Anemia   . Breast cancer ([place])   . Fatigue   . Mitral valve disorders(424.0)   . Vitamin D deficiency     Past Surgical History:  Procedure Laterality Date  . APPENDECTOMY  1966  . MASTECTOMY    . VARICOSE VEIN SURGERY Right 1975    Current Medications: Current Outpatient Medications on File Prior to Visit  Medication Sig  . Ascorbic Acid (VITAMIN C) 1000 MG tablet Take 500 mg by mouth daily.   . Cholecalciferol (VITAMIN D PO) Take 2,000 Int'l Units by mouth daily.  . Cyanocobalamin (VITAMIN B 12 PO) Take 500 mcg by mouth daily.   Marland Kitchen MAGNESIUM  PO Take 500 mg by mouth daily.   . Multiple Vitamins-Minerals (CENTRUM SILVER PO) Take by mouth.  . Zinc 30 MG TABS Take by mouth daily.   No current facility-administered medications on file prior to visit.     Allergies:   Covid-19 mrna vacc (moderna)   Social History   Tobacco Use  . Smoking status: Former Smoker    Quit date: 05/21/1972    Years since quitting: 47.9  . Smokeless tobacco: Never Used  Substance Use Topics  . Alcohol use: Yes    Alcohol/week: 3.0 standard drinks    Types: 3 Glasses of wine per week  . Drug use: No    Family History: family history includes Atrial fibrillation in her mother; Cancer in an other family member; Dementia in her father; Emphysema in her father; Heart disease in her sister; Hypertension in an other family member; Stroke in her mother and another family member.  ROS:   Please see the history of present illness.  Additional pertinent ROS otherwise unremarkable.  EKGs/Labs/Other Studies Reviewed:    The following studies were reviewed today: Echo 01/06/20  1. Left ventricular ejection fraction, by estimation, is 60 to 65%. The  left ventricle has normal function. The left ventricle has no regional  wall motion abnormalities. Left ventricular diastolic parameters are  consistent with Grade II diastolic  dysfunction (pseudonormalization).  2. Right ventricular systolic function is normal. The right ventricular  size is normal. There is normal pulmonary artery systolic pressure.  3. Left atrial size was severely dilated.  4. Likely severe mitral regurgitation with multiple jets. Coanda effect  noted on the interatrial septum. No pumonary vein reverse flow noted,  though only one PV is sampled. PISA was not obtained. Consider TEE for  further evaluation if clinically  indicated. The mitral valve is normal in structure. Severe mitral valve  regurgitation. No evidence of mitral stenosis.  5. The aortic valve is tricuspid. Aortic  valve regurgitation is mild. No  aortic stenosis is present.  6. The inferior vena cava is normal in size with greater than 50%  respiratory variability, suggesting right atrial pressure of 3 mmHg.   EKG:  EKG is personally reviewed.  The ekg ordered 02/15/20 demonstrates sinus rhythm with PACs, LVH  Recent Labs: 01/27/2020: ALT 16; BUN 12; Creat 0.73; Hemoglobin 11.6; Magnesium 2.0; Platelets 211; Potassium 4.3; Sodium 131; TSH 0.84  Recent Lipid Panel    Component Value Date/Time   CHOL 211 (H) 01/27/2020 1452   TRIG 102 01/27/2020 1452   HDL 107 01/27/2020 1452   CHOLHDL 2.0 01/27/2020 1452   VLDL 15 12/17/2016 1432   LDLCALC 84 01/27/2020 1452    Physical Exam:    VS:  BP 114/60   Pulse 64   Ht 5' 4.5" (1.638 m)   Wt 98 lb 9.6 oz (44.7 kg)   LMP  (LMP Unknown)   SpO2 99%   BMI 16.66 kg/m     Wt Readings from Last 3 Encounters:  04/08/20 98 lb 9.6 oz (44.7 kg)  02/15/20 96 lb 12.8 oz (43.9 kg)  01/27/20 96 lb 12.8 oz (43.9 kg)   GEN: Well nourished, well developed in no acute distress HEENT: Normal, moist mucous membranes NECK: No JVD CARDIAC: regular rhythm, normal S1 and S2, no rubs or gallops. Loud 4/6 machinelike HSM most prominent at the apex. VASCULAR: Radial and DP pulses 2+ bilaterally. No carotid bruits RESPIRATORY:  Clear to auscultation without rales, wheezing or rhonchi  ABDOMEN: Soft, non-tender, non-distended MUSCULOSKELETAL:  Ambulates independently SKIN: Warm and dry, no edema NEUROLOGIC:  Alert and oriented x 3. No focal neuro deficits noted. PSYCHIATRIC:  Normal affect   ASSESSMENT:    1. Heart palpitations   2. Severe mitral regurgitation   3. Dyspnea on exertion   4. Counseling on health promotion and disease prevention   5. Cardiac risk counseling    PLAN:    Palpitations: strange single event -we discussed monitor today, but she would like to hold on this for now -my concern would be for atrial fibrillation given severe MR -regular  on exam today  Severe mitral regurgitation: -loud murmur, but largely asymptomatic (see below re: shortness of breath) -see prior discussion. We have discussed surgery and TEE at initial visit; after shared decision making, will monitor for now but will follow annually with echo and she will contact me if she develops worsening symptoms  Dyspnea on exertion: -chronic, she feels more related to her COPD. Monitor  History of breast cancer s/p bilateral mastectomy and chemo -unclear to me type of chemo, but she was told it was intense -monitor for cardiomyopathy symptoms  Cardiac risk counseling and prevention recommendations: -recommend heart healthy/Mediterranean diet, with whole grains, fruits, vegetable, fish, lean meats, nuts, and olive oil. Limit salt. -recommend moderate walking, 3-5 times/week for 30-50 minutes each session. Aim for  at least 150 minutes.week. Goal should be pace of 3 miles/hours, or walking 1.5 miles in 30 minutes -recommend avoidance of tobacco products. Avoid excess alcohol.  -ASCVD risk score: The ASCVD Risk score Mikey Bussing DC Jr., et al., 2013) failed to calculate for the following reasons:   The valid HDL cholesterol range is 20 to 100 mg/dL    Plan for follow up: about 9 mos, with echo prior  [name], MD, PhD [place]  [facility]    Medication Adjustments/Labs and Tests Ordered: Current medicines are reviewed at length with the patient today.  Concerns regarding medicines are outlined above.  No orders of the defined types were placed in this encounter.  No orders of the defined types were placed in this encounter.   Patient Instructions  Medication Instructions:  Your Physician recommend you continue on your current medication as directed.    *If you need a refill on your cardiac medications before your next appointment, please call your pharmacy*   Lab Work: None   Testing/Procedures: None   Follow-Up: At [facility],  you and your health needs are our priority.  As part of our continuing mission to provide you with exceptional heart care, we have created designated Provider Care Teams.  These Care Teams include your primary Cardiologist (physician) and Advanced Practice Providers (APPs -  Physician Assistants and Nurse Practitioners) who all work together to provide you with the care you need, when you need it.  We recommend signing up for the patient portal called "MyChart".  Sign up information is provided on this After Visit Summary.  MyChart is used to connect with patients for Virtual Visits (Telemedicine).  Patients are able to view lab/test results, encounter notes, upcoming appointments, etc.  Non-urgent messages can be sent to your provider as well.   To learn more about what you can do with MyChart, go to [email or web address].    Your next appointment:   9 month(s)  The format for your next appointment:   In Person  Provider:   [name], MD       Signed, [name], MD PhD 04/08/2020  [place]

## 2020-04-08 NOTE — Patient Instructions (Signed)
Medication Instructions:  Your Physician recommend you continue on your current medication as directed.    *If you need a refill on your cardiac medications before your next appointment, please call your pharmacy*   Lab Work: None   Testing/Procedures: None   Follow-Up: At [facility], you and your health needs are our priority.  As part of our continuing mission to provide you with exceptional heart care, we have created designated Provider Care Teams.  These Care Teams include your primary Cardiologist (physician) and Advanced Practice Providers (APPs -  Physician Assistants and Nurse Practitioners) who all work together to provide you with the care you need, when you need it.  We recommend signing up for the patient portal called "MyChart".  Sign up information is provided on this After Visit Summary.  MyChart is used to connect with patients for Virtual Visits (Telemedicine).  Patients are able to view lab/test results, encounter notes, upcoming appointments, etc.  Non-urgent messages can be sent to your provider as well.   To learn more about what you can do with MyChart, go to [email or web address].    Your next appointment:   9 month(s)  The format for your next appointment:   In Person  Provider:   Bridgette Christopher, MD     

## 2020-07-31 ENCOUNTER — Encounter: Payer: Self-pay | Admitting: Cardiology

## 2020-08-03 ENCOUNTER — Encounter: Payer: Self-pay | Admitting: Internal Medicine

## 2020-08-03 DIAGNOSIS — R030 Elevated blood-pressure reading, without diagnosis of hypertension: Secondary | ICD-10-CM | POA: Insufficient documentation

## 2020-08-03 DIAGNOSIS — R7309 Other abnormal glucose: Secondary | ICD-10-CM | POA: Insufficient documentation

## 2020-08-03 NOTE — Progress Notes (Unsigned)
History of Present Illness:       This very nice 80 y.o. WWF presents for 6 month follow up with labile elevated BP,  Hx/o Mitral Insufficiency, HLD,  Hx/o COPD and Vitamin D Deficiency.        Patient is monitored expectantly for elevated BP & BP has been controlled at home. Today's BP is at goal - 106/72.  Patient has hx/o severe Mitral Insufficiency & is also followed by Cardiology - Dr [name]. Patient has had no complaints of any cardiac type chest pain, palpitations, dyspnea.       Hyperlipidemia is controlled with diet with a high HDL & low LDL . Patient denies myalgias or other med SE's. Last Lipids were  Lab Results  Component Value Date   CHOL 211 (H) 01/27/2020   HDL 107 01/27/2020   LDLCALC 84 01/27/2020   TRIG 102 01/27/2020   CHOLHDL 2.0 01/27/2020    Also, the patient is monitored proactively for abnormal glucose intolerance and has had no symptoms of reactive hypoglycemia, diabetic polys, paresthesias or visual blurring.  Last A1c was normal & at goal:  Lab Results  Component Value Date   HGBA1C 4.7 06/11/2016               Further, the patient also has history of Vitamin D Deficiency  ("37"/2010) and supplements vitamin D without any suspected side-effects. Last vitamin D was at goal:  Lab Results  Component Value Date   VD25OH 7 01/27/2020    Current Outpatient Medications on File Prior to Visit  Medication Sig  . VITAMIN C 1000 MG tablet Take 500 mg daily.   Marland Kitchen VITAMIN D 2,000 Units Take  2  times daily.  Marland Kitchen VITAMIN B 12  500 mcg  Takedaily.   Marland Kitchen MAGNESIUM  500 mg  Take daily.   Marland Kitchen ENTRUM SILVER Take   . Zinc 30 MG TABS Take daily.    Allergies  Allergen Reactions  . Covid-19 Mrna Vacc (Moderna) Rash    Patient states 1 week after moderna had severe blistering rash from left shoulder down to left elbow, will not get second shot    PMHx:   Past Medical History:  Diagnosis Date  . ABNORMAL EKG   . Anemia   . Breast cancer  ([place])   . Fatigue   . Mitral valve disorders(424.0)   . Vitamin D deficiency     Immunization History  Administered Date(s) Administered  . Pneumococcal Conjugate-13 02/12/2018  . Pneumococcal Polysaccharide-23 02/16/2019  . Tdap 09/18/2009    Past Surgical History:  Procedure Laterality Date  . APPENDECTOMY  1966  . MASTECTOMY    . VARICOSE VEIN SURGERY Right 1975    FHx:    Reviewed / unchanged  SHx:    Reviewed / unchanged   Systems Review:  Constitutional: Denies fever, chills, wt changes, headaches, insomnia, fatigue, night sweats, change in appetite. Eyes: Denies redness, blurred vision, diplopia, discharge, itchy, watery eyes.  ENT: Denies discharge, congestion, post nasal drip, epistaxis, sore throat, earache, hearing loss, dental pain, tinnitus, vertigo, sinus pain, snoring.  CV: Denies chest pain, palpitations, irregular heartbeat, syncope, dyspnea, diaphoresis, orthopnea, PND, claudication or edema. Respiratory: denies cough, dyspnea, DOE, pleurisy, hoarseness, laryngitis, wheezing.  Gastrointestinal: Denies dysphagia, odynophagia, heartburn, reflux, water brash, abdominal pain or cramps, nausea, vomiting, bloating, diarrhea, constipation, hematemesis, melena, hematochezia  or hemorrhoids. Genitourinary: Denies dysuria, frequency, urgency, nocturia, hesitancy, discharge, hematuria or flank pain. Musculoskeletal: Denies arthralgias,  myalgias, stiffness, jt. swelling, pain, limping or strain/sprain.  Skin: Denies pruritus, rash, hives, warts, acne, eczema or change in skin lesion(s). Neuro: No weakness, tremor, incoordination, spasms, paresthesia or pain. Psychiatric: Denies confusion, memory loss or sensory loss. Endo: Denies change in weight, skin or hair change.  Heme/Lymph: No excessive bleeding, bruising or enlarged lymph nodes.  Physical Exam  BP 106/72   Pulse 63   Temp (!) 97.3 F (36.3 C)   Resp 16   Ht 5' 4.5" (1.638 m)   Wt 99 lb (44.9 kg)   LMP   (LMP Unknown)   SpO2 98%   BMI 16.73 kg/m   Appears  well nourished, well groomed  and in no distress.  Eyes: PERRLA, EOMs, conjunctiva no swelling or erythema. Sinuses: No frontal/maxillary tenderness ENT/Mouth: EAC's clear, TM's nl w/o erythema, bulging. Nares clear w/o erythema, swelling, exudates. Oropharynx clear without erythema or exudates. Oral hygiene is good. Tongue normal, non obstructing. Hearing intact.  Neck: Supple. Thyroid not palpable. Car 2+/2+ without bruits, nodes or JVD. Chest: Respirations nl with BS clear & equal w/o rales, rhonchi, wheezing or stridor.  Cor: Heart sounds normal w/ regular rate and rhythm with gr 3-4 sys apical murmur. Peripheral pulses normal and equal  without edema.  Abdomen: Soft & bowel sounds normal. Non-tender w/o guarding, rebound, hernias, masses or organomegaly.  Lymphatics: Unremarkable.  Musculoskeletal: Full ROM all peripheral extremities, joint stability, 5/5 strength and normal gait.  Skin: Warm, dry without exposed rashes, lesions or ecchymosis apparent.  Neuro: Cranial nerves intact, reflexes equal bilaterally. Sensory-motor testing grossly intact. Tendon reflexes grossly intact.  Pysch: Alert & oriented x 3.  Insight and judgement nl & appropriate. No ideations.  Assessment and Plan:  1. Elevated BP without diagnosis of hypertension  - Continue medication, monitor blood pressure at home.  - Continue DASH diet.  Reminder to go to the ER if any CP,  SOB, nausea, dizziness, severe HA, changes vision/speech.  - CBC with Differential/Platelet - COMPLETE METABOLIC PANEL WITH GFR - Magnesium - TSH  2. Mixed hyperlipidemia  - Continue diet/meds, exercise,& lifestyle modifications.  - Continue monitor periodic cholesterol/liver & renal functions   - Lipid panel - TSH  3. Abnormal glucose  - Continue diet, exercise  - Lifestyle modifications.  - Monitor appropriate labs  - Hemoglobin A1c  4. Vitamin D deficiency  -  Continue supplementation.  - VITAMIN D 25 Hydroxy   5. Severe mitral regurgitation   6. Medication management  - CBC with Differential/Platelet - COMPLETE METABOLIC PANEL WITH GFR - Magnesium - Lipid panel - TSH - Hemoglobin A1c - VITAMIN D 25 Hydroxyl       Discussed  regular exercise, BP monitoring, weight control to achieve/maintain BMI less than 25 and discussed med and SE's. Recommended labs to assess and monitor clinical status with further disposition pending results of labs.  I discussed the assessment and treatment plan with the patient. The patient was provided an opportunity to ask questions and all were answered. The patient agreed with the plan and demonstrated an understanding of the instructions.  I provided over 30 minutes of exam, counseling, chart review and  complex critical decision making.       The patient was advised to call back or seek an in-person evaluation if the symptoms worsen or if the condition fails to improve as anticipated.   Kirtland Bouchard, MD

## 2020-08-03 NOTE — Patient Instructions (Signed)

## 2020-08-04 ENCOUNTER — Encounter: Payer: Self-pay | Admitting: Internal Medicine

## 2020-08-04 ENCOUNTER — Other Ambulatory Visit: Payer: Self-pay

## 2020-08-04 ENCOUNTER — Ambulatory Visit (INDEPENDENT_AMBULATORY_CARE_PROVIDER_SITE_OTHER): Payer: Medicare Other | Admitting: Internal Medicine

## 2020-08-04 VITALS — BP 106/72 | HR 63 | Temp 97.3°F | Resp 16 | Ht 64.5 in | Wt 99.0 lb

## 2020-08-04 DIAGNOSIS — E559 Vitamin D deficiency, unspecified: Secondary | ICD-10-CM | POA: Diagnosis not present

## 2020-08-04 DIAGNOSIS — R7309 Other abnormal glucose: Secondary | ICD-10-CM

## 2020-08-04 DIAGNOSIS — R030 Elevated blood-pressure reading, without diagnosis of hypertension: Secondary | ICD-10-CM

## 2020-08-04 DIAGNOSIS — E782 Mixed hyperlipidemia: Secondary | ICD-10-CM

## 2020-08-04 DIAGNOSIS — I340 Nonrheumatic mitral (valve) insufficiency: Secondary | ICD-10-CM

## 2020-08-04 DIAGNOSIS — Z79899 Other long term (current) drug therapy: Secondary | ICD-10-CM

## 2020-08-05 LAB — CBC WITH DIFFERENTIAL/PLATELET
Absolute Monocytes: 681 cells/uL (ref 200–950)
Basophils Absolute: 78 cells/uL (ref 0–200)
Basophils Relative: 1.5 %
Eosinophils Absolute: 52 cells/uL (ref 15–500)
Eosinophils Relative: 1 %
HCT: 34.9 % — ABNORMAL LOW (ref 35.0–45.0)
Hemoglobin: 11.8 g/dL (ref 11.7–15.5)
Lymphs Abs: 1222 cells/uL (ref 850–3900)
MCH: 32.3 pg (ref 27.0–33.0)
MCHC: 33.8 g/dL (ref 32.0–36.0)
MCV: 95.6 fL (ref 80.0–100.0)
MPV: 11.7 fL (ref 7.5–12.5)
Monocytes Relative: 13.1 %
Neutro Abs: 3167 cells/uL (ref 1500–7800)
Neutrophils Relative %: 60.9 %
Platelets: 208 10*3/uL (ref 140–400)
RBC: 3.65 10*6/uL — ABNORMAL LOW (ref 3.80–5.10)
RDW: 11.6 % (ref 11.0–15.0)
Total Lymphocyte: 23.5 %
WBC: 5.2 10*3/uL (ref 3.8–10.8)

## 2020-08-05 LAB — COMPLETE METABOLIC PANEL WITH GFR
AG Ratio: 1.9 (calc) (ref 1.0–2.5)
ALT: 17 U/L (ref 6–29)
AST: 20 U/L (ref 10–35)
Albumin: 4.1 g/dL (ref 3.6–5.1)
Alkaline phosphatase (APISO): 68 U/L (ref 37–153)
BUN: 10 mg/dL (ref 7–25)
CO2: 29 mmol/L (ref 20–32)
Calcium: 9.3 mg/dL (ref 8.6–10.4)
Chloride: 96 mmol/L — ABNORMAL LOW (ref 98–110)
Creat: 0.66 mg/dL (ref 0.60–0.93)
GFR, Est African American: 99 mL/min/{1.73_m2} (ref >=60)
GFR, Est Non African American: 85 mL/min/{1.73_m2} (ref >=60)
Globulin: 2.2 g/dL (calc) (ref 1.9–3.7)
Glucose, Bld: 98 mg/dL (ref 65–99)
Potassium: 4.5 mmol/L (ref 3.5–5.3)
Sodium: 130 mmol/L — ABNORMAL LOW (ref 135–146)
Total Bilirubin: 0.8 mg/dL (ref 0.2–1.2)
Total Protein: 6.3 g/dL (ref 6.1–8.1)

## 2020-08-05 LAB — HEMOGLOBIN A1C
Hgb A1c MFr Bld: 4.8 % of total Hgb (ref <5.7)
Mean Plasma Glucose: 91 mg/dL
eAG (mmol/L): 5 mmol/L

## 2020-08-05 LAB — MAGNESIUM: Magnesium: 2 mg/dL (ref 1.5–2.5)

## 2020-08-05 LAB — LIPID PANEL
Cholesterol: 222 mg/dL — ABNORMAL HIGH (ref <200)
HDL: 113 mg/dL (ref >=50)
LDL Cholesterol (Calc): 86 mg/dL (calc)
Non-HDL Cholesterol (Calc): 109 mg/dL (calc) (ref <130)
Total CHOL/HDL Ratio: 2 (calc) (ref <5.0)
Triglycerides: 124 mg/dL (ref <150)

## 2020-08-05 LAB — TSH: TSH: 0.79 mIU/L (ref 0.40–4.50)

## 2020-08-05 LAB — VITAMIN D 25 HYDROXY (VIT D DEFICIENCY, FRACTURES): Vit D, 25-Hydroxy: 75 ng/mL (ref 30–100)

## 2020-08-06 NOTE — Progress Notes (Signed)
============================================================ ============================================================  -    Total Chol is elevated , but OK since your                                                        Good /Protective HDL Chol is "Super high"   - and   - Your bad LDL Chol = 86 is very Low,  So  -- >>   Excellent   - Very low risk for Heart Attack  / Stroke ============================================================ ============================================================  - A1c is low - Great - No Diabetes !  ============================================================ ============================================================  - Vitamin D = 75 - Also,  Excellent ! ============================================================ ============================================================  - All Else - CBC - Kidneys - Electrolytes - Liver - Magnesium & Thyroid    - all  Normal / OK ============================================================ ============================================================  - Keep up the [place] Work  ! ============================================================ ===========================================================

## 2020-09-07 DIAGNOSIS — H26492 Other secondary cataract, left eye: Secondary | ICD-10-CM | POA: Diagnosis not present

## 2020-09-07 DIAGNOSIS — H33321 Round hole, right eye: Secondary | ICD-10-CM | POA: Diagnosis not present

## 2020-09-07 DIAGNOSIS — H04123 Dry eye syndrome of bilateral lacrimal glands: Secondary | ICD-10-CM | POA: Diagnosis not present

## 2021-01-10 ENCOUNTER — Other Ambulatory Visit (HOSPITAL_COMMUNITY): Payer: Medicare Other

## 2021-01-19 ENCOUNTER — Other Ambulatory Visit: Payer: Self-pay

## 2021-01-19 ENCOUNTER — Ambulatory Visit (HOSPITAL_COMMUNITY): Payer: Medicare Other | Attending: Cardiology

## 2021-01-19 DIAGNOSIS — I340 Nonrheumatic mitral (valve) insufficiency: Secondary | ICD-10-CM | POA: Diagnosis not present

## 2021-01-19 LAB — ECHOCARDIOGRAM COMPLETE
AR max vel: 1.44 cm2
AV Area VTI: 1.79 cm2
AV Area mean vel: 1.45 cm2
AV Mean grad: 3 mmHg
AV Peak grad: 6.3 mmHg
Ao pk vel: 1.25 m/s
Area-P 1/2: 2.68 cm2
MV M vel: 6 m/s
MV Peak grad: 144.1 mmHg
MV VTI: 0.91 cm2
P 1/2 time: 703 msec
Radius: 0.9 cm
S' Lateral: 3.5 cm

## 2021-01-25 ENCOUNTER — Telehealth: Payer: Self-pay | Admitting: Cardiology

## 2021-01-25 NOTE — Telephone Encounter (Signed)
Patient returning call for echo results. 

## 2021-01-25 NOTE — Telephone Encounter (Signed)
   Pt is returning call, per Lars Mage, she will cb pt

## 2021-01-25 NOTE — Progress Notes (Signed)
Complete Physical  Assessment and Plan: Helen Davis was seen today for annual exam.  Diagnoses and all orders for this visit:  Encounter for general adult medical examination with abnormal findings  Due Annually  Chronic obstructive pulmonary disease, unspecified COPD type ([place])  continue to avoid triggers, call if any change in symptoms, continue exercise, denies use of inhalers  Elevated BP without diagnosis of hypertension -     CBC with Differential/Platelet - DASH diet, exercise and monitor at home. Call if greater than 130/80.   Mixed hyperlipidemia -     COMPLETE METABOLIC PANEL WITH GFR -     Lipid panel  Continue diet and exercise  Severe mitral regurgitation  Following with Dr Harrell Gave, has appt on 01/30/21 to discuss scheduling TEE, MVR is worsening on echo 01/19/21  Abnormal glucose -     Hemoglobin A1c  Vitamin D deficiency -     VITAMIN D 25 Hydroxy (Vit-D Deficiency, Fractures) - Continue Vit D supplementation  Osteoporosis, unspecified osteoporosis type, unspecified pathological fracture presence DEXA 2011 , declines another, encourage Vit D supplement  Anemia, unspecified type -     CBC with Differential/Platelet -     Vitamin B12 -     Iron, Total/Total Iron Binding Cap -     Ferritin  History of breast cancer  S/p mastectomy no need for MGM  Screening for hematuria or proteinuria -     Urinalysis, Routine w reflex microscopic -     Microalbumin / creatinine urine ratio  Screening for thyroid disorder -     TSH  Medication management -     CBC with Differential/Platelet -     COMPLETE METABOLIC PANEL WITH GFR -     Magnesium -     Lipid panel -     TSH -     Hemoglobin A1c -     VITAMIN D 25 Hydroxy (Vit-D Deficiency, Fractures) -     Urinalysis, Routine w reflex microscopic -     Microalbumin / creatinine urine ratio -     Vitamin B12 -     Iron, Total/Total Iron Binding Cap -     Ferritin   Discussed med's effects and SE's. Screening  labs and tests as requested with regular follow-up as recommended. Over 40 minutes of exam, counseling, chart review, and complex, high level critical decision making was performed this visit.  Future Appointments  Date Time Provider [place]  01/30/2021  2:20 PM [name], MD [facility]  02/09/2021  9:40 AM [name], MD [facility]  01/29/2022  2:00 PM Magda Bernheim, NP [facility] None    HPI  80 y.o. female  presents for a complete physical and follow up for has Severe mitral regurgitation; Anemia; History of breast cancer; Vitamin D deficiency; Mixed hyperlipidemia; Osteoporosis; DJD (degenerative joint disease); Mild malnutrition ([place]); COPD (chronic obstructive pulmonary disease) ([place]); Varicose veins with ulcer, right ([place]); Unilateral primary osteoarthritis, right knee; Abnormal glucose; and Elevated BP without diagnosis of hypertension on their problem list..  Her dog Chippy died before Christmas.  Continues to walk daily.   Her blood pressure has been controlled at home, today their BP is BP: 96/62 BP Readings from Last 3 Encounters:  01/26/21 96/62  08/04/20 106/72  04/08/20 114/60    She does workout. She denies chest pain, shortness of breath, dizziness.   She has had some palpitations, fatigue with her walking lately, had echo that showed severe MVR. Another echo shows worsening of  MVR. She has appointment with cardiologist on Monday to schedule a TEE test.   Sister is in hospice in [place]. She just came back from visiting. +6 She has COPD, could not afford Symbicort, currently not using medication  She is not on cholesterol medication . Her cholesterol is at goal. The cholesterol last visit was:   Lab Results  Component Value Date   CHOL 222 (H) 08/04/2020   HDL 113 08/04/2020   LDLCALC 86 08/04/2020   TRIG 124 08/04/2020   CHOLHDL 2.0 08/04/2020    Last A1C in the office was:  Lab Results  Component Value Date   HGBA1C 4.8  08/04/2020    Last GFR: Lab Results  Component Value Date   GFRNONAA 85 08/04/2020    Patient is on Vitamin D supplement.   Lab Results  Component Value Date   VD25OH 75 08/04/2020      Current Medications:  Current Outpatient Medications on File Prior to Visit  Medication Sig Dispense Refill   Ascorbic Acid (VITAMIN C) 1000 MG tablet Take 500 mg by mouth daily.      Cholecalciferol (VITAMIN D PO) Take 2,000 Int'l Units by mouth 2 (two) times daily.     Cyanocobalamin (VITAMIN B 12 PO) Take 500 mcg by mouth daily. Takes 1000 mcg     MAGNESIUM PO Take 500 mg by mouth daily.      Multiple Vitamins-Minerals (CENTRUM SILVER PO) Take by mouth.     Zinc 30 MG TABS Take by mouth daily.     No current facility-administered medications on file prior to visit.   Allergies:  Allergies  Allergen Reactions   Covid-19 Mrna Vacc (Moderna) Rash    Patient states 1 week after moderna had severe blistering rash from left shoulder down to left elbow, will not get second shot   Medical History:  She has Severe mitral regurgitation; Anemia; History of breast cancer; Vitamin D deficiency; Mixed hyperlipidemia; Osteoporosis; DJD (degenerative joint disease); Mild malnutrition ([place]); COPD (chronic obstructive pulmonary disease) ([place]); Varicose veins with ulcer, right ([place]); Unilateral primary osteoarthritis, right knee; Abnormal glucose; and Elevated BP without diagnosis of hypertension on their problem list. Health Maintenance:   Immunization History  Administered Date(s) Administered   Pneumococcal Conjugate-13 02/12/2018   Pneumococcal Polysaccharide-23 02/16/2019   Tdap 09/18/2009    Tetanus:2011 Pneumovax:2020 Prevnar 13: 2019 Flu vaccine:declines Zostavax: declines LMP: N/A Pap: 2013 declines Another MGM: S/P mastectomy, no need for mammogram DEXA:2013 osteoporosis, Fosamax in past, declines repeat evaluation Colonoscopy: Declines EGD:  Last Dental Exam: Dr. Bobby Rumpf Last Eye  Exam:Dr. Bevis Patient Care Team: Unk Pinto, MD as PCP - General (Internal Medicine) Darleen Crocker, MD as Consulting Physician (Ophthalmology) Richmond Campbell, MD as Consulting Physician (Gastroenterology) Druscilla Brownie, MD as Consulting Physician (Dermatology) Garald Balding, MD as Consulting Physician (Orthopedic Surgery) Unice Bailey, MD as Consulting Physician (Rheumatology) Victorino Dike, DDS as Consulting Physician (Dentistry) Larey Dresser, MD as Consulting Physician (Cardiology)  Surgical History:  She has a past surgical history that includes Mastectomy; Appendectomy (1966); and Varicose vein surgery (Right, 1975). Family History:  Herfamily history includes Atrial fibrillation in her mother; Cancer in an other family member; Dementia in her father; Emphysema in her father; Heart disease in her sister; Hypertension in an other family member; Stroke in her mother and another family member. Social History:  She reports that she quit smoking about 48 years ago. Her smoking use included cigarettes. She has never used smokeless tobacco. She reports current alcohol  use of about 3.0 standard drinks per week. She reports that she does not use drugs.  Review of Systems: Review of Systems  Constitutional:  Negative for chills and fever.  HENT:  Negative for congestion, hearing loss, sinus pain, sore throat and tinnitus.   Eyes:  Negative for blurred vision and double vision.  Respiratory:  Positive for cough (intermittent clear mucus) and shortness of breath (intermittent). Negative for wheezing.   Cardiovascular:  Positive for palpitations and leg swelling (occurs throughout the day). Negative for chest pain and orthopnea.  Gastrointestinal:  Negative for abdominal pain, constipation, diarrhea, heartburn, nausea and vomiting.  Genitourinary:  Negative for dysuria, frequency and urgency.  Musculoskeletal:  Positive for back pain. Joint pain: arthritis worse in  hands. Skin:  Negative for rash.  Neurological:  Negative for dizziness and headaches.  Endo/Heme/Allergies:  Bruises/bleeds easily.  Psychiatric/Behavioral:  Negative for depression.    Physical Exam: Estimated body mass index is 16.43 kg/m as calculated from the following:   Height as of 08/04/20: 5' 4.5" (1.638 m).   Weight as of this encounter: 97 lb 3.2 oz (44.1 kg). BP 96/62   Pulse 71   Temp (!) 97.3 F (36.3 C)   Wt 97 lb 3.2 oz (44.1 kg)   LMP  (LMP Unknown)   SpO2 96%   BMI 16.43 kg/m  General Appearance: Well nourished, in no apparent distress.  Eyes: PERRLA, EOMs, conjunctiva no swelling or erythema, normal fundi and vessels.  Sinuses: No Frontal/maxillary tenderness  ENT/Mouth: Ext aud canals clear, normal light reflex with TMs without erythema, bulging. Good dentition. No erythema, swelling, or exudate on post pharynx. Tonsils not swollen or erythematous. Hearing normal.  Neck: Supple, thyroid normal. No bruits  Respiratory: Respiratory effort normal, BS equal bilaterally without rales, rhonchi, wheezing or stridor.  Cardio:.RRR, normal S1, S2, with 4/6 holosystolic murmur Brisk peripheral pulses without edema.  Chest: symmetric, with normal excursions and percussion.  Breasts: S/P mastectomy Abdomen: Positive bowel sounds all 4 quadrants, Soft, nontender, no guarding, rebound, hernias, masses, or organomegaly.  Lymphatics: Non tender without lymphadenopathy.  Genitourinary:deferred Musculoskeletal: Full ROM all peripheral extremities,5/5 strength, and normal gait.  Extremities: 1+ nonpitting edema of feet, arthritic changes noticeable in hands Skin: Warm, dry without rashes, lesions, ecchymosis. Neuro: Cranial nerves intact, reflexes equal bilaterally. Normal muscle tone, no cerebellar symptoms. Sensation intact.  Psych: Awake and oriented X 3, normal affect, Insight and Judgment appropriate.   EKG: defer cardiology    Diamone Whistler W Yuka Lallier 2:17 PM [facility]

## 2021-01-25 NOTE — Telephone Encounter (Signed)
Pt updated with ECHO results along with MD's recommendations. Pt voiced she would prefer to discuss in person. Appointment scheduled for 9/12.  [name], MD  01/24/2021  8:42 AM EDT     Echo shows normal squeeze of the heart. It is moderately stiff (doesn't relax well), which we manage by avoiding high blood pressure and watching for fluid buildup. The bigger issue is that the mitral valve appears to be severely leaky. I would recommend we look at it with a procedure called a transesophageal echocardiogram (TEE). This will help [place] better analyze the valve and determine if it needs surgery or if we can do a nonsurgical procedure (called MitraClip) to fix it. I would have you do the TEE with myself, Dr. Margaretann Loveless, Dr. Audie Box, Dr. Sallyanne Kuster, or Dr. Johnsie Cancel to take a close look. Happy to discuss more at a visit (virtual or in person) if that would help.

## 2021-01-25 NOTE — Telephone Encounter (Signed)
Left a message for the patient to call back.  

## 2021-01-26 ENCOUNTER — Ambulatory Visit (INDEPENDENT_AMBULATORY_CARE_PROVIDER_SITE_OTHER): Payer: Medicare Other | Admitting: Nurse Practitioner

## 2021-01-26 ENCOUNTER — Encounter: Payer: Self-pay | Admitting: Nurse Practitioner

## 2021-01-26 ENCOUNTER — Other Ambulatory Visit: Payer: Self-pay

## 2021-01-26 VITALS — BP 96/62 | HR 71 | Temp 97.3°F | Wt 97.2 lb

## 2021-01-26 DIAGNOSIS — J449 Chronic obstructive pulmonary disease, unspecified: Secondary | ICD-10-CM

## 2021-01-26 DIAGNOSIS — D649 Anemia, unspecified: Secondary | ICD-10-CM

## 2021-01-26 DIAGNOSIS — Z0000 Encounter for general adult medical examination without abnormal findings: Secondary | ICD-10-CM | POA: Diagnosis not present

## 2021-01-26 DIAGNOSIS — E782 Mixed hyperlipidemia: Secondary | ICD-10-CM

## 2021-01-26 DIAGNOSIS — R7309 Other abnormal glucose: Secondary | ICD-10-CM

## 2021-01-26 DIAGNOSIS — E559 Vitamin D deficiency, unspecified: Secondary | ICD-10-CM | POA: Diagnosis not present

## 2021-01-26 DIAGNOSIS — Z1329 Encounter for screening for other suspected endocrine disorder: Secondary | ICD-10-CM

## 2021-01-26 DIAGNOSIS — Z1389 Encounter for screening for other disorder: Secondary | ICD-10-CM

## 2021-01-26 DIAGNOSIS — I340 Nonrheumatic mitral (valve) insufficiency: Secondary | ICD-10-CM

## 2021-01-26 DIAGNOSIS — R030 Elevated blood-pressure reading, without diagnosis of hypertension: Secondary | ICD-10-CM

## 2021-01-26 DIAGNOSIS — M810 Age-related osteoporosis without current pathological fracture: Secondary | ICD-10-CM

## 2021-01-26 DIAGNOSIS — Z853 Personal history of malignant neoplasm of breast: Secondary | ICD-10-CM

## 2021-01-26 DIAGNOSIS — Z136 Encounter for screening for cardiovascular disorders: Secondary | ICD-10-CM | POA: Insufficient documentation

## 2021-01-26 DIAGNOSIS — Z79899 Other long term (current) drug therapy: Secondary | ICD-10-CM

## 2021-01-26 DIAGNOSIS — Z0001 Encounter for general adult medical examination with abnormal findings: Secondary | ICD-10-CM

## 2021-01-26 NOTE — Patient Instructions (Signed)

## 2021-01-27 LAB — LIPID PANEL
Cholesterol: 222 mg/dL — ABNORMAL HIGH (ref <200)
HDL: 121 mg/dL (ref >=50)
LDL Cholesterol (Calc): 85 mg/dL (calc)
Non-HDL Cholesterol (Calc): 101 mg/dL (calc) (ref <130)
Total CHOL/HDL Ratio: 1.8 (calc) (ref <5.0)
Triglycerides: 74 mg/dL (ref <150)

## 2021-01-27 LAB — COMPLETE METABOLIC PANEL WITH GFR
AG Ratio: 1.7 (calc) (ref 1.0–2.5)
ALT: 17 U/L (ref 6–29)
AST: 20 U/L (ref 10–35)
Albumin: 4 g/dL (ref 3.6–5.1)
Alkaline phosphatase (APISO): 69 U/L (ref 37–153)
BUN: 16 mg/dL (ref 7–25)
CO2: 27 mmol/L (ref 20–32)
Calcium: 9.4 mg/dL (ref 8.6–10.4)
Chloride: 95 mmol/L — ABNORMAL LOW (ref 98–110)
Creat: 0.63 mg/dL (ref 0.60–1.00)
Globulin: 2.3 g/dL (calc) (ref 1.9–3.7)
Glucose, Bld: 80 mg/dL (ref 65–99)
Potassium: 4.4 mmol/L (ref 3.5–5.3)
Sodium: 130 mmol/L — ABNORMAL LOW (ref 135–146)
Total Bilirubin: 0.7 mg/dL (ref 0.2–1.2)
Total Protein: 6.3 g/dL (ref 6.1–8.1)
eGFR: 91 mL/min/{1.73_m2} (ref >=60)

## 2021-01-27 LAB — CBC WITH DIFFERENTIAL/PLATELET
Absolute Monocytes: 756 cells/uL (ref 200–950)
Basophils Absolute: 79 cells/uL (ref 0–200)
Basophils Relative: 1.3 %
Eosinophils Absolute: 61 cells/uL (ref 15–500)
Eosinophils Relative: 1 %
HCT: 33.1 % — ABNORMAL LOW (ref 35.0–45.0)
Hemoglobin: 11.2 g/dL — ABNORMAL LOW (ref 11.7–15.5)
Lymphs Abs: 1299 cells/uL (ref 850–3900)
MCH: 32.2 pg (ref 27.0–33.0)
MCHC: 33.8 g/dL (ref 32.0–36.0)
MCV: 95.1 fL (ref 80.0–100.0)
MPV: 11.4 fL (ref 7.5–12.5)
Monocytes Relative: 12.4 %
Neutro Abs: 3904 cells/uL (ref 1500–7800)
Neutrophils Relative %: 64 %
Platelets: 233 10*3/uL (ref 140–400)
RBC: 3.48 10*6/uL — ABNORMAL LOW (ref 3.80–5.10)
RDW: 11.4 % (ref 11.0–15.0)
Total Lymphocyte: 21.3 %
WBC: 6.1 10*3/uL (ref 3.8–10.8)

## 2021-01-27 LAB — IRON, TOTAL/TOTAL IRON BINDING CAP
%SAT: 30 % (calc) (ref 16–45)
Iron: 108 ug/dL (ref 45–160)
TIBC: 359 mcg/dL (calc) (ref 250–450)

## 2021-01-27 LAB — URINALYSIS, ROUTINE W REFLEX MICROSCOPIC
Bacteria, UA: NONE SEEN /HPF
Bilirubin Urine: NEGATIVE
Glucose, UA: NEGATIVE
Hgb urine dipstick: NEGATIVE
Hyaline Cast: NONE SEEN /LPF
Ketones, ur: NEGATIVE
Nitrite: NEGATIVE
Protein, ur: NEGATIVE
RBC / HPF: NONE SEEN /HPF (ref 0–2)
Specific Gravity, Urine: 1.008 (ref 1.001–1.035)
Squamous Epithelial / HPF: NONE SEEN /HPF (ref <=5)
WBC, UA: NONE SEEN /HPF (ref 0–5)
pH: 7 (ref 5.0–8.0)

## 2021-01-27 LAB — MICROALBUMIN / CREATININE URINE RATIO
Creatinine, Urine: 19 mg/dL — ABNORMAL LOW (ref 20–275)
Microalb, Ur: <0.2 mg/dL

## 2021-01-27 LAB — FERRITIN: Ferritin: 21 ng/mL (ref 16–288)

## 2021-01-27 LAB — VITAMIN B12: Vitamin B-12: 755 pg/mL (ref 200–1100)

## 2021-01-27 LAB — MAGNESIUM: Magnesium: 2.1 mg/dL (ref 1.5–2.5)

## 2021-01-27 LAB — HEMOGLOBIN A1C
Hgb A1c MFr Bld: 4.8 % of total Hgb (ref <5.7)
Mean Plasma Glucose: 91 mg/dL
eAG (mmol/L): 5 mmol/L

## 2021-01-27 LAB — MICROSCOPIC MESSAGE

## 2021-01-27 LAB — VITAMIN D 25 HYDROXY (VIT D DEFICIENCY, FRACTURES): Vit D, 25-Hydroxy: 76 ng/mL (ref 30–100)

## 2021-01-27 LAB — TSH: TSH: 0.9 mIU/L (ref 0.40–4.50)

## 2021-01-30 ENCOUNTER — Other Ambulatory Visit: Payer: Self-pay

## 2021-01-30 ENCOUNTER — Encounter (HOSPITAL_BASED_OUTPATIENT_CLINIC_OR_DEPARTMENT_OTHER): Payer: Self-pay | Admitting: Cardiology

## 2021-01-30 ENCOUNTER — Ambulatory Visit (HOSPITAL_BASED_OUTPATIENT_CLINIC_OR_DEPARTMENT_OTHER): Payer: Medicare Other | Admitting: Cardiology

## 2021-01-30 VITALS — BP 110/60 | HR 70 | Ht 64.5 in | Wt 96.9 lb

## 2021-01-30 DIAGNOSIS — J449 Chronic obstructive pulmonary disease, unspecified: Secondary | ICD-10-CM | POA: Diagnosis not present

## 2021-01-30 DIAGNOSIS — I340 Nonrheumatic mitral (valve) insufficiency: Secondary | ICD-10-CM | POA: Diagnosis not present

## 2021-01-30 DIAGNOSIS — R0600 Dyspnea, unspecified: Secondary | ICD-10-CM | POA: Diagnosis not present

## 2021-01-30 DIAGNOSIS — Z853 Personal history of malignant neoplasm of breast: Secondary | ICD-10-CM

## 2021-01-30 DIAGNOSIS — Z7189 Other specified counseling: Secondary | ICD-10-CM

## 2021-01-30 DIAGNOSIS — R0609 Other forms of dyspnea: Secondary | ICD-10-CM

## 2021-01-30 NOTE — Progress Notes (Signed)
Cardiology Office Note:    Date:  01/30/2021   ID:  Helen Davis, DOB 1941/03/28, MRN [id number]  PCP:  Unk Pinto, MD  Cardiologist:  [name], MD  Referring MD: Unk Pinto, MD   CC: follow up  History of Present Illness:    Helen Davis is a 80 y.o. female with a hx of mitral regurgitation, breast cancer who is seen for follow up today. I initially met her 02/15/20 as a new consult at the request of Unk Pinto, MD for the evaluation and management of mitral regurgitation.  Today: Overall she feels good.  On some days her intermittent cough is more severe, and she has clear sputum production.  She endorses varicose veins and occasional LE edema.  For exercise, she walks 3 miles every day at the park. Sometimes she struggles with climbing a hill, but is able to push through. She attributes her shortness of breath to COPD, which she reports has been stable for years.  She continues to eat and sleep well.  We discussed in detail the results of her Echo 01/2021, which shows severe mitral valve regurgitation and LVEF 65-70%.  She denies any palpitations, or chest pain. No lightheadedness, headaches, syncope, orthopnea, or PND. She confirms bruising easily.  We discussed TEE at length (see below), and she is amenable. We discussed surgical approach vs mitraclip, and she would much prefer mitraclip if she is a candidate.   Past Medical History:  Diagnosis Date   ABNORMAL EKG    Anemia    Breast cancer ([place])    Fatigue    Mitral valve disorders(424.0)    Vitamin D deficiency     Past Surgical History:  Procedure Laterality Date   APPENDECTOMY  1966   MASTECTOMY     VARICOSE VEIN SURGERY Right 1975    Current Medications: Current Outpatient Medications on File Prior to Visit  Medication Sig   Ascorbic Acid (VITAMIN C) 1000 MG tablet Take 500 mg by mouth daily.    Cholecalciferol (VITAMIN D PO) Take 2,000 Int'l Units by mouth 2 (two)  times daily.   Cyanocobalamin (VITAMIN B 12 PO) Take 500 mcg by mouth daily. Takes 1000 mcg   MAGNESIUM PO Take 500 mg by mouth daily.    Multiple Vitamins-Minerals (CENTRUM SILVER PO) Take by mouth.   Zinc 30 MG TABS Take by mouth daily.   No current facility-administered medications on file prior to visit.     Allergies:   Covid-19 mrna vacc (moderna)   Social History   Tobacco Use   Smoking status: Former    Types: Cigarettes    Quit date: 05/21/1972    Years since quitting: 48.7   Smokeless tobacco: Never  Substance Use Topics   Alcohol use: Yes    Alcohol/week: 3.0 standard drinks    Types: 3 Glasses of wine per week   Drug use: No    Family History: family history includes Atrial fibrillation in her mother; Cancer in an other family member; Dementia in her father; Emphysema in her father; Heart disease in her sister; Hypertension in an other family member; Stroke in her mother and another family member.  ROS:   Please see the history of present illness.   (+) Shortness of breath (+) Cough with sputum production (+) Bilateral LE edema Additional pertinent ROS otherwise unremarkable.  EKGs/Labs/Other Studies Reviewed:    The following studies were reviewed today:  Echo 01/19/2021: 1. Left ventricular ejection fraction, by estimation, is 65 to  70%. Left  ventricular ejection fraction by 3D volume is 67 %. The left ventricle has  normal function. The left ventricle has no regional wall motion  abnormalities. The left ventricular internal cavity size was moderately dilated. Left ventricular diastolic parameters are consistent with Grade III diastolic dysfunction (restrictive). Elevated left ventricular end-diastolic pressure.   2. Right ventricular systolic function is normal. The right ventricular  size is mildly enlarged. There is normal pulmonary artery systolic  pressure.   3. Left atrial size was severely dilated.   4. Right atrial size was moderately dilated.   5.  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 No evidence of  mitral stenosis. There is moderate late systolic prolapse of the of the  posterior leaflet of the mitral valve. Moderate mitral annular  calcification. Severe mitral valve regurgitation with eccentric anteriorly directed jet that wraps around the entire left atrium.   6. The aortic valve is tricuspid. Aortic valve regurgitation is mild.  Mild aortic valve sclerosis is present, with no evidence of aortic valve stenosis. Aortic regurgitation PHT measures 703 msec. Aortic valve area, by VTI measures 1.79 cm. Aortic valve mean gradient measures 3.0 mmHg. Aortic valve Vmax measures 1.25 m/s.   7. There is mild dilatation of the aortic root, measuring 38 mm. There is mild dilatation of the ascending aorta, measuring 38 mm.   8. The inferior vena cava is normal in size with greater than 50%  respiratory variability, suggesting right atrial pressure of 3 mmHg.   9. Additional Comments: There is a cystic structure in the liver  measuring 2.14cm x 2.72cm.  10. Recommend TEE for further evaluation of mitral regurgitation.  11. Compared to prior study, LV dimensions have increased with preserved LVF. Mitral regurgitation appears severe.   Echo 01/06/20  1. Left ventricular ejection fraction, by estimation, is 60 to 65%. The  left ventricle has normal function. The left ventricle has no regional  wall motion abnormalities. Left ventricular diastolic parameters are  consistent with Grade II diastolic dysfunction (pseudonormalization).   2. Right ventricular systolic function is normal. The right ventricular  size is normal. There is normal pulmonary artery systolic pressure.   3. Left atrial size was severely dilated.   4. Likely severe mitral regurgitation with multiple jets. Coanda effect  noted on the interatrial septum. No pumonary vein reverse flow noted, though only one PV is sampled. PISA was not obtained. Consider TEE for further  evaluation if clinically  indicated. The mitral valve is normal in structure. Severe mitral valve  regurgitation. No evidence of mitral stenosis.   5. The aortic valve is tricuspid. Aortic valve regurgitation is mild. No  aortic stenosis is present.   6. The inferior vena cava is normal in size with greater than 50%  respiratory variability, suggesting right atrial pressure of 3 mmHg.   EKG:  EKG is personally reviewed.   01/30/2021: NSR at 70 bpm, LVH 02/15/20: sinus rhythm with PACs, LVH  Recent Labs: 01/26/2021: ALT 17; BUN 16; Creat 0.63; Hemoglobin 11.2; Magnesium 2.1; Platelets 233; Potassium 4.4; Sodium 130; TSH 0.90  Recent Lipid Panel    Component Value Date/Time   CHOL 222 (H) 01/26/2021 1433   TRIG 74 01/26/2021 1433   HDL 121 01/26/2021 1433   CHOLHDL 1.8 01/26/2021 1433   VLDL 15 12/17/2016 1432   LDLCALC 85 01/26/2021 1433    Physical Exam:    VS:  BP 110/60   Pulse 70   Ht 5' 4.5" (1.638 m)  Wt 96 lb 14.4 oz (44 kg)   LMP  (LMP Unknown)   BMI 16.38 kg/m     Wt Readings from Last 3 Encounters:  01/30/21 96 lb 14.4 oz (44 kg)  01/26/21 97 lb 3.2 oz (44.1 kg)  08/04/20 99 lb (44.9 kg)   GEN: Well nourished, well developed in no acute distress HEENT: Normal, moist mucous membranes NECK: No JVD CARDIAC: regular rhythm, normal S1 and S2, no rubs or gallops. 4/6 machinelike HSM most prominent at the apex. VASCULAR: Radial and DP pulses 2+ bilaterally. No carotid bruits RESPIRATORY:  Clear to auscultation without rales, wheezing or rhonchi  ABDOMEN: Soft, non-tender, non-distended MUSCULOSKELETAL:  Ambulates independently SKIN: Warm and dry, no edema NEUROLOGIC:  Alert and oriented x 3. No focal neuro deficits noted. PSYCHIATRIC:  Normal affect    ASSESSMENT:    1. Severe mitral regurgitation   2. Dyspnea on exertion   3. History of breast cancer   4. Chronic obstructive pulmonary disease, unspecified COPD type ([place])   5. Cardiac risk counseling      PLAN:    Severe mitral regurgitation: -loud murmur, but largely asymptomatic (see below re: shortness of breath) -see prior discussion. She initially elected to monitor with serial TTE, but on most recent echo, MR appears worse, with dilation of LVEDD to 6 cm. 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 She would greatly prefer to avoid traditional open heart surgery. She is most interested in Mitraclip if she is a candidate, and her next preference would be for a minimially invasive MV repair if possible. -Shared Decision Making/Informed Consent{ The risks [esophageal damage, perforation (1:10,000 risk), bleeding, pharyngeal hematoma as well as other potential complications associated with conscious sedation including aspiration, arrhythmia, respiratory failure and death], benefits (treatment guidance and diagnostic support) and alternatives of a transesophageal echocardiogram were discussed in detail with Ms. Ingerson and she is willing to proceed.    -if anatomy appears favorable for mitraclip, we would refer to Dr. Burt Knack for further discussion.  Dyspnea on exertion: -chronic, she feels more related to her COPD. Monitor  History of breast cancer s/p bilateral mastectomy and chemo -unclear to me type of chemo, but she was told it was intense -monitor for cardiomyopathy symptoms  Cardiac risk counseling and prevention recommendations: -recommend heart healthy/Mediterranean diet, with whole grains, fruits, vegetable, fish, lean meats, nuts, and olive oil. Limit salt. -recommend moderate walking, 3-5 times/week for 30-50 minutes each session. Aim for at least 150 minutes.week. Goal should be pace of 3 miles/hours, or walking 1.5 miles in 30 minutes -recommend avoidance of tobacco products. Avoid excess alcohol.  -ASCVD risk score: The ASCVD Risk score (Arnett DK, et al., 2019) failed to calculate for the following reasons:   The  valid HDL cholesterol range is 20 to 100 mg/dL    Plan for follow up: 2 mos or sooner as needed. Anticipate referral to Dr. Burt Knack vs. CT surgery in the interim  [name], MD, PhD, [facility]    Medication Adjustments/Labs and Tests Ordered: Current medicines are reviewed at length with the patient today.  Concerns regarding medicines are outlined above.  Orders Placed This Encounter  Procedures   EKG 12-Lead    No orders of the defined types were placed in this encounter.  Patient Instructions  Medication Instructions:  Your physician recommends that you continue on your current medications as directed. Please refer to the Current Medication list given to  you today.   *If you need a refill on your cardiac medications before your next appointment, please call your pharmacy*  Lab Work: NONE  Testing/Procedures: Your physician has requested that you have a TEE. During a TEE, sound waves are used to create images of your heart. It provides your doctor with information about the size and shape of your heart and how well your heart's chambers and valves are working. In this test, a transducer is attached to the end of a flexible tube that's guided down your throat and into your esophagus (the tube leading from you mouth to your stomach) to get a more detailed image of your heart. You are not awake for the procedure. Please see the instruction sheet given to you today. For further information please visit [email or web address].  Follow-Up: At [facility], you and your health needs are our priority.  As part of our continuing mission to provide you with exceptional heart care, we have created designated Provider Care Teams.  These Care Teams include your primary Cardiologist (physician) and Advanced Practice Providers (APPs -  Physician Assistants and Nurse Practitioners) who all work together to provide you with the care you need, when you need it.  We  recommend signing up for the patient portal called "MyChart".  Sign up information is provided on this After Visit Summary.  MyChart is used to connect with patients for Virtual Visits (Telemedicine).  Patients are able to view lab/test results, encounter notes, upcoming appointments, etc.  Non-urgent messages can be sent to your provider as well.   To learn more about what you can do with MyChart, go to [email or web address].    Your next appointment:   2 month(s)  The format for your next appointment:   In Person  Provider:   [name], MD  Other Instructions  You are scheduled for a TEE  on 02/09/2021 with Dr. Recardo Evangelist.  Please arrive at the [facility] (Main Entrance A) at [facility]: [address] [place], [place] [address] at 12 pm. (1 hour prior to procedure unless lab work is needed; if lab work is needed arrive 1.5 hours ahead)  DIET: Nothing to eat or drink after midnight except a sip of water with medications (see medication instructions below)  FYI: For your safety, and to allow [place] to monitor your vital signs accurately during the surgery/procedure we request that   if you have artificial nails, gel coating, SNS etc. Please have those removed prior to your surgery/procedure. Not having the nail coverings /polish removed may result in cancellation or delay of your surgery/procedure.  Medication Instructions: TAKE YOUR REGULAR MEDICATIONS MORNING OF   Labs: CURRENT PER DR Harrell Gave   You must have a responsible person to drive you home and stay in the waiting area during your procedure. Failure to do so could result in cancellation.  Bring your insurance cards.  *Special Note: Every effort is made to have your procedure done on time. Occasionally there are emergencies that occur at the hospital that may cause delays. Please be patient if a delay does occur.    Transesophageal Echocardiogram Transesophageal echocardiogram (TEE) is a test that uses  sound waves to take pictures of your heart. TEE is done by passing a small probe attached to a flexible tube down the part of the body that moves food from your mouth to your stomach (esophagus). The pictures give clear images of your heart. This can help your doctor see if there are problems with your  heart. Tell a doctor about: Any allergies you have. All medicines you are taking. This includes vitamins, herbs, eye drops, creams, and over-the-counter medicines. Any problems you or family members have had with anesthetic medicines. Any blood disorders you have. Any surgeries you have had. Any medical conditions you have. Any swallowing problems. Whether you have or have had a blockage in the part of the body that moves food from your mouth to your stomach. Whether you are pregnant or may be pregnant. What are the risks? In general, this is a safe procedure. But, problems may occur, such as: Damage to nearby structures or organs. A tear in the part of the body that moves food from your mouth to your stomach. Irregular heartbeat. Hoarse voice or trouble swallowing. Bleeding. What happens before the procedure? Medicines Ask your doctor about changing or stopping: Your normal medicines. Vitamins, herbs, and supplements. Over-the-counter medicines. Do not take aspirin or ibuprofen unless you are told to. General instructions Follow instructions from your doctor about what you cannot eat or drink. You will take out any dentures or dental retainers. Plan to have a responsible adult take you home from the hospital or clinic. Plan to have a responsible adult care for you for the time you are told after you leave the hospital or clinic. This is important. What happens during the procedure?  An IV will be put into one of your veins. You may be given: A sedative. This medicine helps you relax. A medicine to numb the back of your throat. This may be sprayed or gargled. Your blood pressure,  heart rate, and breathing will be watched. You may be asked to lie on your left side. A bite block will be placed in your mouth. This keeps you from biting the tube. The tip of the probe will be placed into the back of your mouth. You will be asked to swallow. Your doctor will take pictures of your heart. The probe and bite block will be taken out after the test is done. The procedure may vary among doctors and hospitals. What can I expect after the procedure? You will be monitored until you leave the hospital or clinic. This includes checking your blood pressure, heart rate, breathing rate, and blood oxygen level. Your throat may feel sore and numb. This will get better over time. You will not be allowed to eat or drink until the numbness has gone away. It is common to have a sore throat for a day or two. It is up to you to get the results of your procedure. Ask how to get your results when they are ready. Follow these instructions at home: If you were given a sedative during your procedure, do not drive or use machines until your doctor says that it is safe. Return to your normal activities when your doctor says that it is safe. Keep all follow-up visits. Summary TEE is a test that uses sound waves to take pictures of your heart. You will be given a medicine to help you relax. Do not drive or use machines until your doctor says that it is safe. This information is not intended to replace advice given to you by your health care provider. Make sure you discuss any questions you have with your health care provider. Document Revised: 12/29/2019 Document Reviewed: 12/29/2019 Elsevier Patient Education  2022 [place].    [employer] Stumpf,acting as a Education administrator for [employer], MD.,have documented all relevant documentation on the behalf of [name], MD,as  directed by  [name], MD while in the presence of [name], MD.  I, [name], MD, have reviewed all documentation for this visit. The documentation on 01/30/21 for the exam, diagnosis, procedures, and orders are all accurate and complete.   Signed, [name], MD PhD 01/30/2021  [place]

## 2021-01-30 NOTE — H&P (View-Only) (Signed)
Cardiology Office Note:    Date:  01/30/2021   ID:  Helen Davis, DOB 1941/03/28, MRN [id number]  PCP:  Unk Pinto, MD  Cardiologist:  [name], MD  Referring MD: Unk Pinto, MD   CC: follow up  History of Present Illness:    Helen Davis is a 80 y.o. female with a hx of mitral regurgitation, breast cancer who is seen for follow up today. I initially met her 02/15/20 as a new consult at the request of Unk Pinto, MD for the evaluation and management of mitral regurgitation.  Today: Overall she feels good.  On some days her intermittent cough is more severe, and she has clear sputum production.  She endorses varicose veins and occasional LE edema.  For exercise, she walks 3 miles every day at the park. Sometimes she struggles with climbing a hill, but is able to push through. She attributes her shortness of breath to COPD, which she reports has been stable for years.  She continues to eat and sleep well.  We discussed in detail the results of her Echo 01/2021, which shows severe mitral valve regurgitation and LVEF 65-70%.  She denies any palpitations, or chest pain. No lightheadedness, headaches, syncope, orthopnea, or PND. She confirms bruising easily.  We discussed TEE at length (see below), and she is amenable. We discussed surgical approach vs mitraclip, and she would much prefer mitraclip if she is a candidate.   Past Medical History:  Diagnosis Date   ABNORMAL EKG    Anemia    Breast cancer ([place])    Fatigue    Mitral valve disorders(424.0)    Vitamin D deficiency     Past Surgical History:  Procedure Laterality Date   APPENDECTOMY  1966   MASTECTOMY     VARICOSE VEIN SURGERY Right 1975    Current Medications: Current Outpatient Medications on File Prior to Visit  Medication Sig   Ascorbic Acid (VITAMIN C) 1000 MG tablet Take 500 mg by mouth daily.    Cholecalciferol (VITAMIN D PO) Take 2,000 Int'l Units by mouth 2 (two)  times daily.   Cyanocobalamin (VITAMIN B 12 PO) Take 500 mcg by mouth daily. Takes 1000 mcg   MAGNESIUM PO Take 500 mg by mouth daily.    Multiple Vitamins-Minerals (CENTRUM SILVER PO) Take by mouth.   Zinc 30 MG TABS Take by mouth daily.   No current facility-administered medications on file prior to visit.     Allergies:   Covid-19 mrna vacc (moderna)   Social History   Tobacco Use   Smoking status: Former    Types: Cigarettes    Quit date: 05/21/1972    Years since quitting: 48.7   Smokeless tobacco: Never  Substance Use Topics   Alcohol use: Yes    Alcohol/week: 3.0 standard drinks    Types: 3 Glasses of wine per week   Drug use: No    Family History: family history includes Atrial fibrillation in her mother; Cancer in an other family member; Dementia in her father; Emphysema in her father; Heart disease in her sister; Hypertension in an other family member; Stroke in her mother and another family member.  ROS:   Please see the history of present illness.   (+) Shortness of breath (+) Cough with sputum production (+) Bilateral LE edema Additional pertinent ROS otherwise unremarkable.  EKGs/Labs/Other Studies Reviewed:    The following studies were reviewed today:  Echo 01/19/2021: 1. Left ventricular ejection fraction, by estimation, is 65 to  70%. Left  ventricular ejection fraction by 3D volume is 67 %. The left ventricle has  normal function. The left ventricle has no regional wall motion  abnormalities. The left ventricular internal cavity size was moderately dilated. Left ventricular diastolic parameters are consistent with Grade III diastolic dysfunction (restrictive). Elevated left ventricular end-diastolic pressure.   2. Right ventricular systolic function is normal. The right ventricular  size is mildly enlarged. There is normal pulmonary artery systolic  pressure.   3. Left atrial size was severely dilated.   4. Right atrial size was moderately dilated.   5.  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 No evidence of  mitral stenosis. There is moderate late systolic prolapse of the of the  posterior leaflet of the mitral valve. Moderate mitral annular  calcification. Severe mitral valve regurgitation with eccentric anteriorly directed jet that wraps around the entire left atrium.   6. The aortic valve is tricuspid. Aortic valve regurgitation is mild.  Mild aortic valve sclerosis is present, with no evidence of aortic valve stenosis. Aortic regurgitation PHT measures 703 msec. Aortic valve area, by VTI measures 1.79 cm. Aortic valve mean gradient measures 3.0 mmHg. Aortic valve Vmax measures 1.25 m/s.   7. There is mild dilatation of the aortic root, measuring 38 mm. There is mild dilatation of the ascending aorta, measuring 38 mm.   8. The inferior vena cava is normal in size with greater than 50%  respiratory variability, suggesting right atrial pressure of 3 mmHg.   9. Additional Comments: There is a cystic structure in the liver  measuring 2.14cm x 2.72cm.  10. Recommend TEE for further evaluation of mitral regurgitation.  11. Compared to prior study, LV dimensions have increased with preserved LVF. Mitral regurgitation appears severe.   Echo 01/06/20  1. Left ventricular ejection fraction, by estimation, is 60 to 65%. The  left ventricle has normal function. The left ventricle has no regional  wall motion abnormalities. Left ventricular diastolic parameters are  consistent with Grade II diastolic dysfunction (pseudonormalization).   2. Right ventricular systolic function is normal. The right ventricular  size is normal. There is normal pulmonary artery systolic pressure.   3. Left atrial size was severely dilated.   4. Likely severe mitral regurgitation with multiple jets. Coanda effect  noted on the interatrial septum. No pumonary vein reverse flow noted, though only one PV is sampled. PISA was not obtained. Consider TEE for further  evaluation if clinically  indicated. The mitral valve is normal in structure. Severe mitral valve  regurgitation. No evidence of mitral stenosis.   5. The aortic valve is tricuspid. Aortic valve regurgitation is mild. No  aortic stenosis is present.   6. The inferior vena cava is normal in size with greater than 50%  respiratory variability, suggesting right atrial pressure of 3 mmHg.   EKG:  EKG is personally reviewed.   01/30/2021: NSR at 70 bpm, LVH 02/15/20: sinus rhythm with PACs, LVH  Recent Labs: 01/26/2021: ALT 17; BUN 16; Creat 0.63; Hemoglobin 11.2; Magnesium 2.1; Platelets 233; Potassium 4.4; Sodium 130; TSH 0.90  Recent Lipid Panel    Component Value Date/Time   CHOL 222 (H) 01/26/2021 1433   TRIG 74 01/26/2021 1433   HDL 121 01/26/2021 1433   CHOLHDL 1.8 01/26/2021 1433   VLDL 15 12/17/2016 1432   LDLCALC 85 01/26/2021 1433    Physical Exam:    VS:  BP 110/60   Pulse 70   Ht 5' 4.5" (1.638 m)  Wt 96 lb 14.4 oz (44 kg)   LMP  (LMP Unknown)   BMI 16.38 kg/m     Wt Readings from Last 3 Encounters:  01/30/21 96 lb 14.4 oz (44 kg)  01/26/21 97 lb 3.2 oz (44.1 kg)  08/04/20 99 lb (44.9 kg)   GEN: Well nourished, well developed in no acute distress HEENT: Normal, moist mucous membranes NECK: No JVD CARDIAC: regular rhythm, normal S1 and S2, no rubs or gallops. 4/6 machinelike HSM most prominent at the apex. VASCULAR: Radial and DP pulses 2+ bilaterally. No carotid bruits RESPIRATORY:  Clear to auscultation without rales, wheezing or rhonchi  ABDOMEN: Soft, non-tender, non-distended MUSCULOSKELETAL:  Ambulates independently SKIN: Warm and dry, no edema NEUROLOGIC:  Alert and oriented x 3. No focal neuro deficits noted. PSYCHIATRIC:  Normal affect    ASSESSMENT:    1. Severe mitral regurgitation   2. Dyspnea on exertion   3. History of breast cancer   4. Chronic obstructive pulmonary disease, unspecified COPD type ([place])   5. Cardiac risk counseling      PLAN:    Severe mitral regurgitation: -loud murmur, but largely asymptomatic (see below re: shortness of breath) -see prior discussion. She initially elected to monitor with serial TTE, but on most recent echo, MR appears worse, with dilation of LVEDD to 6 cm. 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 She would greatly prefer to avoid traditional open heart surgery. She is most interested in Mitraclip if she is a candidate, and her next preference would be for a minimially invasive MV repair if possible. -Shared Decision Making/Informed Consent{ The risks [esophageal damage, perforation (1:10,000 risk), bleeding, pharyngeal hematoma as well as other potential complications associated with conscious sedation including aspiration, arrhythmia, respiratory failure and death], benefits (treatment guidance and diagnostic support) and alternatives of a transesophageal echocardiogram were discussed in detail with Ms. Nierenberg and she is willing to proceed.    -if anatomy appears favorable for mitraclip, we would refer to Dr. Burt Knack for further discussion.  Dyspnea on exertion: -chronic, she feels more related to her COPD. Monitor  History of breast cancer s/p bilateral mastectomy and chemo -unclear to me type of chemo, but she was told it was intense -monitor for cardiomyopathy symptoms  Cardiac risk counseling and prevention recommendations: -recommend heart healthy/Mediterranean diet, with whole grains, fruits, vegetable, fish, lean meats, nuts, and olive oil. Limit salt. -recommend moderate walking, 3-5 times/week for 30-50 minutes each session. Aim for at least 150 minutes.week. Goal should be pace of 3 miles/hours, or walking 1.5 miles in 30 minutes -recommend avoidance of tobacco products. Avoid excess alcohol.  -ASCVD risk score: The ASCVD Risk score (Arnett DK, et al., 2019) failed to calculate for the following reasons:   The  valid HDL cholesterol range is 20 to 100 mg/dL    Plan for follow up: 2 mos or sooner as needed. Anticipate referral to Dr. Burt Knack vs. CT surgery in the interim  [name], MD, PhD, [facility]    Medication Adjustments/Labs and Tests Ordered: Current medicines are reviewed at length with the patient today.  Concerns regarding medicines are outlined above.  Orders Placed This Encounter  Procedures   EKG 12-Lead    No orders of the defined types were placed in this encounter.  Patient Instructions  Medication Instructions:  Your physician recommends that you continue on your current medications as directed. Please refer to the Current Medication list given to  you today.   *If you need a refill on your cardiac medications before your next appointment, please call your pharmacy*  Lab Work: NONE  Testing/Procedures: Your physician has requested that you have a TEE. During a TEE, sound waves are used to create images of your heart. It provides your doctor with information about the size and shape of your heart and how well your heart's chambers and valves are working. In this test, a transducer is attached to the end of a flexible tube that's guided down your throat and into your esophagus (the tube leading from you mouth to your stomach) to get a more detailed image of your heart. You are not awake for the procedure. Please see the instruction sheet given to you today. For further information please visit [email or web address].  Follow-Up: At [facility], you and your health needs are our priority.  As part of our continuing mission to provide you with exceptional heart care, we have created designated Provider Care Teams.  These Care Teams include your primary Cardiologist (physician) and Advanced Practice Providers (APPs -  Physician Assistants and Nurse Practitioners) who all work together to provide you with the care you need, when you need it.  We  recommend signing up for the patient portal called "MyChart".  Sign up information is provided on this After Visit Summary.  MyChart is used to connect with patients for Virtual Visits (Telemedicine).  Patients are able to view lab/test results, encounter notes, upcoming appointments, etc.  Non-urgent messages can be sent to your provider as well.   To learn more about what you can do with MyChart, go to [email or web address].    Your next appointment:   2 month(s)  The format for your next appointment:   In Person  Provider:   [name], MD  Other Instructions  You are scheduled for a TEE  on 02/09/2021 with Dr. Recardo Evangelist.  Please arrive at the [facility] (Main Entrance A) at [facility]: [address] [place], [place] [address] at 12 pm. (1 hour prior to procedure unless lab work is needed; if lab work is needed arrive 1.5 hours ahead)  DIET: Nothing to eat or drink after midnight except a sip of water with medications (see medication instructions below)  FYI: For your safety, and to allow [place] to monitor your vital signs accurately during the surgery/procedure we request that   if you have artificial nails, gel coating, SNS etc. Please have those removed prior to your surgery/procedure. Not having the nail coverings /polish removed may result in cancellation or delay of your surgery/procedure.  Medication Instructions: TAKE YOUR REGULAR MEDICATIONS MORNING OF   Labs: CURRENT PER DR Harrell Gave   You must have a responsible person to drive you home and stay in the waiting area during your procedure. Failure to do so could result in cancellation.  Bring your insurance cards.  *Special Note: Every effort is made to have your procedure done on time. Occasionally there are emergencies that occur at the hospital that may cause delays. Please be patient if a delay does occur.    Transesophageal Echocardiogram Transesophageal echocardiogram (TEE) is a test that uses  sound waves to take pictures of your heart. TEE is done by passing a small probe attached to a flexible tube down the part of the body that moves food from your mouth to your stomach (esophagus). The pictures give clear images of your heart. This can help your doctor see if there are problems with your  heart. Tell a doctor about: Any allergies you have. All medicines you are taking. This includes vitamins, herbs, eye drops, creams, and over-the-counter medicines. Any problems you or family members have had with anesthetic medicines. Any blood disorders you have. Any surgeries you have had. Any medical conditions you have. Any swallowing problems. Whether you have or have had a blockage in the part of the body that moves food from your mouth to your stomach. Whether you are pregnant or may be pregnant. What are the risks? In general, this is a safe procedure. But, problems may occur, such as: Damage to nearby structures or organs. A tear in the part of the body that moves food from your mouth to your stomach. Irregular heartbeat. Hoarse voice or trouble swallowing. Bleeding. What happens before the procedure? Medicines Ask your doctor about changing or stopping: Your normal medicines. Vitamins, herbs, and supplements. Over-the-counter medicines. Do not take aspirin or ibuprofen unless you are told to. General instructions Follow instructions from your doctor about what you cannot eat or drink. You will take out any dentures or dental retainers. Plan to have a responsible adult take you home from the hospital or clinic. Plan to have a responsible adult care for you for the time you are told after you leave the hospital or clinic. This is important. What happens during the procedure?  An IV will be put into one of your veins. You may be given: A sedative. This medicine helps you relax. A medicine to numb the back of your throat. This may be sprayed or gargled. Your blood pressure,  heart rate, and breathing will be watched. You may be asked to lie on your left side. A bite block will be placed in your mouth. This keeps you from biting the tube. The tip of the probe will be placed into the back of your mouth. You will be asked to swallow. Your doctor will take pictures of your heart. The probe and bite block will be taken out after the test is done. The procedure may vary among doctors and hospitals. What can I expect after the procedure? You will be monitored until you leave the hospital or clinic. This includes checking your blood pressure, heart rate, breathing rate, and blood oxygen level. Your throat may feel sore and numb. This will get better over time. You will not be allowed to eat or drink until the numbness has gone away. It is common to have a sore throat for a day or two. It is up to you to get the results of your procedure. Ask how to get your results when they are ready. Follow these instructions at home: If you were given a sedative during your procedure, do not drive or use machines until your doctor says that it is safe. Return to your normal activities when your doctor says that it is safe. Keep all follow-up visits. Summary TEE is a test that uses sound waves to take pictures of your heart. You will be given a medicine to help you relax. Do not drive or use machines until your doctor says that it is safe. This information is not intended to replace advice given to you by your health care provider. Make sure you discuss any questions you have with your health care provider. Document Revised: 12/29/2019 Document Reviewed: 12/29/2019 Elsevier Patient Education  2022 [place].    [employer] Stumpf,acting as a Education administrator for [employer], MD.,have documented all relevant documentation on the behalf of [name], MD,as  directed by  [name], MD while in the presence of [name], MD.  I, [name], MD, have reviewed all documentation for this visit. The documentation on 01/30/21 for the exam, diagnosis, procedures, and orders are all accurate and complete.   Signed, [name], MD PhD 01/30/2021  [place]

## 2021-01-30 NOTE — Patient Instructions (Addendum)
Medication Instructions:  Your physician recommends that you continue on your current medications as directed. Please refer to the Current Medication list given to you today.   *If you need a refill on your cardiac medications before your next appointment, please call your pharmacy*  Lab Work: NONE  Testing/Procedures: Your physician has requested that you have a TEE. During a TEE, sound waves are used to create images of your heart. It provides your doctor with information about the size and shape of your heart and how well your heart's chambers and valves are working. In this test, a transducer is attached to the end of a flexible tube that's guided down your throat and into your esophagus (the tube leading from you mouth to your stomach) to get a more detailed image of your heart. You are not awake for the procedure. Please see the instruction sheet given to you today. For further information please visit [email or web address].  Follow-Up: At [facility], you and your health needs are our priority.  As part of our continuing mission to provide you with exceptional heart care, we have created designated Provider Care Teams.  These Care Teams include your primary Cardiologist (physician) and Advanced Practice Providers (APPs -  Physician Assistants and Nurse Practitioners) who all work together to provide you with the care you need, when you need it.  We recommend signing up for the patient portal called "MyChart".  Sign up information is provided on this After Visit Summary.  MyChart is used to connect with patients for Virtual Visits (Telemedicine).  Patients are able to view lab/test results, encounter notes, upcoming appointments, etc.  Non-urgent messages can be sent to your provider as well.   To learn more about what you can do with MyChart, go to [email or web address].    Your next appointment:   2 month(s)  The format for your next appointment:   In Person  Provider:   [name], MD  Other Instructions  You are scheduled for a TEE  on 02/09/2021 with Dr. Recardo Evangelist.  Please arrive at the [facility] (Main Entrance A) at [facility]: [address] [place],  [address] at 12 pm. (1 hour prior to procedure unless lab work is needed; if lab work is needed arrive 1.5 hours ahead)  DIET: Nothing to eat or drink after midnight except a sip of water with medications (see medication instructions below)  FYI: For your safety, and to allow [place] to monitor your vital signs accurately during the surgery/procedure we request that   if you have artificial nails, gel coating, SNS etc. Please have those removed prior to your surgery/procedure. Not having the nail coverings /polish removed may result in cancellation or delay of your surgery/procedure.  Medication Instructions: TAKE YOUR REGULAR MEDICATIONS MORNING OF   Labs: CURRENT PER DR Harrell Gave   You must have a responsible person to drive you home and stay in the waiting area during your procedure. Failure to do so could result in cancellation.  Bring your insurance cards.  *Special Note: Every effort is made to have your procedure done on time. Occasionally there are emergencies that occur at the hospital that may cause delays. Please be patient if a delay does occur.    Transesophageal Echocardiogram Transesophageal echocardiogram (TEE) is a test that uses sound waves to take pictures of your heart. TEE is done by passing a small probe attached to a flexible tube down the part of the body that moves food from your mouth to  your stomach (esophagus). The pictures give clear images of your heart. This can help your doctor see if there are problems with your heart. Tell a doctor about: Any allergies you have. All medicines you are taking. This includes vitamins, herbs, eye drops, creams, and over-the-counter medicines. Any problems you or family members have had with anesthetic medicines. Any blood  disorders you have. Any surgeries you have had. Any medical conditions you have. Any swallowing problems. Whether you have or have had a blockage in the part of the body that moves food from your mouth to your stomach. Whether you are pregnant or may be pregnant. What are the risks? In general, this is a safe procedure. But, problems may occur, such as: Damage to nearby structures or organs. A tear in the part of the body that moves food from your mouth to your stomach. Irregular heartbeat. Hoarse voice or trouble swallowing. Bleeding. What happens before the procedure? Medicines Ask your doctor about changing or stopping: Your normal medicines. Vitamins, herbs, and supplements. Over-the-counter medicines. Do not take aspirin or ibuprofen unless you are told to. General instructions Follow instructions from your doctor about what you cannot eat or drink. You will take out any dentures or dental retainers. Plan to have a responsible adult take you home from the hospital or clinic. Plan to have a responsible adult care for you for the time you are told after you leave the hospital or clinic. This is important. What happens during the procedure?  An IV will be put into one of your veins. You may be given: A sedative. This medicine helps you relax. A medicine to numb the back of your throat. This may be sprayed or gargled. Your blood pressure, heart rate, and breathing will be watched. You may be asked to lie on your left side. A bite block will be placed in your mouth. This keeps you from biting the tube. The tip of the probe will be placed into the back of your mouth. You will be asked to swallow. Your doctor will take pictures of your heart. The probe and bite block will be taken out after the test is done. The procedure may vary among doctors and hospitals. What can I expect after the procedure? You will be monitored until you leave the hospital or clinic. This includes  checking your blood pressure, heart rate, breathing rate, and blood oxygen level. Your throat may feel sore and numb. This will get better over time. You will not be allowed to eat or drink until the numbness has gone away. It is common to have a sore throat for a day or two. It is up to you to get the results of your procedure. Ask how to get your results when they are ready. Follow these instructions at home: If you were given a sedative during your procedure, do not drive or use machines until your doctor says that it is safe. Return to your normal activities when your doctor says that it is safe. Keep all follow-up visits. Summary TEE is a test that uses sound waves to take pictures of your heart. You will be given a medicine to help you relax. Do not drive or use machines until your doctor says that it is safe. This information is not intended to replace advice given to you by your health care provider. Make sure you discuss any questions you have with your health care provider. Document Revised: 12/29/2019 Document Reviewed: 12/29/2019 Elsevier Patient Education  2022 Elsevier  Inc.

## 2021-02-09 ENCOUNTER — Ambulatory Visit (HOSPITAL_BASED_OUTPATIENT_CLINIC_OR_DEPARTMENT_OTHER): Payer: Medicare Other | Admitting: Cardiology

## 2021-02-09 ENCOUNTER — Ambulatory Visit (HOSPITAL_BASED_OUTPATIENT_CLINIC_OR_DEPARTMENT_OTHER)
Admission: RE | Admit: 2021-02-09 | Discharge: 2021-02-09 | Disposition: A | Discharge status: Home / Self Care | Payer: Medicare Other | Source: Home / Self Care | Attending: Cardiovascular Disease | Admitting: Cardiovascular Disease

## 2021-02-09 ENCOUNTER — Ambulatory Visit (HOSPITAL_COMMUNITY): Payer: Medicare Other | Admitting: Certified Registered Nurse Anesthetist

## 2021-02-09 ENCOUNTER — Encounter (HOSPITAL_COMMUNITY)
Admission: RE | Disposition: A | Discharge status: Home / Self Care | Payer: Self-pay | Source: Home / Self Care | Attending: Cardiovascular Disease

## 2021-02-09 ENCOUNTER — Encounter (HOSPITAL_COMMUNITY): Payer: Self-pay | Admitting: Cardiovascular Disease

## 2021-02-09 ENCOUNTER — Ambulatory Visit (HOSPITAL_COMMUNITY)
Admission: RE | Admit: 2021-02-09 | Discharge: 2021-02-09 | Disposition: A | Discharge status: Home / Self Care | Payer: Medicare Other | Attending: Cardiovascular Disease | Admitting: Cardiovascular Disease

## 2021-02-09 ENCOUNTER — Other Ambulatory Visit: Payer: Self-pay

## 2021-02-09 DIAGNOSIS — I340 Nonrheumatic mitral (valve) insufficiency: Secondary | ICD-10-CM | POA: Diagnosis not present

## 2021-02-09 DIAGNOSIS — Z887 Allergy status to serum and vaccine status: Secondary | ICD-10-CM | POA: Diagnosis not present

## 2021-02-09 DIAGNOSIS — I341 Nonrheumatic mitral (valve) prolapse: Secondary | ICD-10-CM | POA: Diagnosis not present

## 2021-02-09 DIAGNOSIS — R0609 Other forms of dyspnea: Secondary | ICD-10-CM | POA: Insufficient documentation

## 2021-02-09 DIAGNOSIS — Z79899 Other long term (current) drug therapy: Secondary | ICD-10-CM | POA: Insufficient documentation

## 2021-02-09 DIAGNOSIS — I083 Combined rheumatic disorders of mitral, aortic and tricuspid valves: Secondary | ICD-10-CM | POA: Diagnosis not present

## 2021-02-09 DIAGNOSIS — I351 Nonrheumatic aortic (valve) insufficiency: Secondary | ICD-10-CM | POA: Diagnosis not present

## 2021-02-09 DIAGNOSIS — E782 Mixed hyperlipidemia: Secondary | ICD-10-CM | POA: Diagnosis not present

## 2021-02-09 DIAGNOSIS — Z853 Personal history of malignant neoplasm of breast: Secondary | ICD-10-CM | POA: Insufficient documentation

## 2021-02-09 DIAGNOSIS — J449 Chronic obstructive pulmonary disease, unspecified: Secondary | ICD-10-CM | POA: Diagnosis not present

## 2021-02-09 DIAGNOSIS — Z87891 Personal history of nicotine dependence: Secondary | ICD-10-CM | POA: Diagnosis not present

## 2021-02-09 DIAGNOSIS — E559 Vitamin D deficiency, unspecified: Secondary | ICD-10-CM | POA: Diagnosis not present

## 2021-02-09 HISTORY — PX: TEE WITHOUT CARDIOVERSION: SHX5443

## 2021-02-09 LAB — ECHO TEE
MV M vel: 5.59 m/s
MV Peak grad: 124.8 mmHg
MV Vena cont: 0.72 cm
Radius: 1.4 cm

## 2021-02-09 SURGERY — ECHOCARDIOGRAM, TRANSESOPHAGEAL
Anesthesia: Monitor Anesthesia Care

## 2021-02-09 MED ORDER — LIDOCAINE 2% (20 MG/ML) 5 ML SYRINGE
INTRAMUSCULAR | Status: DC | PRN
  Administered 2021-02-09 (×2): 20 mg via INTRAVENOUS

## 2021-02-09 MED ORDER — SODIUM CHLORIDE 0.9 % IV SOLN: INTRAVENOUS | Status: DC

## 2021-02-09 MED ORDER — PROPOFOL 10 MG/ML IV BOLUS
INTRAVENOUS | Status: DC | PRN
  Administered 2021-02-09 (×3): 5 mg via INTRAVENOUS
  Administered 2021-02-09 (×4): 10 mg via INTRAVENOUS

## 2021-02-09 MED ORDER — SODIUM CHLORIDE 0.9 % IV SOLN
INTRAVENOUS | Status: DC
  Administered 2021-02-09: 500 mL via INTRAVENOUS

## 2021-02-09 MED ORDER — SODIUM CHLORIDE 0.9 % IV SOLN: INTRAVENOUS | Status: DC | PRN

## 2021-02-09 MED ORDER — PROPOFOL 500 MG/50ML IV EMUL
INTRAVENOUS | Status: DC | PRN
  Administered 2021-02-09: 125 ug/kg/min via INTRAVENOUS

## 2021-02-09 NOTE — Transfer of Care (Signed)
Immediate Anesthesia Transfer of Care Note  Patient: Helen Davis  Procedure(s) Performed: TRANSESOPHAGEAL ECHOCARDIOGRAM (TEE)  Patient Location: Endoscopy Unit  Anesthesia Type:MAC  Level of Consciousness: awake, alert  and patient cooperative  Airway & Oxygen Therapy: Patient Spontanous Breathing and Patient connected to nasal cannula oxygen  Post-op Assessment: Report given to RN and Post -op Vital signs reviewed and stable  Post vital signs: Reviewed  Last Vitals:  Vitals Value Taken Time  BP 117/69 02/09/21 1313  Temp 36.4 C 02/09/21 1313  Pulse 59 02/09/21 1313  Resp 18 02/09/21 1313  SpO2 100 % 02/09/21 1313    Last Pain:  Vitals:   02/09/21 1313  TempSrc: Temporal  PainSc: 0-No pain         Complications: No notable events documented.

## 2021-02-09 NOTE — Op Note (Signed)
INDICATIONS: Severe MR  PROCEDURE:   Informed consent was obtained prior to the procedure. The risks, benefits and alternatives for the procedure were discussed and the patient comprehended these risks.  Risks include, but are not limited to, cough, sore throat, vomiting, nausea, somnolence, esophageal and stomach trauma or perforation, bleeding, low blood pressure, aspiration, pneumonia, infection, trauma to the teeth and death.    After a procedural time-out, the oropharynx was anesthetized with 20% benzocaine spray.   During this procedure the patient was administered IV propofol, Anesthesiology (Dr. Ola Spurr).  The transesophageal probe was inserted in the esophagus and stomach without difficulty and multiple views were obtained.  The patient was kept under observation until the patient left the procedure room.  The patient left the procedure room in stable condition.   Agitated microbubble saline contrast was not administered.  COMPLICATIONS:    There were no immediate complications.  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 Normal left ventricular function.  RECOMMENDATIONS:     Evaluate for surgical repair versus MitraClip. R and L heart cath probably next step before a decision.  Time Spent Directly with the Patient:  30 minutes   Helen Davis 02/09/2021, 1:02 PM

## 2021-02-09 NOTE — Anesthesia Preprocedure Evaluation (Signed)
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  Rhythm:Regular Rate:Normal     Neuro/Psych negative neurological ROS     GI/Hepatic negative GI ROS, Neg liver ROS,   Endo/Other  negative endocrine ROS  Renal/GU negative Renal ROS     Musculoskeletal  (+) Arthritis ,   Abdominal   Peds  Hematology  (+) anemia ,   Anesthesia Other Findings   Reproductive/Obstetrics                             Anesthesia Physical Anesthesia Plan  ASA: 3  Anesthesia Plan: MAC   Post-op Pain Management:    Induction:   PONV Risk Score and Plan: 2 and Propofol infusion, Ondansetron and Treatment may vary due to age or medical condition  Airway Management Planned: Natural Airway and Nasal Cannula  Additional Equipment:   Intra-op Plan:   Post-operative Plan:   Informed Consent: I have reviewed the patients History and Physical, chart, labs and discussed the procedure including the risks, benefits and alternatives for the proposed anesthesia with the patient or authorized representative who has indicated his/her understanding and acceptance.       Plan Discussed with: CRNA  Anesthesia Plan Comments:         Anesthesia Quick Evaluation

## 2021-02-09 NOTE — Progress Notes (Signed)
  Echocardiogram Echocardiogram Transesophageal has been performed.  Fidel Levy 02/09/2021, 2:13 PM

## 2021-02-09 NOTE — Anesthesia Procedure Notes (Signed)
Procedure Name: MAC Date/Time: 02/09/2021 12:41 PM Performed by: Janene Harvey, CRNA Pre-anesthesia Checklist: Patient identified, Emergency Drugs available, Suction available and Patient being monitored Patient Re-evaluated:Patient Re-evaluated prior to induction Oxygen Delivery Method: Nasal cannula Induction Type: IV induction Placement Confirmation: positive ETCO2 Dental Injury: Teeth and Oropharynx as per pre-operative assessment

## 2021-02-09 NOTE — Interval H&P Note (Signed)
History and Physical Interval Note:  02/09/2021 12:33 PM  Helen Davis  has presented today for surgery, with the diagnosis of MITRAL REGURGITATION.  The various methods of treatment have been discussed with the patient and family. After consideration of risks, benefits and other options for treatment, the patient has consented to  Procedure(s): TRANSESOPHAGEAL ECHOCARDIOGRAM (TEE) (N/A) as a surgical intervention.  The patient's history has been reviewed, patient examined, no change in status, stable for surgery.  I have reviewed the patient's chart and labs.  Questions were answered to the patient's satisfaction.     Keyra Virella

## 2021-02-10 ENCOUNTER — Encounter (HOSPITAL_COMMUNITY): Payer: Self-pay | Admitting: Cardiovascular Disease

## 2021-02-10 NOTE — Anesthesia Postprocedure Evaluation (Signed)
Anesthesia Post Note  Patient: Helen Davis  Procedure(s) Performed: TRANSESOPHAGEAL ECHOCARDIOGRAM (TEE)     Patient location during evaluation: PACU Anesthesia Type: MAC Level of consciousness: awake and alert Pain management: pain level controlled Vital Signs Assessment: post-procedure vital signs reviewed and stable Respiratory status: spontaneous breathing, nonlabored ventilation, respiratory function stable and patient connected to nasal cannula oxygen Cardiovascular status: stable and blood pressure returned to baseline Postop Assessment: no apparent nausea or vomiting Anesthetic complications: no   No notable events documented.  Last Vitals:  Vitals:   02/09/21 1322 02/09/21 1335  BP: 110/64 118/89  Pulse: (!) 58 63  Resp: 18 20  Temp:    SpO2: 97% 99%    Last Pain:  Vitals:   02/09/21 1335  TempSrc:   PainSc: 0-No pain                 Tiajuana Amass

## 2021-02-15 ENCOUNTER — Other Ambulatory Visit: Payer: Self-pay

## 2021-02-15 DIAGNOSIS — M79641 Pain in right hand: Secondary | ICD-10-CM

## 2021-02-16 ENCOUNTER — Ambulatory Visit: Payer: Self-pay

## 2021-02-16 ENCOUNTER — Encounter: Payer: Self-pay | Admitting: Orthopedic Surgery

## 2021-02-16 ENCOUNTER — Ambulatory Visit: Payer: Medicare Other | Admitting: Orthopedic Surgery

## 2021-02-16 ENCOUNTER — Other Ambulatory Visit: Payer: Self-pay

## 2021-02-16 VITALS — BP 125/79 | HR 65 | Ht 64.5 in | Wt 97.0 lb

## 2021-02-16 DIAGNOSIS — S66811A Strain of other specified muscles, fascia and tendons at wrist and hand level, right hand, initial encounter: Secondary | ICD-10-CM

## 2021-02-16 DIAGNOSIS — M25531 Pain in right wrist: Secondary | ICD-10-CM

## 2021-02-16 DIAGNOSIS — M79641 Pain in right hand: Secondary | ICD-10-CM | POA: Diagnosis not present

## 2021-02-16 DIAGNOSIS — S66819A Strain of other specified muscles, fascia and tendons at wrist and hand level, unspecified hand, initial encounter: Secondary | ICD-10-CM | POA: Insufficient documentation

## 2021-02-16 NOTE — Progress Notes (Signed)
Office Visit Note   Patient: Helen Davis           Date of Birth: 1940/12/19           MRN: [id number] Visit Date: 02/16/2021              Requested by: Garald Balding, [address],  [place] [id number] PCP: Unk Pinto, MD   Assessment & Plan: Visit Diagnoses:  1. Pain in right wrist   2. Pain in right hand   3. Rupture of extensor tendon of hand, initial encounter     Plan: Discussed with patient that her ring finger extensor tendon has likely ruptured secondary to her prominent ulnar head and presence of DRUJ and extensor synovitis. She is unable to maintain extension of the finger when it is passively put into full extension as you would expect with a saggital band rupture.  She has full PROM which makes MCP dislocation unlikely. She has full extension of her other fingers.  She has a history of severe bilateral hand and wrist arthritis that seems inflammatory despite reported negative RA workup. We discussed treatment options including continued monitoring as she has little functional deficit versus Darrach ulnar head excision with repair of distal ring finger tendon to adjacent middle finger tendon.  It is conceivable that she may rupture another one of her tendons sometime down the line.  She wanted to continue to think about her options but was leaning toward continued observation.   Follow-Up Instructions: No follow-ups on file.   Orders:  Orders Placed This Encounter  Procedures   XR Hand Complete Right   XR Wrist Complete Right   No orders of the defined types were placed in this encounter.     Procedures: No procedures performed   Clinical Data: No additional findings.   Subjective: Chief Complaint  Patient presents with   Right Hand - New Patient (Initial Visit)    This is a 80 yo RHD F who presents w/ atraumatic rupture of the right ring finger extensor tendon.  She reports that she initially had some triggering in this finger but  noticed that her tendon had ruptured Monday night.  This was atraumatic.  She denies pain in her hand or wrist.  She is still able to use her hand w/ minimal difficulty.  She has no pain in her wrist at baseline.  She was reportedly worked up for RA sometime in the past but that the workup was negative or borderline.   Review of Systems  Constitutional: Negative.   Respiratory: Negative.    Cardiovascular: Negative.   Skin: Negative.   Neurological: Negative.     Objective: Vital Signs: BP 125/79 (BP Location: Left Arm, Patient Position: Sitting, Cuff Size: Normal)   Pulse 65   Ht 5' 4.5" (1.638 m)   Wt 97 lb (44 kg)   LMP  (LMP Unknown)   SpO2 96%   BMI 16.39 kg/m   Physical Exam Constitutional:      Appearance: Normal appearance.  Cardiovascular:     Rate and Rhythm: Normal rate.  Pulmonary:     Effort: Pulmonary effort is normal.  Skin:    General: Skin is warm and dry.     Capillary Refill: Capillary refill takes less than 2 seconds.  Neurological:     General: No focal deficit present.     Mental Status: She is alert.    Right Hand Exam   Other  Erythema: absent  Sensation: normal Pulse: present  Comments:  Prominent ulnar head.  Palpable synovitis at DRUJ and central wrist. Very thin and near translucent skin.  Extensor lag of ring finger.  Unable to maintain extension when passively placed into full extension.  Full PROM of ring finger.  Intact flexion.  Intact extension of adjacent fingers.      Specialty Comments:  No specialty comments available.  Imaging: 3V of the R wrist taken today are reviewed and interpreted by me.  They demonstrate severe destructive radiocarpal arthritis w/ prominent dorsal ulnar head.    PMFS History: Patient Active Problem List   Diagnosis Date Noted   Rupture of extensor tendon of hand, initial encounter 02/16/2021   Screening for ischemic heart disease 01/26/2021   Abnormal glucose 08/03/2020   Elevated BP without  diagnosis of hypertension 08/03/2020   Unilateral primary osteoarthritis, right knee 05/13/2019   Varicose veins with ulcer, right ([place]) 02/16/2019   COPD (chronic obstructive pulmonary disease) ([place]) 12/09/2015   Mild malnutrition ([place]) 12/08/2015   DJD (degenerative joint disease) 07/28/2015   Mixed hyperlipidemia 11/15/2014   Osteoporosis 11/15/2014   Anemia    History of breast cancer    Vitamin D deficiency    Nonrheumatic mitral valve regurgitation 10/11/2009   Past Medical History:  Diagnosis Date   ABNORMAL EKG    Anemia    Breast cancer ([place])    Fatigue    Mitral valve disorders(424.0)    Vitamin D deficiency     Family History  Problem Relation Age of Onset   Hypertension Other    Stroke Other    Cancer Other    Atrial fibrillation Mother    Stroke Mother    Emphysema Father    Dementia Father    Heart disease Sister        CHF    Past Surgical History:  Procedure Laterality Date   APPENDECTOMY  1966   MASTECTOMY     TEE WITHOUT CARDIOVERSION N/A 02/09/2021   Procedure: TRANSESOPHAGEAL ECHOCARDIOGRAM (TEE);  Surgeon: Sanda Klein, MD;  [facility] ENDOSCOPY;  Service: Cardiovascular;  Laterality: N/A;   VARICOSE VEIN SURGERY Right 1975   Social History   Occupational History   Not on file  Tobacco Use   Smoking status: Former    Types: Cigarettes    Quit date: 05/21/1972    Years since quitting: 48.7   Smokeless tobacco: Never  Substance and Sexual Activity   Alcohol use: Yes    Alcohol/week: 3.0 standard drinks    Types: 3 Glasses of wine per week   Drug use: No   Sexual activity: Not on file

## 2021-02-21 NOTE — Telephone Encounter (Deleted)
-----   Message from Sherren Mocha, MD sent at 02/21/2021 11:14 AM EDT -----    ----- Message ----- From: Sanda Klein, MD Sent: 02/09/2021   2:04 PM EDT To: Sherren Mocha, MD, [name], MD  She has severe MR, anatomy is amenable to either surgery or Clip. She sounds pretty sturdy - walks 3 miles daily in [place], but gets dyspnea climbing stairs when she went to her sister's in [place]. Spoke to her older son, Collier Salina, and to her about the relative pros and cons of surgery vs clip.  She will be 80 in a couple of days and she is tiny (43 kg), otherwise no contraindication to surgery. Personally, I always worry about surgery in very thin fragile looking ladies. Definitely worth a debate. ----- Message ----- From: Sherren Mocha, MD Sent: 01/31/2021   6:36 AM EDT To: Sanda Klein, MD, [name], MD  Thanks Bridgette. Yes her son and his family are close friends of ours. Mihai keep me in the loop when you do TEE.  Thx Ronalee Belts ----- Message ----- From: [name], MD Sent: 01/30/2021   7:35 PM EDT To: Sanda Klein, MD, Sherren Mocha, MD  Hey team. This is a lovely woman who looks amazing considering what her mitral valve looks like. She couldn't get in for TEE this week, so she is scheduled with you Mihai on the 22nd. We talked about all options, and she would very much prefer Mitraclip if she is a candidate. Ronalee Belts, her son is friends with you I think, so when I mentioned your name as the one who does clips, she was very excited. I reminded her that we don't know that she is a candidate yet. Just an FYI as I anticipate you will both be meeting her soon. Thanks!

## 2021-02-21 NOTE — Telephone Encounter (Signed)
This encounter was created in error - please disregard.

## 2021-02-21 NOTE — Progress Notes (Signed)
Spoke with the patient at length over the phone. I know her personally. She would like to come in for an OP appt to discuss treatment options regarding her MR - med Rx, TEER, surgery as possibilities. Valetta Fuller can you get her scheduled for a new structural consult for MR?  thx

## 2021-02-21 NOTE — Progress Notes (Signed)
Scheduled the patient for Structural Heart evaluation with Dr. Burt Knack 03/10/2021. The patient was grateful for call and agrees with plan.

## 2021-03-09 NOTE — Progress Notes (Signed)
Helen Davis DOB 05-04-1941  This looks like a clippable valve. The fossa looks approachable for transseptal puncture in the Bicaval and SAXB views. LA dimensions are large enough for device steering and straddle. TR is noted. The MR jet is located on the lateral side of A2/P2- medial A1/P1 and is caused primarily by posterior leaflet prolapse. A significant amount of calcium is also noted on the posterior leaflet and annulus. The posterior leaflet measures 1.43 cm in the 149 LVOT grasping view. Gradient measures about 1 mmHG; MVA measures about 7.6 cm2. Based on this information, I'd start with an XTW and assess for gradient.

## 2021-03-10 ENCOUNTER — Ambulatory Visit: Payer: Medicare Other | Admitting: Cardiovascular Disease

## 2021-03-10 ENCOUNTER — Encounter: Payer: Self-pay | Admitting: Cardiovascular Disease

## 2021-03-10 ENCOUNTER — Other Ambulatory Visit: Payer: Self-pay

## 2021-03-10 VITALS — BP 120/80 | HR 64 | Ht 64.5 in | Wt 100.0 lb

## 2021-03-10 DIAGNOSIS — I340 Nonrheumatic mitral (valve) insufficiency: Secondary | ICD-10-CM

## 2021-03-10 NOTE — Progress Notes (Signed)
Cardiology Office Note:    Date:  03/16/2021   ID:  Helen Davis, DOB [birth year], MRN [id number]  PCP:  Unk Pinto, MD   [facility] Providers Cardiologist:  [name], MD     Referring MD: Unk Pinto, MD   Chief Complaint  Patient presents with   Shortness of Breath     History of Present Illness:    Helen Davis is a 80 y.o. female referred by Dr. Harrell Gave for evaluation of severe mitral regurgitation.  The patient is here with her son, Merry Proud, today. She has been healthy and active. She had breast cancer in the 1990's and was treated with chemotherapy and a bilateral mastectomy. She's had no recurrence and hasn't had any significant medical problems over the past few decades. She has had low body weight for a long time and has hovered between 95-100 pounds.   The patient is quite active.  She walks over 3 miles every day for exercise.  She does stop and rest periodically and notes some decrease in her exercise tolerance compared with previous years.  She is not limited in any of her regular daily activities.  She denies orthopnea, PND, leg swelling, or abdominal swelling.  She has no lightheadedness, heart palpitations, or syncope.  She has occasional chest discomfort but this has not been related to physical activity.  Her only limitation is shortness of breath with walking up a hill or at a brisk pace.  The patient reports regular dental care with no current problems. .  Past Medical History:  Diagnosis Date   ABNORMAL EKG    Anemia    Breast cancer ([place])    Fatigue    Mitral valve disorders(424.0)    Vitamin D deficiency     Past Surgical History:  Procedure Laterality Date   APPENDECTOMY  1966   MASTECTOMY     TEE WITHOUT CARDIOVERSION N/A 02/09/2021   Procedure: TRANSESOPHAGEAL ECHOCARDIOGRAM (TEE);  Surgeon: Sanda Klein, MD;  Location: [facility];  Service: Cardiovascular;  Laterality: N/A;   VARICOSE VEIN SURGERY Right  1975    Current Medications: Current Meds  Medication Sig   Bioflavonoid Products (ESTER-C) 500-550 MG TABS Take 500 mg by mouth daily.   Cholecalciferol (VITAMIN D PO) Take 2,000 Int'l Units by mouth 2 (two) times daily.   Cyanocobalamin (VITAMIN B 12 PO) Take 1,000 mcg by mouth daily.   Ibuprofen 200 MG CAPS Take 400-600 mg by mouth daily as needed (arthritis).   MAGNESIUM PO Take 500 mg by mouth every evening. 250 mg   Multiple Vitamins-Minerals (CENTRUM SILVER PO) Take 1 tablet by mouth daily. With iron   Zinc 30 MG TABS Take 30 mg by mouth daily.     Allergies:   Covid-19 mrna vacc (moderna)   Social History   Socioeconomic History   Marital status: Single    Spouse name: Not on file   Number of children: Not on file   Years of education: Not on file   Highest education level: Not on file  Occupational History   Not on file  Tobacco Use   Smoking status: Former    Types: Cigarettes    Quit date: 05/21/1972    Years since quitting: 48.8   Smokeless tobacco: Never  Substance and Sexual Activity   Alcohol use: Yes    Alcohol/week: 3.0 standard drinks    Types: 3 Glasses of wine per week   Drug use: No   Sexual activity: Not on file  Other Topics Concern   Not on file  Social History Narrative   Not on file   Social Determinants of Health   Financial Resource Strain: Not on file  Food Insecurity: Not on file  Transportation Needs: Not on file  Physical Activity: Not on file  Stress: Not on file  Social Connections: Not on file     Family History: The patient's family history includes Atrial fibrillation in her mother; Cancer in an other family member; Dementia in her father; Emphysema in her father; Heart disease in her sister; Hypertension in an other family member; Stroke in her mother and another family member.  ROS:   Please see the history of present illness.    All other systems reviewed and are negative.  EKGs/Labs/Other Studies Reviewed:    The  following studies were reviewed today: Echo 01/19/21:  1. Left ventricular ejection fraction, by estimation, is 65 to 70%. Left  ventricular ejection fraction by 3D volume is 67 %. The left ventricle has  normal function. The left ventricle has no regional wall motion  abnormalities. The left ventricular  internal cavity size was moderately dilated. Left ventricular diastolic  parameters are consistent with Grade III diastolic dysfunction  (restrictive). Elevated left ventricular end-diastolic pressure.   2. Right ventricular systolic function is normal. The right ventricular  size is mildly enlarged. There is normal pulmonary artery systolic  pressure.   3. Left atrial size was severely dilated.   4. Right atrial size was moderately dilated.   5. 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 No evidence of  mitral stenosis. There is moderate late systolic prolapse of the of the  posterior leaflet of the mitral valve. Moderate mitral annular  calcification. Severe mitral valve regurgitation  with eccentric anteriorly directed jet that wraps around the entire left  atrium.   6. The aortic valve is tricuspid. Aortic valve regurgitation is mild.  Mild aortic valve sclerosis is present, with no evidence of aortic valve  stenosis. Aortic regurgitation PHT measures 703 msec. Aortic valve area,  by VTI measures 1.79 cm. Aortic  valve mean gradient measures 3.0 mmHg. Aortic valve Vmax measures 1.25  m/s.   7. There is mild dilatation of the aortic root, measuring 38 mm. There is  mild dilatation of the ascending aorta, measuring 38 mm.   8. The inferior vena cava is normal in size with greater than 50%  respiratory variability, suggesting right atrial pressure of 3 mmHg.   9. Additional Comments: There is a cystic structure in the liver  measuring 2.14cm x 2.72cm.  10. Recommend TEE for further evaluation of mitral regurgitation.  11. Compared to prior study, LV dimensions have increased  with preserved  LVF. Mitral regurgitation appears severe.   Echo TEE 02/09/2021: IMPRESSIONS     1. Left ventricular ejection fraction, by estimation, is 60 to 65%. The  left ventricle has normal function. The left ventricle has no regional  wall motion abnormalities. The left ventricular internal cavity size was  mildly dilated.   2. Right ventricular systolic function is normal. The right ventricular  size is normal. There is normal pulmonary artery systolic pressure.   3. Left atrial size was mild to moderately dilated. No left atrial/left  atrial appendage thrombus was detected.   4. The lateral half of the mitral posterior leaflet middle scallop (P2  towards the P1 subcommissure) has severe prolapse. The effective  regurgitant orifice area is 0.83 cm2, the regurgitant volume 166 ml. the  regurgitant  fraction is 80%. The posterior  leaflet mobile segment length is 11 mm, after allowing for encroachment of  mitral annular calcification into the base of the leaflet. The maximum  coaptation gap is 5 mm, the coaptation defect width is approximately 10  mm. The mitral valve is myxomatous.  Severe mitral valve regurgitation. No evidence of mitral stenosis. There  is severe holosystolic prolapse of the middle scallop of the posterior  leaflet of the mitral valve. The mean mitral valve gradient is 2.0 mmHg  with average heart rate of 66 bpm.   5. Tricuspid valve regurgitation is mild to moderate.   6. The aortic valve is tricuspid. Aortic valve regurgitation is mild. No  aortic stenosis is present.   EKG:  EKG is ordered today.  The ekg ordered today demonstrates normal sinus rhythm 64 bpm, within normal limits.  Recent Labs: 01/26/2021: ALT 17; BUN 16; Creat 0.63; Hemoglobin 11.2; Magnesium 2.1; Platelets 233; Potassium 4.4; Sodium 130; TSH 0.90  Recent Lipid Panel    Component Value Date/Time   CHOL 222 (H) 01/26/2021 1433   TRIG 74 01/26/2021 1433   HDL 121 01/26/2021 1433    CHOLHDL 1.8 01/26/2021 1433   VLDL 15 12/17/2016 1432   LDLCALC 85 01/26/2021 1433        Physical Exam:    VS:  BP 120/80   Pulse 64   Ht 5' 4.5" (1.638 m)   Wt 100 lb (45.4 kg)   LMP  (LMP Unknown)   SpO2 97%   BMI 16.90 kg/m     Wt Readings from Last 3 Encounters:  03/10/21 100 lb (45.4 kg)  02/16/21 97 lb (44 kg)  02/09/21 95 lb (43.1 kg)     GEN:  Thin woman in NAD HEENT: Normal NECK: No JVD; No carotid bruits LYMPHATICS: No lymphadenopathy CHEST: BL mastectomy CARDIAC: RRR, 3/6 holosystolic murmur at the apex, heart throughout RESPIRATORY:  Clear to auscultation without rales, wheezing or rhonchi  ABDOMEN: Soft, non-tender, non-distended MUSCULOSKELETAL:  No edema; No deformity  SKIN: Warm and dry NEUROLOGIC:  Alert and oriented x 3 PSYCHIATRIC:  Normal affect   ASSESSMENT:    1. Severe mitral regurgitation    PLAN:    In order of problems listed above:  The patient is a functionally independent elderly woman with severe, nonrheumatic, stage D mitral regurgitation.  She appears to have Carpentier class II type mitral valve dysfunction with bileaflet prolapse and severe prolapse/flail of the P2 scallop.  The patient has very severe mitral regurgitation with an effective regurgitant orifice area of 0.8 and a regurgitant fraction of 80%.  She is remarkably well compensated with New York Heart Association functional class II symptoms of chronic dyspnea and fatigue.  She is able to walk over 3 miles every day with occasional stopping to rest.  She has no evidence of pulmonary hypertension on noninvasive assessment.  I reviewed treatment options at length with the patient and her son today.  These treatment options include conservative medical management and continued echo surveillance with observation, surgical mitral valve repair with an eye toward minimally invasive surgery, or transcatheter edge-to-edge repair with MitraClip.  I have recommended proceeding with right  and left heart catheterization to help define her treatment options.  We will assess coronary anatomy and hemodynamics with right and left heart catheterization.  I have reviewed the risks, indications, and alternatives to cardiac catheterization, possible angioplasty, and stenting with the patient. Risks include but are not limited to bleeding, infection, vascular  injury, stroke, myocardial infection, arrhythmia, kidney injury, radiation-related injury in the case of prolonged fluoroscopy use, emergency cardiac surgery, and death. The patient understands the risks of serious complication is 1-2 in 7116 with diagnostic cardiac cath and 1-2% or less with angioplasty/stenting.  Once her cardiac catheterization is completed, we will determine next steps in her evaluation.  If we proceed with any definitive treatment, she will be referred for formal cardiac surgical evaluation.  My concerns with edge-to-edge repair include calcification in the posterior mitral annulus and concern for leaflet integrity in this physically frail appearing woman.  Despite her low body weight, she is remarkably active and might be a candidate for surgery.   Shared Decision Making/Informed Consent The risks [stroke (1 in 1000), death (1 in 1000), kidney failure [usually temporary] (1 in 500), bleeding (1 in 200), allergic reaction [possibly serious] (1 in 200)], benefits (diagnostic support and management of coronary artery disease) and alternatives of a cardiac catheterization were discussed in detail with Helen Davis and she is willing to proceed.    Medication Adjustments/Labs and Tests Ordered: Current medicines are reviewed at length with the patient today.  Concerns regarding medicines are outlined above.  Orders Placed This Encounter  Procedures   Basic metabolic panel   CBC with Differential/Platelet   EKG 12-Lead    No orders of the defined types were placed in this encounter.   Patient Instructions  Medication  Instructions:  Your physician recommends that you continue on your current medications as directed. Please refer to the Current Medication list given to you today.  *If you need a refill on your cardiac medications before your next appointment, please call your pharmacy*  Lab Work: Your physician recommends that you return for lab work on 03/29/21 for BMET and CBC  If you have labs (blood work) drawn today and your tests are completely normal, you will receive your results only by: [place] (if you have MyChart) OR A paper copy in the mail If you have any lab test that is abnormal or we need to change your treatment, we will call you to review the results.  Testing/Procedures: Your physician has requested that you have a cardiac catheterization. Cardiac catheterization is used to diagnose and/or treat various heart conditions. Doctors may recommend this procedure for a number of different reasons. The most common reason is to evaluate chest pain. Chest pain can be a symptom of coronary artery disease (CAD), and cardiac catheterization can show whether plaque is narrowing or blocking your heart's arteries. This procedure is also used to evaluate the valves, as well as measure the blood flow and oxygen levels in different parts of your heart. For further information please visit HugeFiesta.[email or web address]. Please follow instruction sheet, as given.  Follow-Up: At [facility], you and your health needs are our priority.  As part of our continuing mission to provide you with exceptional heart care, we have created designated Provider Care Teams.  These Care Teams include your primary Cardiologist (physician) and Advanced Practice Providers (APPs -  Physician Assistants and Nurse Practitioners) who all work together to provide you with the care you need, when you need it.  We recommend signing up for the patient portal called "MyChart".  Sign up information is provided on this After Visit Summary.   MyChart is used to connect with patients for Virtual Visits (Telemedicine).  Patients are able to view lab/test results, encounter notes, upcoming appointments, etc.  Non-urgent messages can be sent to your provider as well.  To learn more about what you can do with MyChart, go to [email or web address].    Your next appointment:   Pending heart cath report.  The format for your next appointment:   In Person  Provider:   Sherren Mocha, MD, Kathyrn Drown, NP, or Nell Range, PA-C   Other Instructions  [place] OFFICE [place], [place] [address] Dept: [phone] Loc: [place]  03/10/2021  You are scheduled for a Cardiac Catheterization on Monday, November 14 with Dr. Sherren Mocha.  1. Please arrive at the [facility] (Main Entrance A) at [facility]: [address] [place], [place] [address] at 5:30 AM (This time is two hours before your procedure to ensure your preparation). Free valet parking service is available.   Special note: Every effort is made to have your procedure done on time. Please understand that emergencies sometimes delay scheduled procedures.  2. Diet: Do not eat solid foods after midnight.  The patient may have clear liquids until 5am upon the day of the procedure.  3. Labs: You will need to have blood drawn on Wednesday, November 9 at [facility] at [facility]. [address]  Open: 7:30am - 5pm    Phone: [phone]. You do not need to be fasting.  4. Medication instructions in preparation for your procedure:   Contrast Allergy: No  On the morning of your procedure, take your Aspirin and any morning medicines NOT listed above.  You may use sips of water.  5. Plan for one night stay--bring personal belongings. 6. Bring a current list of your medications and current insurance cards. 7. You  MUST have a responsible person to drive you home. 8. Someone MUST be with you the first 24 hours after you arrive home or your discharge will be delayed. 9. Please wear clothes that are easy to get on and off and wear slip-on shoes.  Thank you for allowing [place] to care for you!   -- [facility] Cardi[facility] services   Signed, Sherren Mocha, MD  03/16/2021 2:03 PM    [facility]

## 2021-03-10 NOTE — Patient Instructions (Addendum)
Medication Instructions:  Your physician recommends that you continue on your current medications as directed. Please refer to the Current Medication list given to you today.  *If you need a refill on your cardiac medications before your next appointment, please call your pharmacy*  Lab Work: Your physician recommends that you return for lab work on 03/29/21 for BMET and CBC  If you have labs (blood work) drawn today and your tests are completely normal, you will receive your results only by: [place] (if you have MyChart) OR A paper copy in the mail If you have any lab test that is abnormal or we need to change your treatment, we will call you to review the results.  Testing/Procedures: Your physician has requested that you have a cardiac catheterization. Cardiac catheterization is used to diagnose and/or treat various heart conditions. Doctors may recommend this procedure for a number of different reasons. The most common reason is to evaluate chest pain. Chest pain can be a symptom of coronary artery disease (CAD), and cardiac catheterization can show whether plaque is narrowing or blocking your heart's arteries. This procedure is also used to evaluate the valves, as well as measure the blood flow and oxygen levels in different parts of your heart. For further information please visit HugeFiesta.[email or web address]. Please follow instruction sheet, as given.  Follow-Up: At [facility], you and your health needs are our priority.  As part of our continuing mission to provide you with exceptional heart care, we have created designated Provider Care Teams.  These Care Teams include your primary Cardiologist (physician) and Advanced Practice Providers (APPs -  Physician Assistants and Nurse Practitioners) who all work together to provide you with the care you need, when you need it.  We recommend signing up for the patient portal called "MyChart".  Sign up information is provided on this After Visit  Summary.  MyChart is used to connect with patients for Virtual Visits (Telemedicine).  Patients are able to view lab/test results, encounter notes, upcoming appointments, etc.  Non-urgent messages can be sent to your provider as well.   To learn more about what you can do with MyChart, go to [email or web address].    Your next appointment:   Pending heart cath report.  The format for your next appointment:   In Person  Provider:   Sherren Mocha, MD, Kathyrn Drown, NP, or Nell Range, PA-C   Other Instructions  [place] OFFICE [place], [place] [address] Dept: [phone] Loc: [place]  03/10/2021  You are scheduled for a Cardiac Catheterization on Monday, November 14 with Dr. Sherren Mocha.  1. Please arrive at the [facility] (Main Entrance A) at [facility]: [address] [place], [place] [address] at 5:30 AM (This time is two hours before your procedure to ensure your preparation). Free valet parking service is available.   Special note: Every effort is made to have your procedure done on time. Please understand that emergencies sometimes delay scheduled procedures.  2. Diet: Do not eat solid foods after midnight.  The patient may have clear liquids until 5am upon the day of the procedure.  3. Labs: You will need to have blood drawn on Wednesday, November 9 at [facility] at [facility]. [address]  Open: 7:30am - 5pm    Phone: [phone]. You do not need to be fasting.  4. Medication instructions in  preparation for your procedure:   Contrast Allergy: No  On the morning of your procedure, take your Aspirin and any morning medicines NOT listed above.  You may use sips of water.  5. Plan for one night stay--bring personal belongings. 6. Bring a current list of your medications and current insurance  cards. 7. You MUST have a responsible person to drive you home. 8. Someone MUST be with you the first 24 hours after you arrive home or your discharge will be delayed. 9. Please wear clothes that are easy to get on and off and wear slip-on shoes.  Thank you for allowing [place] to care for you!   -- [place] [facility] Cardi[facility] services

## 2021-03-24 ENCOUNTER — Other Ambulatory Visit: Payer: Self-pay | Admitting: *Deleted

## 2021-03-24 DIAGNOSIS — I340 Nonrheumatic mitral (valve) insufficiency: Secondary | ICD-10-CM

## 2021-03-27 ENCOUNTER — Telehealth: Payer: Self-pay

## 2021-03-27 NOTE — Telephone Encounter (Signed)
Records faxed and imaging requested to be power shared to [facility] for referral.

## 2021-04-03 ENCOUNTER — Encounter (HOSPITAL_COMMUNITY): Payer: Self-pay

## 2021-04-03 ENCOUNTER — Ambulatory Visit (HOSPITAL_COMMUNITY): Admit: 2021-04-03 | Payer: Medicare Other | Admitting: Cardiovascular Disease

## 2021-04-03 SURGERY — RIGHT/LEFT HEART CATH AND CORONARY ANGIOGRAPHY
Anesthesia: LOCAL

## 2021-04-04 ENCOUNTER — Ambulatory Visit (HOSPITAL_BASED_OUTPATIENT_CLINIC_OR_DEPARTMENT_OTHER): Payer: Self-pay | Admitting: Cardiology

## 2021-04-10 ENCOUNTER — Encounter (HOSPITAL_BASED_OUTPATIENT_CLINIC_OR_DEPARTMENT_OTHER): Payer: Self-pay | Admitting: Cardiology

## 2021-04-10 ENCOUNTER — Other Ambulatory Visit: Payer: Self-pay

## 2021-04-10 ENCOUNTER — Ambulatory Visit (HOSPITAL_BASED_OUTPATIENT_CLINIC_OR_DEPARTMENT_OTHER): Payer: Medicare Other | Admitting: Cardiology

## 2021-04-10 VITALS — BP 100/66 | HR 77 | Ht 64.5 in | Wt 98.4 lb

## 2021-04-10 DIAGNOSIS — I340 Nonrheumatic mitral (valve) insufficiency: Secondary | ICD-10-CM | POA: Diagnosis not present

## 2021-04-10 DIAGNOSIS — Z712 Person consulting for explanation of examination or test findings: Secondary | ICD-10-CM | POA: Diagnosis not present

## 2021-04-10 NOTE — Patient Instructions (Signed)

## 2021-04-10 NOTE — H&P (View-Only) (Signed)
Cardiology Office Note:    Date:  04/10/2021   ID:  Helen Davis, DOB [birth year], MRN [id number]  PCP:  Unk Pinto, MD  Cardiologist:  [name], MD  Referring MD: Unk Pinto, MD   CC: follow up  History of Present Illness:    Helen Davis is a 80 y.o. female with a hx of mitral regurgitation, breast cancer who is seen for follow up today. I initially met her 02/15/20 as a new consult at the request of Unk Pinto, MD for the evaluation and management of mitral regurgitation.  Today: Overall, she is feeling good. Unfortunately, on 02/25/21 her sister passed away. For years her sister had been struggling with congestive heart failure, Afib, and other health issues.  After discussion with Dr. Burt Knack, she deferred cardiac catheterization. On 04/24/2021 she is scheduled to see Dr. Cheree Ditto to determine if she is a candidate for surgery.  For exercise she walked 4 miles before she arrived today. She will walk 3-5 miles in a given day, weather permitting.   Lately she believes her LE edema has been improving. She is completing leg exercises for this and wearing compression socks more often in the colder weather.  She denies any palpitations, or chest pain. No lightheadedness, headaches, syncope, orthopnea, or PND.   Past Medical History:  Diagnosis Date   ABNORMAL EKG    Anemia    Breast cancer ([place])    Fatigue    Mitral valve disorders(424.0)    Vitamin D deficiency     Past Surgical History:  Procedure Laterality Date   APPENDECTOMY  1966   MASTECTOMY     TEE WITHOUT CARDIOVERSION N/A 02/09/2021   Procedure: TRANSESOPHAGEAL ECHOCARDIOGRAM (TEE);  Surgeon: Sanda Klein, MD;  Location: [facility];  Service: Cardiovascular;  Laterality: N/A;   VARICOSE VEIN SURGERY Right 1975    Current Medications: Current Outpatient Medications on File Prior to Visit  Medication Sig   Bioflavonoid Products (ESTER-C) 500-550 MG TABS Take 500 mg by  mouth daily.   Cholecalciferol (VITAMIN D PO) Take 2,000 Int'l Units by mouth 2 (two) times daily.   Cyanocobalamin (VITAMIN B 12 PO) Take 1,000 mcg by mouth daily.   Ibuprofen 200 MG CAPS Take 400-600 mg by mouth daily as needed (arthritis).   MAGNESIUM PO Take 500 mg by mouth every evening. 250 mg   Multiple Vitamins-Minerals (CENTRUM SILVER PO) Take 1 tablet by mouth daily. With iron   Zinc 30 MG TABS Take 30 mg by mouth daily.   No current facility-administered medications on file prior to visit.     Allergies:   Covid-19 mrna vacc (moderna)   Social History   Tobacco Use   Smoking status: Former    Types: Cigarettes    Quit date: 05/21/1972    Years since quitting: 48.9   Smokeless tobacco: Never  Substance Use Topics   Alcohol use: Yes    Alcohol/week: 3.0 standard drinks    Types: 3 Glasses of wine per week   Drug use: No    Family History: family history includes Atrial fibrillation in her mother; Cancer in an other family member; Dementia in her father; Emphysema in her father; Heart disease in her sister; Hypertension in an other family member; Stroke in her mother and another family member.  ROS:   Please see the history of present illness.   Additional pertinent ROS otherwise unremarkable.  EKGs/Labs/Other Studies Reviewed:    The following studies were reviewed today:  Echo TEE  02/09/2021:  1. Left ventricular ejection fraction, by estimation, is 60 to 65%. The  left ventricle has normal function. The left ventricle has no regional  wall motion abnormalities. The left ventricular internal cavity size was  mildly dilated.   2. Right ventricular systolic function is normal. The right ventricular  size is normal. There is normal pulmonary artery systolic pressure.   3. Left atrial size was mild to moderately dilated. No left atrial/left  atrial appendage thrombus was detected.   4. The lateral half of the mitral posterior leaflet middle scallop (P2  towards the  P1 subcommissure) has severe prolapse. The effective  regurgitant orifice area is 0.83 cm2, the regurgitant volume 166 ml. the  regurgitant fraction is 80%. The posterior  leaflet mobile segment length is 11 mm, after allowing for encroachment of mitral annular calcification into the base of the leaflet. The maximum  coaptation gap is 5 mm, the coaptation defect width is approximately 10  mm. The mitral valve is myxomatous.  Severe mitral valve regurgitation. No evidence of mitral stenosis. There  is severe holosystolic prolapse of the middle scallop of the posterior  leaflet of the mitral valve. The mean mitral valve gradient is 2.0 mmHg  with average heart rate of 66 bpm.   5. Tricuspid valve regurgitation is mild to moderate.   6. The aortic valve is tricuspid. Aortic valve regurgitation is mild. No  aortic stenosis is present.   Echo 01/19/2021: 1. Left ventricular ejection fraction, by estimation, is 65 to 70%. Left  ventricular ejection fraction by 3D volume is 67 %. The left ventricle has  normal function. The left ventricle has no regional wall motion  abnormalities. The left ventricular internal cavity size was moderately dilated. Left ventricular diastolic parameters are consistent with Grade III diastolic dysfunction (restrictive). Elevated left ventricular end-diastolic pressure.   2. Right ventricular systolic function is normal. The right ventricular  size is mildly enlarged. There is normal pulmonary artery systolic  pressure.   3. Left atrial size was severely dilated.   4. Right atrial size was moderately dilated.   5. 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 No evidence of  mitral stenosis. There is moderate late systolic prolapse of the of the  posterior leaflet of the mitral valve. Moderate mitral annular  calcification. Severe mitral valve regurgitation with eccentric anteriorly directed jet that wraps around the entire left atrium.   6. The aortic valve is  tricuspid. Aortic valve regurgitation is mild.  Mild aortic valve sclerosis is present, with no evidence of aortic valve stenosis. Aortic regurgitation PHT measures 703 msec. Aortic valve area, by VTI measures 1.79 cm. Aortic valve mean gradient measures 3.0 mmHg. Aortic valve Vmax measures 1.25 m/s.   7. There is mild dilatation of the aortic root, measuring 38 mm. There is mild dilatation of the ascending aorta, measuring 38 mm.   8. The inferior vena cava is normal in size with greater than 50%  respiratory variability, suggesting right atrial pressure of 3 mmHg.   9. Additional Comments: There is a cystic structure in the liver  measuring 2.14cm x 2.72cm.  10. Recommend TEE for further evaluation of mitral regurgitation.  11. Compared to prior study, LV dimensions have increased with preserved LVF. Mitral regurgitation appears severe.   Echo 01/06/20  1. Left ventricular ejection fraction, by estimation, is 60 to 65%. The  left ventricle has normal function. The left ventricle has no regional  wall motion abnormalities. Left ventricular diastolic parameters are  with Grade II diastolic dysfunction (pseudonormalization).  ° 2. Right ventricular systolic function is normal. The right ventricular  °size is normal. There is normal pulmonary artery systolic pressure.  ° 3. Left atrial size was severely dilated.  ° 4. Likely severe mitral regurgitation with multiple jets. Coanda effect  °noted on the interatrial septum. No pumonary vein reverse flow noted, though only one PV is sampled. PISA was not obtained. Consider TEE for further evaluation if clinically  °indicated. The mitral valve is normal in structure. Severe mitral valve  °regurgitation. No evidence of mitral stenosis.  ° 5. The aortic valve is tricuspid. Aortic valve regurgitation is mild. No  °aortic stenosis is present.  ° 6. The inferior vena cava is normal in size with greater than 50%  °respiratory variability, suggesting right  atrial pressure of 3 mmHg.  ° °EKG:  EKG is personally reviewed.   °04/10/2021: not ordered today °01/30/2021: NSR at 70 bpm, LVH °02/15/20: sinus rhythm with PACs, LVH ° °Recent Labs: °01/26/2021: ALT 17; BUN 16; Creat 0.63; Hemoglobin 11.2; Magnesium 2.1; Platelets 233; Potassium 4.4; Sodium 130; TSH 0.90  ° °Recent Lipid Panel °   °Component Value Date/Time  ° CHOL 222 (H) 01/26/2021 1433  ° TRIG 74 01/26/2021 1433  ° HDL 121 01/26/2021 1433  ° CHOLHDL 1.8 01/26/2021 1433  ° VLDL 15 12/17/2016 1432  ° LDLCALC 85 01/26/2021 1433  ° ° °Physical Exam:   ° °VS:  BP 100/66 (BP Location: Left Arm, Patient Position: Sitting, Cuff Size: Normal)    Pulse 77    Ht 5' 4.5" (1.638 m)    Wt 98 lb 6.4 oz (44.6 kg)    LMP  (LMP Unknown)    SpO2 98%    BMI 16.63 kg/m²    ° °Wt Readings from Last 3 Encounters:  °04/10/21 98 lb 6.4 oz (44.6 kg)  °03/10/21 100 lb (45.4 kg)  °02/16/21 97 lb (44 kg)  ° °GEN: Well nourished, well developed in no acute distress °HEENT: Normal, moist mucous membranes °NECK: No JVD °CARDIAC: regular rhythm, normal S1 and S2, no rubs or gallops. 4/6 machinelike HSM most prominent at the apex. °VASCULAR: Radial and DP pulses 2+ bilaterally. No carotid bruits °RESPIRATORY:  Clear to auscultation without rales, wheezing or rhonchi  °ABDOMEN: Soft, non-tender, non-distended °MUSCULOSKELETAL:  Ambulates independently °SKIN: Warm and dry, no edema °NEUROLOGIC:  Alert and oriented x 3. No focal neuro deficits noted. °PSYCHIATRIC:  Normal affect   ° °ASSESSMENT:   ° °1. Severe mitral regurgitation   °2. Encounter to discuss test results   ° °PLAN:   ° °Severe mitral regurgitation: °-loud murmur, but largely asymptomatic °-see prior discussion. She initially elected to monitor with serial TTE, but on most recent echo, MR appears worse, with dilation of LVEDD to 6 cm. 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 Cooper. Recommended evaluation with Dr. Gaca at  [facility] to see if she is a candidate for minimally invasive mitral valve surgery. She has an appt next week.  °-if she is a candidate for MV repair, will need cath. Discussed this today. She initially had it scheduled but then cancelled, wanted to see if she would be a candidate before having cath but she is amenable to it if deemed a candidate °-she overall feels well and wonders what would happen if she didn't have her MR fixed. We discussion risk of progressive dilation and heart failure. Also discussed timing, strength for recovery, etc.  ° °  Dyspnea on exertion: °-chronic, nonlimiting, mild, able to walk for miles ° °History of breast cancer s/p bilateral mastectomy and chemo °-unclear to me type of chemo, but she was told it was intense °-monitor for cardiomyopathy symptoms ° °Cardiac risk counseling and prevention recommendations: °-recommend heart healthy/Mediterranean diet, with whole grains, fruits, vegetable, fish, lean meats, nuts, and olive oil. Limit salt. °-recommend moderate walking, 3-5 times/week for 30-50 minutes each session. Aim for at least 150 minutes.week. Goal should be pace of 3 miles/hours, or walking 1.5 miles in 30 minutes °-recommend avoidance of tobacco products. Avoid excess alcohol.  ° °Plan for follow up: 6 mos or sooner Pending her decision for surgery. ° °Bridgette Christopher, MD, PhD, FACC °[place]   [facility]   ° °Medication Adjustments/Labs and Tests Ordered: °Current medicines are reviewed at length with the patient today.  Concerns regarding medicines are outlined above.  ° °No orders of the defined types were placed in this encounter. ° °No orders of the defined types were placed in this encounter. ° °Patient Instructions  °Medication Instructions:  °Your Physician recommend you continue on your current medication as directed.   ° °*If you need a refill on your cardiac medications before your next appointment, please call your pharmacy* ° ° °Lab Work: °None ordered  today ° ° °Testing/Procedures: °None ordered today ° ° °Follow-Up: °At [facility], you and your health needs are our priority.  As part of our continuing mission to provide you with exceptional heart care, we have created designated Provider Care Teams.  These Care Teams include your primary Cardiologist (physician) and Advanced Practice Providers (APPs -  Physician Assistants and Nurse Practitioners) who all work together to provide you with the care you need, when you need it. ° °We recommend signing up for the patient portal called "MyChart".  Sign up information is provided on this After Visit Summary.  MyChart is used to connect with patients for Virtual Visits (Telemedicine).  Patients are able to view lab/test results, encounter notes, upcoming appointments, etc.  Non-urgent messages can be sent to your provider as well.   °To learn more about what you can do with MyChart, go to [email or web address].   ° °Your next appointment:   °6 month(s) ° °The format for your next appointment:   °In Person ° °Provider:   °Bridgette Christopher, MD  ° ° °  ° °I,Mathew Stumpf,acting as a scribe for Bridgette Christopher, MD.,have documented all relevant documentation on the behalf of Bridgette Christopher, MD,as directed by  Bridgette Christopher, MD while in the presence of Bridgette Christopher, MD. ° °I, Bridgette Christopher, MD, have reviewed all documentation for this visit. The documentation on 04/10/21 for the exam, diagnosis, procedures, and orders are all accurate and complete.  ° °Total time of encounter: °29 minutes total time of encounter, including 22 minutes spent in face-to-face patient care. This time includes coordination of care and counseling regarding options for management. Remainder of non-face-to-face time involved reviewing chart documents/testing relevant to the patient encounter and documentation in the medical record. ° °Bridgette Christopher, MD, PhD, FACC °[place]   [facility]    ° °Signed, °Bridgette Christopher, MD PhD °04/10/2021  °[facility] °

## 2021-04-10 NOTE — Progress Notes (Signed)
°Cardiology Office Note:   ° °Date:  04/10/2021  ° °ID:  Lasheka M Cerrone, DOB 07/17/1940, MRN [id number] ° °PCP:  McKeown, William, MD  °Cardiologist:  Rashee Marschall, MD ° °Referring MD: McKeown, William, MD  ° °CC: follow up ° °History of Present Illness:   ° °Helen Davis is a 80 y.o. female with a hx of mitral regurgitation, breast cancer who is seen for follow up today. I initially met her 02/15/20 as a new consult at the request of McKeown, William, MD for the evaluation and management of mitral regurgitation. ° °Today: °Overall, she is feeling good. Unfortunately, on 02/23/2021 her sister passed away. For years her sister had been struggling with congestive heart failure, Afib, and other health issues. ° °After discussion with Dr. Cooper, she deferred cardiac catheterization. On 04/24/2021 she is scheduled to see Dr. Gaca to determine if she is a candidate for surgery. ° °For exercise she walked 4 miles before she arrived today. She will walk 3-5 miles in a given day, weather permitting.  ° °Lately she believes her LE edema has been improving. She is completing leg exercises for this and wearing compression socks more often in the colder weather. ° °She denies any palpitations, or chest pain. No lightheadedness, headaches, syncope, orthopnea, or PND. ° ° °Past Medical History:  °Diagnosis Date  ° ABNORMAL EKG   ° Anemia   ° Breast cancer (HCC)   ° Fatigue   ° Mitral valve disorders(424.0)   ° Vitamin D deficiency   ° ° °Past Surgical History:  °Procedure Laterality Date  ° APPENDECTOMY  1966  ° MASTECTOMY    ° TEE WITHOUT CARDIOVERSION N/A 02/09/2021  ° Procedure: TRANSESOPHAGEAL ECHOCARDIOGRAM (TEE);  Surgeon: Croitoru, Mihai, MD;  Location: [facility];  Service: Cardiovascular;  Laterality: N/A;  ° VARICOSE VEIN SURGERY Right 1975  ° ° °Current Medications: °Current Outpatient Medications on File Prior to Visit  °Medication Sig  ° Bioflavonoid Products (ESTER-C) 500-550 MG TABS Take 500 mg by  mouth daily.  ° Cholecalciferol (VITAMIN D PO) Take 2,000 Int'l Units by mouth 2 (two) times daily.  ° Cyanocobalamin (VITAMIN B 12 PO) Take 1,000 mcg by mouth daily.  ° Ibuprofen 200 MG CAPS Take 400-600 mg by mouth daily as needed (arthritis).  ° MAGNESIUM PO Take 500 mg by mouth every evening. 250 mg  ° Multiple Vitamins-Minerals (CENTRUM SILVER PO) Take 1 tablet by mouth daily. With iron  ° Zinc 30 MG TABS Take 30 mg by mouth daily.  ° °No current facility-administered medications on file prior to visit.  °  ° °Allergies:   Covid-19 mrna vacc (moderna)  ° °Social History  ° °Tobacco Use  ° Smoking status: Former  °  Types: Cigarettes  °  Quit date: 05/21/1972  °  Years since quitting: 48.9  ° Smokeless tobacco: Never  °Substance Use Topics  ° Alcohol use: Yes  °  Alcohol/week: 3.0 standard drinks  °  Types: 3 Glasses of wine per week  ° Drug use: No  ° ° °Family History: °family history includes Atrial fibrillation in her mother; Cancer in an other family member; Dementia in her father; Emphysema in her father; Heart disease in her sister; Hypertension in an other family member; Stroke in her mother and another family member. ° °ROS:   °Please see the history of present illness.   °Additional pertinent ROS otherwise unremarkable. ° °EKGs/Labs/Other Studies Reviewed:   ° °The following studies were reviewed today: ° °Echo TEE 02/09/2021: °   1. Left ventricular ejection fraction, by estimation, is 60 to 65%. The  °left ventricle has normal function. The left ventricle has no regional  °wall motion abnormalities. The left ventricular internal cavity size was  °mildly dilated.  ° 2. Right ventricular systolic function is normal. The right ventricular  °size is normal. There is normal pulmonary artery systolic pressure.  ° 3. Left atrial size was mild to moderately dilated. No left atrial/left  °atrial appendage thrombus was detected.  ° 4. The lateral half of the mitral posterior leaflet middle scallop (P2  °towards the  P1 subcommissure) has severe prolapse. The effective  °regurgitant orifice area is 0.83 cm2, the regurgitant volume 166 ml. the  °regurgitant fraction is 80%. The posterior  °leaflet mobile segment length is 11 mm, after allowing for encroachment of mitral annular calcification into the base of the leaflet. The maximum  °coaptation gap is 5 mm, the coaptation defect width is approximately 10  °mm. The mitral valve is myxomatous.  °Severe mitral valve regurgitation. No evidence of mitral stenosis. There  °is severe holosystolic prolapse of the middle scallop of the posterior  °leaflet of the mitral valve. The mean mitral valve gradient is 2.0 mmHg  °with average heart rate of 66 bpm.  ° 5. Tricuspid valve regurgitation is mild to moderate.  ° 6. The aortic valve is tricuspid. Aortic valve regurgitation is mild. No  °aortic stenosis is present.  ° °Echo 01/19/2021: °1. Left ventricular ejection fraction, by estimation, is 65 to 70%. Left  °ventricular ejection fraction by 3D volume is 67 %. The left ventricle has  °normal function. The left ventricle has no regional wall motion  °abnormalities. The left ventricular internal cavity size was moderately dilated. Left ventricular diastolic parameters are consistent with Grade III diastolic dysfunction (restrictive). Elevated left ventricular end-diastolic pressure.  ° 2. Right ventricular systolic function is normal. The right ventricular  °size is mildly enlarged. There is normal pulmonary artery systolic  °pressure.  ° 3. Left atrial size was severely dilated.  ° 4. Right atrial size was moderately dilated.  ° 5. 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 No evidence of  °mitral stenosis. There is moderate late systolic prolapse of the of the  °posterior leaflet of the mitral valve. Moderate mitral annular  °calcification. Severe mitral valve regurgitation with eccentric anteriorly directed jet that wraps around the entire left atrium.  ° 6. The aortic valve is  tricuspid. Aortic valve regurgitation is mild.  °Mild aortic valve sclerosis is present, with no evidence of aortic valve stenosis. Aortic regurgitation PHT measures 703 msec. Aortic valve area, by VTI measures 1.79 cm². Aortic valve mean gradient measures 3.0 mmHg. Aortic valve Vmax measures 1.25 m/s.  ° 7. There is mild dilatation of the aortic root, measuring 38 mm. There is mild dilatation of the ascending aorta, measuring 38 mm.  ° 8. The inferior vena cava is normal in size with greater than 50%  °respiratory variability, suggesting right atrial pressure of 3 mmHg.  ° 9. Additional Comments: There is a cystic structure in the liver  °measuring 2.14cm x 2.72cm.  °10. Recommend TEE for further evaluation of mitral regurgitation.  °11. Compared to prior study, LV dimensions have increased with preserved LVF. Mitral regurgitation appears severe.  ° °Echo 01/06/20 ° 1. Left ventricular ejection fraction, by estimation, is 60 to 65%. The  °left ventricle has normal function. The left ventricle has no regional  °wall motion abnormalities. Left ventricular diastolic parameters are  °consistent   with Grade II diastolic dysfunction (pseudonormalization).  ° 2. Right ventricular systolic function is normal. The right ventricular  °size is normal. There is normal pulmonary artery systolic pressure.  ° 3. Left atrial size was severely dilated.  ° 4. Likely severe mitral regurgitation with multiple jets. Coanda effect  °noted on the interatrial septum. No pumonary vein reverse flow noted, though only one PV is sampled. PISA was not obtained. Consider TEE for further evaluation if clinically  °indicated. The mitral valve is normal in structure. Severe mitral valve  °regurgitation. No evidence of mitral stenosis.  ° 5. The aortic valve is tricuspid. Aortic valve regurgitation is mild. No  °aortic stenosis is present.  ° 6. The inferior vena cava is normal in size with greater than 50%  °respiratory variability, suggesting right  atrial pressure of 3 mmHg.  ° °EKG:  EKG is personally reviewed.   °04/10/2021: not ordered today °01/30/2021: NSR at 70 bpm, LVH °02/15/20: sinus rhythm with PACs, LVH ° °Recent Labs: °01/26/2021: ALT 17; BUN 16; Creat 0.63; Hemoglobin 11.2; Magnesium 2.1; Platelets 233; Potassium 4.4; Sodium 130; TSH 0.90  ° °Recent Lipid Panel °   °Component Value Date/Time  ° CHOL 222 (H) 01/26/2021 1433  ° TRIG 74 01/26/2021 1433  ° HDL 121 01/26/2021 1433  ° CHOLHDL 1.8 01/26/2021 1433  ° VLDL 15 12/17/2016 1432  ° LDLCALC 85 01/26/2021 1433  ° ° °Physical Exam:   ° °VS:  BP 100/66 (BP Location: Left Arm, Patient Position: Sitting, Cuff Size: Normal)    Pulse 77    Ht 5' 4.5" (1.638 m)    Wt 98 lb 6.4 oz (44.6 kg)    LMP  (LMP Unknown)    SpO2 98%    BMI 16.63 kg/m²    ° °Wt Readings from Last 3 Encounters:  °04/10/21 98 lb 6.4 oz (44.6 kg)  °03/10/21 100 lb (45.4 kg)  °02/16/21 97 lb (44 kg)  ° °GEN: Well nourished, well developed in no acute distress °HEENT: Normal, moist mucous membranes °NECK: No JVD °CARDIAC: regular rhythm, normal S1 and S2, no rubs or gallops. 4/6 machinelike HSM most prominent at the apex. °VASCULAR: Radial and DP pulses 2+ bilaterally. No carotid bruits °RESPIRATORY:  Clear to auscultation without rales, wheezing or rhonchi  °ABDOMEN: Soft, non-tender, non-distended °MUSCULOSKELETAL:  Ambulates independently °SKIN: Warm and dry, no edema °NEUROLOGIC:  Alert and oriented x 3. No focal neuro deficits noted. °PSYCHIATRIC:  Normal affect   ° °ASSESSMENT:   ° °1. Severe mitral regurgitation   °2. Encounter to discuss test results   ° °PLAN:   ° °Severe mitral regurgitation: °-loud murmur, but largely asymptomatic °-see prior discussion. She initially elected to monitor with serial TTE, but on most recent echo, MR appears worse, with dilation of LVEDD to 6 cm. 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 Cooper. Recommended evaluation with Dr. Gaca at  [facility] to see if she is a candidate for minimally invasive mitral valve surgery. She has an appt next week.  °-if she is a candidate for MV repair, will need cath. Discussed this today. She initially had it scheduled but then cancelled, wanted to see if she would be a candidate before having cath but she is amenable to it if deemed a candidate °-she overall feels well and wonders what would happen if she didn't have her MR fixed. We discussion risk of progressive dilation and heart failure. Also discussed timing, strength for recovery, etc.  ° °  Dyspnea on exertion: °-chronic, nonlimiting, mild, able to walk for miles ° °History of breast cancer s/p bilateral mastectomy and chemo °-unclear to me type of chemo, but she was told it was intense °-monitor for cardiomyopathy symptoms ° °Cardiac risk counseling and prevention recommendations: °-recommend heart healthy/Mediterranean diet, with whole grains, fruits, vegetable, fish, lean meats, nuts, and olive oil. Limit salt. °-recommend moderate walking, 3-5 times/week for 30-50 minutes each session. Aim for at least 150 minutes.week. Goal should be pace of 3 miles/hours, or walking 1.5 miles in 30 minutes °-recommend avoidance of tobacco products. Avoid excess alcohol.  ° °Plan for follow up: 6 mos or sooner Pending her decision for surgery. ° °Sameena Artus, MD, PhD, FACC °[place]   [facility]   ° °Medication Adjustments/Labs and Tests Ordered: °Current medicines are reviewed at length with the patient today.  Concerns regarding medicines are outlined above.  ° °No orders of the defined types were placed in this encounter. ° °No orders of the defined types were placed in this encounter. ° °Patient Instructions  °Medication Instructions:  °Your Physician recommend you continue on your current medication as directed.   ° °*If you need a refill on your cardiac medications before your next appointment, please call your pharmacy* ° ° °Lab Work: °None ordered  today ° ° °Testing/Procedures: °None ordered today ° ° °Follow-Up: °At [facility], you and your health needs are our priority.  As part of our continuing mission to provide you with exceptional heart care, we have created designated Provider Care Teams.  These Care Teams include your primary Cardiologist (physician) and Advanced Practice Providers (APPs -  Physician Assistants and Nurse Practitioners) who all work together to provide you with the care you need, when you need it. ° °We recommend signing up for the patient portal called "MyChart".  Sign up information is provided on this After Visit Summary.  MyChart is used to connect with patients for Virtual Visits (Telemedicine).  Patients are able to view lab/test results, encounter notes, upcoming appointments, etc.  Non-urgent messages can be sent to your provider as well.   °To learn more about what you can do with MyChart, go to [email or web address].   ° °Your next appointment:   °6 month(s) ° °The format for your next appointment:   °In Person ° °Provider:   °Jaxxen Voong, MD  ° ° °  ° °I,Mathew Stumpf,acting as a scribe for Kamden Stanislaw, MD.,have documented all relevant documentation on the behalf of Jonmichael Beadnell, MD,as directed by  Armani Gawlik, MD while in the presence of Lynleigh Kovack, MD. ° °I, Santanna Olenik, MD, have reviewed all documentation for this visit. The documentation on 04/10/21 for the exam, diagnosis, procedures, and orders are all accurate and complete.  ° °Total time of encounter: °29 minutes total time of encounter, including 22 minutes spent in face-to-face patient care. This time includes coordination of care and counseling regarding options for management. Remainder of non-face-to-face time involved reviewing chart documents/testing relevant to the patient encounter and documentation in the medical record. ° °Carlester Kasparek, MD, PhD, FACC °[place]   [facility]    ° °Signed, °Cheril Slattery, MD PhD °04/10/2021  °[facility] °

## 2021-04-17 DIAGNOSIS — I340 Nonrheumatic mitral (valve) insufficiency: Secondary | ICD-10-CM | POA: Diagnosis not present

## 2021-04-18 ENCOUNTER — Telehealth: Payer: Self-pay

## 2021-04-18 NOTE — Telephone Encounter (Signed)
The patient called back. All instructions reviewed and she has no questions. She was grateful for assistance.

## 2021-04-18 NOTE — Telephone Encounter (Signed)
Per Dr. Burt Knack and Dr. Cheree Ditto, scheduled Ms. Helen Davis for left and right heart catheterization on 05/05/2021.  She will have labs drawn 04/26/2021.  MyChart message sent with instructions. She was grateful for call and agrees with plan.

## 2021-04-25 NOTE — Telephone Encounter (Signed)
Per patient request, rescheduled lab appointment from tomorrow to Friday. She was grateful for assistance.

## 2021-04-26 ENCOUNTER — Other Ambulatory Visit: Payer: Medicare Other

## 2021-04-28 ENCOUNTER — Other Ambulatory Visit: Payer: Medicare Other | Admitting: *Deleted

## 2021-04-28 ENCOUNTER — Other Ambulatory Visit: Payer: Self-pay

## 2021-04-28 DIAGNOSIS — I340 Nonrheumatic mitral (valve) insufficiency: Secondary | ICD-10-CM | POA: Diagnosis not present

## 2021-04-28 LAB — CBC WITH DIFFERENTIAL/PLATELET
Basophils Absolute: 0.1 10*3/uL (ref 0.0–0.2)
Basos: 2 %
EOS (ABSOLUTE): 0.1 10*3/uL (ref 0.0–0.4)
Eos: 2 %
Hematocrit: 32.6 % — ABNORMAL LOW (ref 34.0–46.6)
Hemoglobin: 11.1 g/dL (ref 11.1–15.9)
Immature Grans (Abs): 0 10*3/uL (ref 0.0–0.1)
Immature Granulocytes: 0 %
Lymphocytes Absolute: 1.2 10*3/uL (ref 0.7–3.1)
Lymphs: 24 %
MCH: 31.4 pg (ref 26.6–33.0)
MCHC: 34 g/dL (ref 31.5–35.7)
MCV: 92 fL (ref 79–97)
Monocytes Absolute: 0.8 10*3/uL (ref 0.1–0.9)
Monocytes: 16 %
Neutrophils Absolute: 3 10*3/uL (ref 1.4–7.0)
Neutrophils: 56 %
Platelets: 191 10*3/uL (ref 150–450)
RBC: 3.53 x10E6/uL — ABNORMAL LOW (ref 3.77–5.28)
RDW: 11.3 % — ABNORMAL LOW (ref 11.7–15.4)
WBC: 5.2 10*3/uL (ref 3.4–10.8)

## 2021-04-28 LAB — BASIC METABOLIC PANEL
BUN/Creatinine Ratio: 12 (ref 12–28)
BUN: 9 mg/dL (ref 8–27)
CO2: 25 mmol/L (ref 20–29)
Calcium: 9.1 mg/dL (ref 8.7–10.3)
Chloride: 93 mmol/L — ABNORMAL LOW (ref 96–106)
Creatinine, Ser: 0.73 mg/dL (ref 0.57–1.00)
Glucose: 90 mg/dL (ref 70–99)
Potassium: 4.7 mmol/L (ref 3.5–5.2)
Sodium: 129 mmol/L — ABNORMAL LOW (ref 134–144)
eGFR: 84 mL/min/{1.73_m2} (ref >59)

## 2021-05-04 ENCOUNTER — Telehealth: Payer: Self-pay | Admitting: *Deleted

## 2021-05-04 NOTE — Telephone Encounter (Signed)
Cardiac catheterization scheduled at [facility] for: Friday May 05, 2021 7:30 AM [facility] Main Entrance A [facility]) at: 5:30 AM   Diet-no solid food after midnight prior to cath, clear liquids until 5 AM day of procedure.  Medication instructions for procedure: -Usual morning medications can be taken pre-cath with sips of water including aspirin 81 mg.    Confirmed patient has responsible adult to drive home post procedure and be with patient first 24 hours after arriving home.  [facility] does allow one visitor to accompany you and wait in the hospital waiting room while you are there for your procedure. You and your visitor will be asked to wear a mask once you enter the hospital.   Patient reports does not currently have any new symptoms concerning for COVID-19 and no household members with COVID-19 like illness.    Reviewed procedure/mask/visitor instructions with patient.

## 2021-05-05 ENCOUNTER — Encounter (HOSPITAL_COMMUNITY)
Admission: RE | Disposition: A | Discharge status: Home / Self Care | Payer: Self-pay | Source: Home / Self Care | Attending: Cardiovascular Disease

## 2021-05-05 ENCOUNTER — Encounter (HOSPITAL_COMMUNITY): Payer: Self-pay | Admitting: Cardiovascular Disease

## 2021-05-05 ENCOUNTER — Other Ambulatory Visit: Payer: Self-pay

## 2021-05-05 ENCOUNTER — Ambulatory Visit (HOSPITAL_COMMUNITY)
Admission: RE | Admit: 2021-05-05 | Discharge: 2021-05-05 | Disposition: A | Discharge status: Home / Self Care | Payer: Medicare Other | Attending: Cardiovascular Disease | Admitting: Cardiovascular Disease

## 2021-05-05 DIAGNOSIS — I340 Nonrheumatic mitral (valve) insufficiency: Secondary | ICD-10-CM | POA: Diagnosis not present

## 2021-05-05 DIAGNOSIS — Z853 Personal history of malignant neoplasm of breast: Secondary | ICD-10-CM | POA: Insufficient documentation

## 2021-05-05 DIAGNOSIS — Z9221 Personal history of antineoplastic chemotherapy: Secondary | ICD-10-CM | POA: Diagnosis not present

## 2021-05-05 DIAGNOSIS — R0609 Other forms of dyspnea: Secondary | ICD-10-CM | POA: Diagnosis not present

## 2021-05-05 HISTORY — PX: RIGHT/LEFT HEART CATH AND CORONARY ANGIOGRAPHY: CATH118266

## 2021-05-05 LAB — POCT I-STAT EG7
Acid-Base Excess: 0 mmol/L (ref 0.0–2.0)
Bicarbonate: 25.3 mmol/L (ref 20.0–28.0)
Calcium, Ion: 1.22 mmol/L (ref 1.15–1.40)
HCT: 29 % — ABNORMAL LOW (ref 36.0–46.0)
Hemoglobin: 9.9 g/dL — ABNORMAL LOW (ref 12.0–15.0)
O2 Saturation: 73 %
Potassium: 3.8 mmol/L (ref 3.5–5.1)
Sodium: 134 mmol/L — ABNORMAL LOW (ref 135–145)
TCO2: 27 mmol/L (ref 22–32)
pCO2, Ven: 44.3 mmHg (ref 44.0–60.0)
pH, Ven: 7.365 (ref 7.250–7.430)
pO2, Ven: 40 mmHg (ref 32.0–45.0)

## 2021-05-05 LAB — POCT I-STAT 7, (LYTES, BLD GAS, ICA,H+H)
Acid-base deficit: 1 mmol/L (ref 0.0–2.0)
Bicarbonate: 24 mmol/L (ref 20.0–28.0)
Calcium, Ion: 1.25 mmol/L (ref 1.15–1.40)
HCT: 29 % — ABNORMAL LOW (ref 36.0–46.0)
Hemoglobin: 9.9 g/dL — ABNORMAL LOW (ref 12.0–15.0)
O2 Saturation: 94 %
Potassium: 3.9 mmol/L (ref 3.5–5.1)
Sodium: 133 mmol/L — ABNORMAL LOW (ref 135–145)
TCO2: 25 mmol/L (ref 22–32)
pCO2 arterial: 41.1 mmHg (ref 32.0–48.0)
pH, Arterial: 7.373 (ref 7.350–7.450)
pO2, Arterial: 75 mmHg — ABNORMAL LOW (ref 83.0–108.0)

## 2021-05-05 SURGERY — RIGHT/LEFT HEART CATH AND CORONARY ANGIOGRAPHY
Anesthesia: LOCAL

## 2021-05-05 MED ORDER — LABETALOL HCL 5 MG/ML IV SOLN: 10.0000 mg | INTRAVENOUS | Status: DC | PRN

## 2021-05-05 MED ORDER — SODIUM CHLORIDE 0.9 % IV SOLN: 250.0000 mL | INTRAVENOUS | Status: DC | PRN

## 2021-05-05 MED ORDER — SODIUM CHLORIDE 0.9% FLUSH: 3.0000 mL | INTRAVENOUS | Status: DC | PRN

## 2021-05-05 MED ORDER — SODIUM CHLORIDE 0.9 % WEIGHT BASED INFUSION: 1.0000 mL/kg/h | INTRAVENOUS | Status: DC

## 2021-05-05 MED ORDER — SODIUM CHLORIDE 0.9% FLUSH: 3.0000 mL | Freq: Two times a day (BID) | INTRAVENOUS | Status: DC

## 2021-05-05 MED ORDER — SODIUM CHLORIDE 0.9 % WEIGHT BASED INFUSION
1.0000 mL/kg/h | INTRAVENOUS | Status: DC
  Administered 2021-05-05: 1 mL/kg/h via INTRAVENOUS

## 2021-05-05 MED ORDER — MIDAZOLAM HCL 2 MG/2ML IJ SOLN
INTRAMUSCULAR | Status: DC | PRN
  Administered 2021-05-05 (×2): 1 mg via INTRAVENOUS

## 2021-05-05 MED ORDER — HEPARIN (PORCINE) IN NACL 2000-0.9 UNIT/L-% IV SOLN
INTRAVENOUS | Status: DC | PRN
  Administered 2021-05-05 (×2): 1000 mL

## 2021-05-05 MED ORDER — ASPIRIN 81 MG PO CHEW: 81.0000 mg | CHEWABLE_TABLET | ORAL | Status: DC

## 2021-05-05 MED ORDER — SODIUM CHLORIDE 0.9 % WEIGHT BASED INFUSION: 3.0000 mL/kg/h | INTRAVENOUS | Status: AC

## 2021-05-05 MED ORDER — SODIUM CHLORIDE 0.9 % WEIGHT BASED INFUSION: 3.0000 mL/kg/h | INTRAVENOUS | Status: DC

## 2021-05-05 MED ORDER — ONDANSETRON HCL 4 MG/2ML IJ SOLN: 4.0000 mg | Freq: Four times a day (QID) | INTRAMUSCULAR | Status: DC | PRN

## 2021-05-05 MED ORDER — FENTANYL CITRATE (PF) 100 MCG/2ML IJ SOLN
INTRAMUSCULAR | Status: DC | PRN
  Administered 2021-05-05 (×2): 25 ug via INTRAVENOUS

## 2021-05-05 MED ORDER — LIDOCAINE HCL (PF) 1 % IJ SOLN
INTRAMUSCULAR | Status: DC | PRN
  Administered 2021-05-05: 10 mL via INTRADERMAL

## 2021-05-05 MED ORDER — IOHEXOL 350 MG/ML SOLN
INTRAVENOUS | Status: DC | PRN
  Administered 2021-05-05: 40 mL via INTRA_ARTERIAL

## 2021-05-05 MED ORDER — FENTANYL CITRATE (PF) 100 MCG/2ML IJ SOLN
INTRAMUSCULAR | Status: AC
  Filled 2021-05-05: qty 2

## 2021-05-05 MED ORDER — LIDOCAINE HCL (PF) 1 % IJ SOLN
INTRAMUSCULAR | Status: AC
  Filled 2021-05-05: qty 30

## 2021-05-05 MED ORDER — ACETAMINOPHEN 325 MG PO TABS: 650.0000 mg | ORAL_TABLET | ORAL | Status: DC | PRN

## 2021-05-05 MED ORDER — HYDRALAZINE HCL 20 MG/ML IJ SOLN: 10.0000 mg | INTRAMUSCULAR | Status: DC | PRN

## 2021-05-05 MED ORDER — HEPARIN (PORCINE) IN NACL 1000-0.9 UT/500ML-% IV SOLN
INTRAVENOUS | Status: AC
  Filled 2021-05-05: qty 1000

## 2021-05-05 MED ORDER — MIDAZOLAM HCL 2 MG/2ML IJ SOLN
INTRAMUSCULAR | Status: AC
  Filled 2021-05-05: qty 2

## 2021-05-05 SURGICAL SUPPLY — 13 items
CATH INFINITI 5 FR JL3.5 (CATHETERS) ×1 IMPLANT
CATH INFINITI JR4 5F (CATHETERS) ×1 IMPLANT
CATH SWAN GANZ 7F STRAIGHT (CATHETERS) ×1 IMPLANT
KIT HEART LEFT (KITS) ×2 IMPLANT
PACK CARDIAC CATHETERIZATION (CUSTOM PROCEDURE TRAY) ×2 IMPLANT
SHEATH PINNACLE 5F 10CM (SHEATH) ×1 IMPLANT
SHEATH PINNACLE 7F 10CM (SHEATH) ×1 IMPLANT
SHEATH PROBE COVER 6X72 (BAG) ×1 IMPLANT
SYR MEDRAD MARK 7 150ML (SYRINGE) ×2 IMPLANT
TRANSDUCER W/STOPCOCK (MISCELLANEOUS) ×2 IMPLANT
TUBING CIL FLEX 10 FLL-RA (TUBING) ×2 IMPLANT
WIRE EMERALD 3MM-J .035X150CM (WIRE) ×1 IMPLANT
WIRE MICRO SET SILHO 5FR 7 (SHEATH) ×3 IMPLANT

## 2021-05-05 NOTE — Interval H&P Note (Signed)
History and Physical Interval Note:  05/05/2021 7:33 AM  Helen Davis  has presented today for surgery, with the diagnosis of mr.  The various methods of treatment have been discussed with the patient and family. After consideration of risks, benefits and other options for treatment, the patient has consented to  Procedure(s): RIGHT/LEFT HEART CATH AND CORONARY ANGIOGRAPHY (N/A) as a surgical intervention.  The patient's history has been reviewed, patient examined, no change in status, stable for surgery.  I have reviewed the patient's chart and labs.  Questions were answered to the patient's satisfaction.     Sherren Mocha

## 2021-05-05 NOTE — Progress Notes (Signed)
Removal of right arterial and venous sheath without complication. Distal pulses present. Manual compression applied for 25 minutes. Sterile dressing applied to puncture site. No hematoma, oozing or bruising present. Patient understands recovery process and limitations

## 2021-05-28 DIAGNOSIS — R001 Bradycardia, unspecified: Secondary | ICD-10-CM | POA: Diagnosis not present

## 2021-05-28 DIAGNOSIS — R918 Other nonspecific abnormal finding of lung field: Secondary | ICD-10-CM | POA: Diagnosis not present

## 2021-05-28 DIAGNOSIS — I700 Atherosclerosis of aorta: Secondary | ICD-10-CM | POA: Diagnosis not present

## 2021-05-28 DIAGNOSIS — J9811 Atelectasis: Secondary | ICD-10-CM | POA: Diagnosis not present

## 2021-05-28 DIAGNOSIS — I3481 Nonrheumatic mitral (valve) annulus calcification: Secondary | ICD-10-CM | POA: Diagnosis not present

## 2021-05-28 DIAGNOSIS — Z887 Allergy status to serum and vaccine status: Secondary | ICD-10-CM | POA: Diagnosis not present

## 2021-05-28 DIAGNOSIS — Z853 Personal history of malignant neoplasm of breast: Secondary | ICD-10-CM | POA: Diagnosis not present

## 2021-05-28 DIAGNOSIS — D696 Thrombocytopenia, unspecified: Secondary | ICD-10-CM | POA: Diagnosis not present

## 2021-05-28 DIAGNOSIS — I340 Nonrheumatic mitral (valve) insufficiency: Secondary | ICD-10-CM | POA: Diagnosis not present

## 2021-05-28 DIAGNOSIS — Z87891 Personal history of nicotine dependence: Secondary | ICD-10-CM | POA: Diagnosis not present

## 2021-05-28 DIAGNOSIS — D688 Other specified coagulation defects: Secondary | ICD-10-CM | POA: Diagnosis not present

## 2021-05-28 DIAGNOSIS — J439 Emphysema, unspecified: Secondary | ICD-10-CM | POA: Diagnosis not present

## 2021-05-28 DIAGNOSIS — C50919 Malignant neoplasm of unspecified site of unspecified female breast: Secondary | ICD-10-CM | POA: Insufficient documentation

## 2021-05-28 DIAGNOSIS — R911 Solitary pulmonary nodule: Secondary | ICD-10-CM | POA: Diagnosis not present

## 2021-05-28 DIAGNOSIS — E559 Vitamin D deficiency, unspecified: Secondary | ICD-10-CM | POA: Diagnosis not present

## 2021-05-28 DIAGNOSIS — M069 Rheumatoid arthritis, unspecified: Secondary | ICD-10-CM | POA: Diagnosis not present

## 2021-05-28 DIAGNOSIS — J90 Pleural effusion, not elsewhere classified: Secondary | ICD-10-CM | POA: Diagnosis not present

## 2021-05-28 DIAGNOSIS — Z20822 Contact with and (suspected) exposure to covid-19: Secondary | ICD-10-CM | POA: Diagnosis not present

## 2021-05-28 DIAGNOSIS — Z9013 Acquired absence of bilateral breasts and nipples: Secondary | ICD-10-CM | POA: Diagnosis not present

## 2021-05-28 DIAGNOSIS — I3489 Other nonrheumatic mitral valve disorders: Secondary | ICD-10-CM | POA: Diagnosis not present

## 2021-05-28 DIAGNOSIS — J449 Chronic obstructive pulmonary disease, unspecified: Secondary | ICD-10-CM | POA: Diagnosis not present

## 2021-05-28 DIAGNOSIS — I341 Nonrheumatic mitral (valve) prolapse: Secondary | ICD-10-CM | POA: Diagnosis not present

## 2021-05-28 DIAGNOSIS — Z9221 Personal history of antineoplastic chemotherapy: Secondary | ICD-10-CM | POA: Diagnosis not present

## 2021-05-28 DIAGNOSIS — Z452 Encounter for adjustment and management of vascular access device: Secondary | ICD-10-CM | POA: Diagnosis not present

## 2021-05-28 DIAGNOSIS — J939 Pneumothorax, unspecified: Secondary | ICD-10-CM | POA: Diagnosis not present

## 2021-05-28 DIAGNOSIS — I059 Rheumatic mitral valve disease, unspecified: Secondary | ICD-10-CM | POA: Diagnosis not present

## 2021-05-28 DIAGNOSIS — Z4682 Encounter for fitting and adjustment of non-vascular catheter: Secondary | ICD-10-CM | POA: Diagnosis not present

## 2021-05-28 DIAGNOSIS — Z952 Presence of prosthetic heart valve: Secondary | ICD-10-CM | POA: Diagnosis not present

## 2021-05-28 DIAGNOSIS — I4891 Unspecified atrial fibrillation: Secondary | ICD-10-CM | POA: Diagnosis not present

## 2021-05-28 DIAGNOSIS — J811 Chronic pulmonary edema: Secondary | ICD-10-CM | POA: Diagnosis not present

## 2021-05-28 DIAGNOSIS — I517 Cardiomegaly: Secondary | ICD-10-CM | POA: Diagnosis not present

## 2021-05-28 DIAGNOSIS — M47814 Spondylosis without myelopathy or radiculopathy, thoracic region: Secondary | ICD-10-CM | POA: Diagnosis not present

## 2021-05-29 HISTORY — PX: MITRAL VALVE REPLACEMENT: SHX147

## 2021-05-30 DIAGNOSIS — D688 Other specified coagulation defects: Secondary | ICD-10-CM | POA: Insufficient documentation

## 2021-05-30 DIAGNOSIS — G8918 Other acute postprocedural pain: Secondary | ICD-10-CM | POA: Insufficient documentation

## 2021-05-30 DIAGNOSIS — D696 Thrombocytopenia, unspecified: Secondary | ICD-10-CM | POA: Insufficient documentation

## 2021-05-30 DIAGNOSIS — Z952 Presence of prosthetic heart valve: Secondary | ICD-10-CM | POA: Insufficient documentation

## 2021-05-30 DIAGNOSIS — I498 Other specified cardiac arrhythmias: Secondary | ICD-10-CM | POA: Insufficient documentation

## 2021-06-19 ENCOUNTER — Telehealth: Payer: Self-pay | Admitting: Cardiology

## 2021-06-19 DIAGNOSIS — I340 Nonrheumatic mitral (valve) insufficiency: Secondary | ICD-10-CM | POA: Diagnosis not present

## 2021-06-19 DIAGNOSIS — Z4889 Encounter for other specified surgical aftercare: Secondary | ICD-10-CM | POA: Diagnosis not present

## 2021-06-19 DIAGNOSIS — R9431 Abnormal electrocardiogram [ECG] [EKG]: Secondary | ICD-10-CM | POA: Diagnosis not present

## 2021-06-19 DIAGNOSIS — Z953 Presence of xenogenic heart valve: Secondary | ICD-10-CM | POA: Diagnosis not present

## 2021-06-19 DIAGNOSIS — Z952 Presence of prosthetic heart valve: Secondary | ICD-10-CM | POA: Diagnosis not present

## 2021-06-19 DIAGNOSIS — Z9049 Acquired absence of other specified parts of digestive tract: Secondary | ICD-10-CM | POA: Diagnosis not present

## 2021-06-19 DIAGNOSIS — I517 Cardiomegaly: Secondary | ICD-10-CM | POA: Diagnosis not present

## 2021-06-19 DIAGNOSIS — R918 Other nonspecific abnormal finding of lung field: Secondary | ICD-10-CM | POA: Diagnosis not present

## 2021-06-19 DIAGNOSIS — Z9013 Acquired absence of bilateral breasts and nipples: Secondary | ICD-10-CM | POA: Diagnosis not present

## 2021-06-19 DIAGNOSIS — Z79899 Other long term (current) drug therapy: Secondary | ICD-10-CM | POA: Diagnosis not present

## 2021-06-19 DIAGNOSIS — I4891 Unspecified atrial fibrillation: Secondary | ICD-10-CM | POA: Diagnosis not present

## 2021-06-19 DIAGNOSIS — Z7982 Long term (current) use of aspirin: Secondary | ICD-10-CM | POA: Diagnosis not present

## 2021-06-19 DIAGNOSIS — J90 Pleural effusion, not elsewhere classified: Secondary | ICD-10-CM | POA: Diagnosis not present

## 2021-06-19 NOTE — Telephone Encounter (Signed)
Spoke with Helen Davis, Dr. Aundra Millet PA at [facility] Noted that she had a post MVR junctional rhythm, beta blocker held during admission. Seen at [facility] for post op follow up, found to have new atrial fibrillation with RVR at 122 bpm. They would like to start apixaban and low dose beta blocker today, which I agree with. We will get her seen in our clinic this week to follow up rate and likely set her up for cardioversion.  [name], MD, PhD, [facility] at [facility] at [facility] [address], [place], [place] [id number] [phone]

## 2021-06-19 NOTE — Telephone Encounter (Signed)
-  Appointment scheduled for 06/22/21. -Pt made aware

## 2021-06-19 NOTE — Telephone Encounter (Signed)
Richard a PA to the surgeon of the Dr. who performed the patient's procedure with [facility] is calling requesting to speak with Dr. Harrell Gave in regards to an EKG change today. He is wanting to discuss a plan of treatment. He left his cell number for callback. Please advise.

## 2021-06-19 NOTE — Telephone Encounter (Signed)
Update

## 2021-06-22 ENCOUNTER — Ambulatory Visit (HOSPITAL_BASED_OUTPATIENT_CLINIC_OR_DEPARTMENT_OTHER): Payer: Medicare Other | Admitting: Cardiology

## 2021-06-22 ENCOUNTER — Other Ambulatory Visit: Payer: Self-pay

## 2021-06-22 ENCOUNTER — Encounter (HOSPITAL_BASED_OUTPATIENT_CLINIC_OR_DEPARTMENT_OTHER): Payer: Self-pay | Admitting: Cardiology

## 2021-06-22 VITALS — BP 90/70 | HR 113 | Ht 64.0 in | Wt 99.1 lb

## 2021-06-22 DIAGNOSIS — Z853 Personal history of malignant neoplasm of breast: Secondary | ICD-10-CM | POA: Diagnosis not present

## 2021-06-22 DIAGNOSIS — R911 Solitary pulmonary nodule: Secondary | ICD-10-CM | POA: Diagnosis not present

## 2021-06-22 DIAGNOSIS — Z952 Presence of prosthetic heart valve: Secondary | ICD-10-CM

## 2021-06-22 DIAGNOSIS — I4819 Other persistent atrial fibrillation: Secondary | ICD-10-CM | POA: Diagnosis not present

## 2021-06-22 MED ORDER — AMIODARONE HCL 200 MG PO TABS: ORAL_TABLET | ORAL | 0 refills | Status: DC

## 2021-06-22 NOTE — Progress Notes (Signed)
Cardiology Office Note:    Date:  06/22/2021   ID:  Helen Davis, DOB [birth year], MRN [id number]  PCP:  Unk Pinto, MD  Cardiologist:  [name], MD  Referring MD: Unk Pinto, MD   CC: follow up  History of Present Illness:    Helen Davis is a 81 y.o. female with a hx of mitral regurgitation s/p bioprosthetic MVR at [facility] 05/2021, remote breast cancer who is seen for follow up today. I initially met her 02/15/20 as a new consult at the request of Unk Pinto, MD for the evaluation and management of mitral regurgitation.  Today: She is s/p mitral valve replacement (55m Carpentier-Edwards Mitris bioprosthetic valve) [facility] Had heartport incision, healing well. Found to be in new afib at her follow up visit earlier this week. Did not feel it. Has not limited her, has walked up to 2 miles since being home.   We discussed cardioversion, TEE-cardioversion, and amiodarone today. See below. She wants to start cardiac rehab, but I would like her rates better controlled prior to this.  [facility] "A 2.4 x 1.6 cm subsolid spiculated nodule in the anterior right upper lobe. No tracheobronchial tree abnormalities. No pleural effusion." Referring to pulmonary nodule clinic today.  [place] and 4 mos of chemo initially but then had bilateral mastectomy when another mass found, underwent additional chemo. Remotely seen by Dr. MJana Hakim has been ~20 years since follow up.  Denies chest pain, shortness of breath at rest or with normal exertion. No PND, orthopnea, LE edema or unexpected weight gain. No syncope or palpitations.   Past Medical History:  Diagnosis Date   ABNORMAL EKG    Anemia    [place]ast [place]    Fatigue    Mitral valve disorders(424.0)    Vitamin D deficiency     Past Surgical History:  Procedure Laterality Date   APPENDECTOMY   1966   MASTECTOMY     RIGHT/LEFT HEART CATH AND CORONARY ANGIOGRAPHY N/A 05/05/2021   Procedure: RIGHT/LEFT HEART CATH AND CORONARY ANGIOGRAPHY;  Surgeon: CSherren Mocha MD;  [facility];  Service: Cardiovascular;  Laterality: N/A;   TEE WITHOUT CARDIOVERSION N/A 02/09/2021   Procedure: TRANSESOPHAGEAL ECHOCARDIOGRAM (TEE);  Surgeon: CSanda Klein MD;  Location: [facility];  Service: Cardiovascular;  Laterality: N/A;   VARICOSE VEIN SURGERY Right 1975    Current Medications: Current Outpatient Medications on File Prior to Visit  Medication Sig   apixaban (ELIQUIS) 5 MG TABS tablet Take 1 tablet by mouth 2 (two) times daily.   aspirin 81 MG chewable tablet Chew 1 tablet by mouth daily at 12 noon.   Bioflavonoid Products (ESTER-C) 500-550 MG TABS Take 500-1,000 mg by mouth daily.   Cholecalciferol (VITAMIN D PO) Take 4,000 Int'l Units by mouth daily.   Cyanocobalamin (VITAMIN B 12 PO) Take 1,000 mcg by mouth daily. Timed release   Ibuprofen 200 MG CAPS Take 400-600 mg by mouth daily as needed (arthritis). Advil   MAGNESIUM PO Take 500 mg by mouth at bedtime.   metoprolol tartrate (LOPRESSOR) 25 MG tablet Take 0.5 mg by mouth 2 (two) times daily.   Multiple Vitamins-Minerals (CENTRUM SILVER PO) Take 1 tablet by mouth daily. With iron   Zinc 30 MG TABS Take 30 mg by mouth daily.   No current facility-administered medications on file prior to visit.     Allergies:  Covid-19 mrna vacc (moderna)   Social History   Tobacco Use   Smoking status: Former    Types: Cigarettes    Quit date: 05/21/1972    Years since quitting: 49.1   Smokeless tobacco: Never  Substance Use Topics   Alcohol use: Yes    Alcohol/week: 3.0 standard drinks    Types: 3 Glasses of wine per week   Drug use: No    Family History: family history includes Atrial fibrillation in her mother; Cancer in an other family member; Dementia in her father; Emphysema in her father; Heart disease in her  sister; Hypertension in an other family member; Stroke in her mother and another family member.  ROS:   Please see the history of present illness.   Additional pertinent ROS otherwise unremarkable.  EKGs/Labs/Other Studies Reviewed:    The following studies were reviewed today: Cath 05/05/21 1.  Widely patent coronary arteries with minimal luminal irregularities and no significant stenoses, right dominant 2.  Calcified mitral annulus seen on plain fluoroscopy 3.  Normal right heart hemodynamics with mean pulmonary artery pressure 21, pulmonary capillary wedge V wave of 14, mean wedge pressure of 9, preserved cardiac output of 6 L/min by Fick calculation   Recommend: Patient in work-up for surgical mitral valve repair or replacement  Echo TEE 02/09/2021:  1. Left ventricular ejection fraction, by estimation, is 60 to 65%. The  left ventricle has normal function. The left ventricle has no regional  wall motion abnormalities. The left ventricular internal cavity size was  mildly dilated.   2. Right ventricular systolic function is normal. The right ventricular  size is normal. There is normal pulmonary artery systolic pressure.   3. Left atrial size was mild to moderately dilated. No left atrial/left  atrial appendage thrombus was detected.   4. The lateral half of the mitral posterior leaflet middle scallop (P2  towards the P1 subcommissure) has severe prolapse. The effective  regurgitant orifice area is 0.83 cm2, the regurgitant volume 166 ml. the  regurgitant fraction is 80%. The posterior  leaflet mobile segment length is 11 mm, after allowing for encroachment of mitral annular calcification into the base of the leaflet. The maximum  coaptation gap is 5 mm, the coaptation defect width is approximately 10  mm. The mitral valve is myxomatous.  Severe mitral valve regurgitation. No evidence of mitral stenosis. There  is severe holosystolic prolapse of the middle scallop of the posterior   leaflet of the mitral valve. The mean mitral valve gradient is 2.0 mmHg  with average heart rate of 66 bpm.   5. Tricuspid valve regurgitation is mild to moderate.   6. The aortic valve is tricuspid. Aortic valve regurgitation is mild. No  aortic stenosis is present.   Echo 01/19/2021: 1. Left ventricular ejection fraction, by estimation, is 65 to 70%. Left  ventricular ejection fraction by 3D volume is 67 %. The left ventricle has  normal function. The left ventricle has no regional wall motion  abnormalities. The left ventricular internal cavity size was moderately dilated. Left ventricular diastolic parameters are consistent with Grade III diastolic dysfunction (restrictive). Elevated left ventricular end-diastolic pressure.   2. Right ventricular systolic function is normal. The right ventricular  size is mildly enlarged. There is normal pulmonary artery systolic  pressure.   3. Left atrial size was severely dilated.   4. Right atrial size was moderately dilated.   5. 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 No evidence of  mitral stenosis. There  is moderate late systolic prolapse of the of the  posterior leaflet of the mitral valve. Moderate mitral annular  calcification. Severe mitral valve regurgitation with eccentric anteriorly directed jet that wraps around the entire left atrium.   6. The aortic valve is tricuspid. Aortic valve regurgitation is mild.  Mild aortic valve sclerosis is present, with no evidence of aortic valve stenosis. Aortic regurgitation PHT measures 703 msec. Aortic valve area, by VTI measures 1.79 cm. Aortic valve mean gradient measures 3.0 mmHg. Aortic valve Vmax measures 1.25 m/s.   7. There is mild dilatation of the aortic root, measuring 38 mm. There is mild dilatation of the ascending aorta, measuring 38 mm.   8. The inferior vena cava is normal in size with greater than 50%  respiratory variability, suggesting right atrial pressure of 3 mmHg.   9.  Additional Comments: There is a cystic structure in the liver  measuring 2.14cm x 2.72cm.  10. Recommend TEE for further evaluation of mitral regurgitation.  11. Compared to prior study, LV dimensions have increased with preserved LVF. Mitral regurgitation appears severe.   Echo 01/06/20  1. Left ventricular ejection fraction, by estimation, is 60 to 65%. The  left ventricle has normal function. The left ventricle has no regional  wall motion abnormalities. Left ventricular diastolic parameters are  consistent with Grade II diastolic dysfunction (pseudonormalization).   2. Right ventricular systolic function is normal. The right ventricular  size is normal. There is normal pulmonary artery systolic pressure.   3. Left atrial size was severely dilated.   4. Likely severe mitral regurgitation with multiple jets. Coanda effect  noted on the interatrial septum. No pumonary vein reverse flow noted, though only one PV is sampled. PISA was not obtained. Consider TEE for further evaluation if clinically  indicated. The mitral valve is normal in structure. Severe mitral valve  regurgitation. No evidence of mitral stenosis.   5. The aortic valve is tricuspid. Aortic valve regurgitation is mild. No  aortic stenosis is present.   6. The inferior vena cava is normal in size with greater than 50%  respiratory variability, suggesting right atrial pressure of 3 mmHg.   EKG:  EKG is personally reviewed.   06/22/21: atrial fibrillation with RVR at 113 bpm, LVH with repol 04/10/2021: not ordered today 01/30/2021: NSR at 70 bpm, LVH 02/15/20: sinus rhythm with PACs, LVH  Recent Labs: 01/26/2021: ALT 17; Magnesium 2.1; TSH 0.90 04/28/2021: BUN 9; Creatinine, Ser 0.73; Platelets 191 05/05/2021: Hemoglobin 9.9; Potassium 3.9; Sodium 133   Recent Lipid Panel    Component Value Date/Time   CHOL 222 (H) 01/26/2021 1433   TRIG 74 01/26/2021 1433   HDL 121 01/26/2021 1433   CHOLHDL 1.8 01/26/2021 1433   VLDL 15  12/17/2016 1432   LDLCALC 85 01/26/2021 1433    Physical Exam:    VS:  BP 90/70 (BP Location: Left Arm, Patient Position: Sitting, Cuff Size: Normal)    Pulse (!) 113    Ht 5' 4"  (1.626 m)    Wt 99 lb 1.6 oz (45 kg)    LMP  (LMP Unknown)    BMI 17.01 kg/m     Wt Readings from Last 3 Encounters:  06/22/21 99 lb 1.6 oz (45 kg)  05/05/21 96 lb (43.5 kg)  04/10/21 98 lb 6.4 oz (44.6 kg)   GEN: Well nourished, well developed in no acute distress HEENT: Normal, moist mucous membranes NECK: No JVD CARDIAC: irregularly irregular rhythm, normal S1 and S2, no rubs  or gallops. No murmur. VASCULAR: Radial and DP pulses 2+ bilaterally. No carotid bruits RESPIRATORY:  Clear to auscultation without rales, wheezing or rhonchi  ABDOMEN: Soft, non-tender, non-distended MUSCULOSKELETAL:  Ambulates independently SKIN: Warm and dry, no edema NEUROLOGIC:  Alert and oriented x 3. No focal neuro deficits noted. PSYCHIATRIC:  Normal affect    ASSESSMENT:    1. S/P MVR (mitral valve replacement)   2. Pulmonary nodule   3. Persistent atrial fibrillation ([place])   4. History of breast cancer     PLAN:    Severe mitral regurgitation S/P Bioprosthetic MVR (21m Carpentier-Edwards Mitris) [facility] Post[facility] atrial fibrillation with RVR -overall has done remarkably well post op -walking up to 2 miles/day -cannot feel that she is in afib -CHA2DS2/VAS Stroke Risk Points= 4  -continue apixaban for now -we discussed options for management of afib. Discussed TEE-CV, cardioversion in several weeks, or amiodarone. She would like to trial amiodarone, ordered today -would treat for 3 mos and then stop amiodarone to see if this was post op only -if still in afib or remains in RVR, would pursue cardioversion -needs either improved rate control or conversion to sinus rhythm prior to starting cardiac rehab -chronic low blood pressure limits uptitration of metoprolol    Cardiac  Rehabilitation Eligibility Assessment  The patient is NOT ready to start cardiac rehabilitation due to: Other (atrial fibrillation with rapid ventricular response)     Pulmonary nodule found on recent CT scan History of breast cancer s/p bilateral mastectomy and chemo -concern with spiculated nodule seen on CT. Referring to pulm nodule clinic. She denies any B symptoms -monitor for cardiomyopathy symptoms  Cardiac risk counseling and prevention recommendations: -recommend heart healthy/Mediterranean diet, with whole grains, fruits, vegetable, fish, lean meats, nuts, and olive oil. Limit salt. -recommend moderate walking, 3-5 times/week for 30-50 minutes each session. Aim for at least 150 minutes.week. Goal should be pace of 3 miles/hours, or walking 1.5 miles in 30 minutes -recommend avoidance of tobacco products. Avoid excess alcohol.   Plan for follow up: 6 mos or sooner Pending her decision for surgery.  Total time of encounter: 42 minutes total time of encounter, including 31 minutes spent in face-to-face patient care. This time includes coordination of care and counseling regarding atrial fibrillation. Remainder of non-face-to-face time involved reviewing chart documents/testing relevant to the patient encounter and documentation in the medical record.  BBuford Dresser MD, PhD, [facility]    Medication Adjustments/Labs and Tests Ordered: Current medicines are reviewed at length with the patient today.  Concerns regarding medicines are outlined above.   Orders Placed This Encounter  Procedures   AMB referral to cardiac rehabilitation   AMB  Referral to Pulmonary Nodule Clinic   EKG 12-Lead    Meds ordered this encounter  Medications   amiodarone (PACERONE) 200 MG tablet    Sig: Take 1 tablet (200 mg total) by mouth 2 (two) times daily for 14 days, THEN 1 tablet (200 mg total) daily.    Dispense:  104 tablet    Refill:  0    Patient  Instructions  Medication Instructions:  We will start amiodarone today. It will be 200 mg twice a day for two weeks, then 200 mg daily.  I would continue the metoprolol either until you are down to heart rates in the 60s-70s at rest or until you have switched to the once a day amiodarone, whichever comes first. If heart rates still  above 90 resting after two weeks, please let me know and we will plan to go forward with cardioversion.  Continue taking the eliquis.  *If you need a refill on your cardiac medications before your next appointment, please call your pharmacy*   Lab Work: None ordered today   Testing/Procedures: None ordered today   Follow-Up: At [facility], you and your health needs are our priority.  As part of our continuing mission to provide you with exceptional heart care, we have created designated Provider Care Teams.  These Care Teams include your primary Cardiologist (physician) and Advanced Practice Providers (APPs -  Physician Assistants and Nurse Practitioners) who all work together to provide you with the care you need, when you need it.  We recommend signing up for the patient portal called "MyChart".  Sign up information is provided on this After Visit Summary.  MyChart is used to connect with patients for Virtual Visits (Telemedicine).  Patients are able to view lab/test results, encounter notes, upcoming appointments, etc.  Non-urgent messages can be sent to your provider as well.   To learn more about what you can do with MyChart, go to [email or web address].    Your next appointment:   2-3 week(s)  The format for your next appointment:   In Person  Provider:   [name], MD    Other Instructions I have sent in referrals to cardiac rehab and the pulmonary nodule clinic. I would like your heart rate to be better controlled before starting cardiac rehab.   [name], MD, PhD, [facility]   [facility]     Signed, [name], MD PhD 06/22/2021  [place]

## 2021-06-22 NOTE — Patient Instructions (Signed)
Medication Instructions:  We will start amiodarone today. It will be 200 mg twice a day for two weeks, then 200 mg daily.  I would continue the metoprolol either until you are down to heart rates in the 60s-70s at rest or until you have switched to the once a day amiodarone, whichever comes first. If heart rates still above 90 resting after two weeks, please let me know and we will plan to go forward with cardioversion.  Continue taking the eliquis.  *If you need a refill on your cardiac medications before your next appointment, please call your pharmacy*   Lab Work: None ordered today   Testing/Procedures: None ordered today   Follow-Up: At [facility], you and your health needs are our priority.  As part of our continuing mission to provide you with exceptional heart care, we have created designated Provider Care Teams.  These Care Teams include your primary Cardiologist (physician) and Advanced Practice Providers (APPs -  Physician Assistants and Nurse Practitioners) who all work together to provide you with the care you need, when you need it.  We recommend signing up for the patient portal called "MyChart".  Sign up information is provided on this After Visit Summary.  MyChart is used to connect with patients for Virtual Visits (Telemedicine).  Patients are able to view lab/test results, encounter notes, upcoming appointments, etc.  Non-urgent messages can be sent to your provider as well.   To learn more about what you can do with MyChart, go to [email or web address].    Your next appointment:   2-3 week(s)  The format for your next appointment:   In Person  Provider:   [name], MD    Other Instructions I have sent in referrals to cardiac rehab and the pulmonary nodule clinic. I would like your heart rate to be better controlled before starting cardiac rehab.

## 2021-06-22 NOTE — Progress Notes (Signed)
Patient recovering well post operative. Once atrial fibrillation is either rate controlled (less than 90 bpm at rest) or she converts to sinus rhythm, she will be appropriate for cardiac rehab.

## 2021-07-10 ENCOUNTER — Encounter (HOSPITAL_BASED_OUTPATIENT_CLINIC_OR_DEPARTMENT_OTHER): Payer: Self-pay | Admitting: Family

## 2021-07-10 ENCOUNTER — Ambulatory Visit (HOSPITAL_BASED_OUTPATIENT_CLINIC_OR_DEPARTMENT_OTHER): Payer: Medicare Other | Admitting: Family

## 2021-07-10 ENCOUNTER — Other Ambulatory Visit: Payer: Self-pay

## 2021-07-10 VITALS — BP 90/50 | HR 78 | Ht 64.5 in | Wt 100.2 lb

## 2021-07-10 DIAGNOSIS — I480 Paroxysmal atrial fibrillation: Secondary | ICD-10-CM

## 2021-07-10 DIAGNOSIS — D6859 Other primary thrombophilia: Secondary | ICD-10-CM

## 2021-07-10 DIAGNOSIS — Z952 Presence of prosthetic heart valve: Secondary | ICD-10-CM | POA: Diagnosis not present

## 2021-07-10 MED ORDER — AMIODARONE HCL 200 MG PO TABS: 200.0000 mg | ORAL_TABLET | Freq: Every day | ORAL | 2 refills | Status: DC

## 2021-07-10 MED ORDER — APIXABAN 5 MG PO TABS: 5.0000 mg | ORAL_TABLET | Freq: Two times a day (BID) | ORAL | 3 refills | Status: DC

## 2021-07-10 MED ORDER — METOPROLOL TARTRATE 25 MG PO TABS: 12.5000 mg | ORAL_TABLET | Freq: Two times a day (BID) | ORAL | 3 refills | Status: DC

## 2021-07-10 NOTE — Patient Instructions (Signed)
Medication Instructions:  Your physician recommends that you continue on your current medications as directed. Please refer to the Current Medication list given to you today.  *If you need a refill on your cardiac medications before your next appointment, please call your pharmacy*   Lab Work: TODAY: CMET, CBC, TSH Your provider has recommended lab work. Please have this collected at [facility] at [place]. The lab is open 8:00 am - 4:30 pm. Please avoid 12:00p - 1:00p for lunch hour. You do not need an appointment. Please go to [address] [place], [place] [address]. This is in the Primary Care office on the 3rd floor, let them know you are there for blood work and they will direct you to the lab.  If you have labs (blood work) drawn today and your tests are completely normal, you will receive your results only by: [place] (if you have MyChart) OR A paper copy in the mail If you have any lab test that is abnormal or we need to change your treatment, we will call you to review the results.   Follow-Up: At [facility], you and your health needs are our priority.  As part of our continuing mission to provide you with exceptional heart care, we have created designated Provider Care Teams.  These Care Teams include your primary Cardiologist (physician) and Advanced Practice Providers (APPs -  Physician Assistants and Nurse Practitioners) who all work together to provide you with the care you need, when you need it.  We recommend signing up for the patient portal called "MyChart".  Sign up information is provided on this After Visit Summary.  MyChart is used to connect with patients for Virtual Visits (Telemedicine).  Patients are able to view lab/test results, encounter notes, upcoming appointments, etc.  Non-urgent messages can be sent to your provider as well.   To learn more about what you can do with MyChart, go to [email or web address].    Your next  appointment:   As scheduled

## 2021-07-10 NOTE — Progress Notes (Signed)
Office Visit    Patient Name: Helen Davis Date of Encounter: 07/10/2021  PCP:  Unk [place], [facility]  Cardiologist:  [name], MD  Advanced Practice Provider:  No care team member to display Electrophysiologist:  None      Chief Complaint    Helen Davis is a 81 y.o. female with a hx of mitral regurgitation s/p bioprosthetic MVR at [facility] 05/2018 3D, remote breast cancer, postoperative atrial fibrillation  presents today for follow up of atrial fibrillation   Past Medical History    Past Medical History:  Diagnosis Date   ABNORMAL EKG    Anemia    Breast cancer ([place])    Fatigue    Mitral valve disorders(424.0)    Vitamin D deficiency    Past Surgical History:  Procedure Laterality Date   APPENDECTOMY  1966   MASTECTOMY     RIGHT/LEFT HEART CATH AND CORONARY ANGIOGRAPHY N/A 05/05/2021   Procedure: RIGHT/LEFT HEART CATH AND CORONARY ANGIOGRAPHY;  Surgeon: Sherren Mocha, MD;  Location: [facility];  Service: Cardiovascular;  Laterality: N/A;   TEE WITHOUT CARDIOVERSION N/A 02/09/2021   Procedure: TRANSESOPHAGEAL ECHOCARDIOGRAM (TEE);  Surgeon: Sanda Klein, MD;  Location: [facility];  Service: Cardiovascular;  Laterality: N/A;   VARICOSE VEIN SURGERY Right 1975    Allergies  Allergies  Allergen Reactions   Covid-19 Mrna Vacc (Moderna) Rash    Patient states 1 week after moderna had severe blistering rash from left shoulder down to left elbow, will not get second shot    History of Present Illness    Helen Davis is a 81 y.o. female with a hx of  mitral regurgitation s/p bioprosthetic MVR at [facility] 05/2018 3D, remote breast cancer, postoperative atrial fibrillation last seen 06/22/21 by Dr. Harrell Gave.  Prior bilateral mastectomy for breast cancer in [place] in 1999 (lumpectomy and 4 months of chemotherapy initially but then bilateral mastectomy when another mass found with additional  chemotherapy).  She underwent mitral valve replacement with a 31 mm Carpentier-Edwards mitral bioprosthetic valve with Dr. Cheree Ditto at [facility] 05/29/21.  She was found to be in new onset atrial fibrillation at follow-up visit with surgeons office and was unaware of atrial fibrillation when seen by Dr. Harrell Gave 06/22/2021. Amiodarone was started.   She presents today for follow up. She is pleased to learn her EKG shows NSR. She anticipated as such as her HR at home as been in the 70s and 80s. Continues to stay active walking though notes she may have walked a bit too far yesterday (walked about 5 miles rather than her usual 3) and feels a bit tired today.  Reports no shortness of breath nor dyspnea on exertion. Reports no chest pain, pressure, or tightness. No edema, orthopnea, PND. Reports no palpitations.    EKGs/Labs/Other Studies Reviewed:   The following studies were reviewed today:  Cath 05/05/21 1.  Widely patent coronary arteries with minimal luminal irregularities and no significant stenoses, right dominant 2.  Calcified mitral annulus seen on plain fluoroscopy 3.  Normal right heart hemodynamics with mean pulmonary artery pressure 21, pulmonary capillary wedge V wave of 14, mean wedge pressure of 9, preserved cardiac output of 6 L/min by Fick calculation   Recommend: Patient in work-up for surgical mitral valve repair or replacement   Echo TEE 02/09/2021:  1. Left ventricular ejection fraction, by estimation, is 60 to 65%. The  left ventricle has normal function. The left ventricle has  no regional  wall motion abnormalities. The left ventricular internal cavity size was  mildly dilated.   2. Right ventricular systolic function is normal. The right ventricular  size is normal. There is normal pulmonary artery systolic pressure.   3. Left atrial size was mild to moderately dilated. No left atrial/left  atrial appendage thrombus was detected.   4. The lateral half of the mitral posterior  leaflet middle scallop (P2  towards the P1 subcommissure) has severe prolapse. The effective  regurgitant orifice area is 0.83 cm2, the regurgitant volume 166 ml. the  regurgitant fraction is 80%. The posterior  leaflet mobile segment length is 11 mm, after allowing for encroachment of mitral annular calcification into the base of the leaflet. The maximum  coaptation gap is 5 mm, the coaptation defect width is approximately 10  mm. The mitral valve is myxomatous.  Severe mitral valve regurgitation. No evidence of mitral stenosis. There  is severe holosystolic prolapse of the middle scallop of the posterior  leaflet of the mitral valve. The mean mitral valve gradient is 2.0 mmHg  with average heart rate of 66 bpm.   5. Tricuspid valve regurgitation is mild to moderate.   6. The aortic valve is tricuspid. Aortic valve regurgitation is mild. No  aortic stenosis is present.    Echo 01/19/2021: 1. Left ventricular ejection fraction, by estimation, is 65 to 70%. Left  ventricular ejection fraction by 3D volume is 67 %. The left ventricle has  normal function. The left ventricle has no regional wall motion  abnormalities. The left ventricular internal cavity size was moderately dilated. Left ventricular diastolic parameters are consistent with Grade III diastolic dysfunction (restrictive). Elevated left ventricular end-diastolic pressure.   2. Right ventricular systolic function is normal. The right ventricular  size is mildly enlarged. There is normal pulmonary artery systolic  pressure.   3. Left atrial size was severely dilated.   4. Right atrial size was moderately dilated.   5. 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 No evidence of  mitral stenosis. There is moderate late systolic prolapse of the of the  posterior leaflet of the mitral valve. Moderate mitral annular  calcification. Severe mitral valve regurgitation with eccentric anteriorly directed jet that wraps around the entire  left atrium.   6. The aortic valve is tricuspid. Aortic valve regurgitation is mild.  Mild aortic valve sclerosis is present, with no evidence of aortic valve stenosis. Aortic regurgitation PHT measures 703 msec. Aortic valve area, by VTI measures 1.79 cm. Aortic valve mean gradient measures 3.0 mmHg. Aortic valve Vmax measures 1.25 m/s.   7. There is mild dilatation of the aortic root, measuring 38 mm. There is mild dilatation of the ascending aorta, measuring 38 mm.   8. The inferior vena cava is normal in size with greater than 50%  respiratory variability, suggesting right atrial pressure of 3 mmHg.   9. Additional Comments: There is a cystic structure in the liver  measuring 2.14cm x 2.72cm.  10. Recommend TEE for further evaluation of mitral regurgitation.  11. Compared to prior study, LV dimensions have increased with preserved LVF. Mitral regurgitation appears severe.    Echo 01/06/20  1. Left ventricular ejection fraction, by estimation, is 60 to 65%. The  left ventricle has normal function. The left ventricle has no regional  wall motion abnormalities. Left ventricular diastolic parameters are  consistent with Grade II diastolic dysfunction (pseudonormalization).   2. Right ventricular systolic function is normal. The right ventricular  size  is normal. There is normal pulmonary artery systolic pressure.   3. Left atrial size was severely dilated.   4. Likely severe mitral regurgitation with multiple jets. Coanda effect  noted on the interatrial septum. No pumonary vein reverse flow noted, though only one PV is sampled. PISA was not obtained. Consider TEE for further evaluation if clinically  indicated. The mitral valve is normal in structure. Severe mitral valve  regurgitation. No evidence of mitral stenosis.   5. The aortic valve is tricuspid. Aortic valve regurgitation is mild. No  aortic stenosis is present.   6. The inferior vena cava is normal in size with greater than 50%   respiratory variability, suggesting right atrial pressure of 3 mmHg.   EKG:  EKG is ordered today.  The ekg ordered today demonstrates NSR 78 bpm with stable inferolateral TWI, LVH and no acute ST/T wave changes.   Recent Labs: 01/26/2021: ALT 17; Magnesium 2.1; TSH 0.90 04/28/2021: BUN 9; Creatinine, Ser 0.73; Platelets 191 05/05/2021: Hemoglobin 9.9; Potassium 3.9; Sodium 133  Recent Lipid Panel    Component Value Date/Time   CHOL 222 (H) 01/26/2021 1433   TRIG 74 01/26/2021 1433   HDL 121 01/26/2021 1433   CHOLHDL 1.8 01/26/2021 1433   VLDL 15 12/17/2016 1432   LDLCALC 85 01/26/2021 1433    Risk Assessment/Calculations:   CHA2DS2-VASc Score = 4   This indicates a 4.8% annual risk of stroke. The patient's score is based upon: CHF History: 1 HTN History: 0 Diabetes History: 0 Stroke History: 0 Vascular Disease History: 0 Age Score: 2 Gender Score: 1  Home Medications   Current Meds  Medication Sig   aspirin 81 MG chewable tablet Chew 1 tablet by mouth daily at 12 noon.   Bioflavonoid Products (ESTER-C) 500-550 MG TABS Take 500-1,000 mg by mouth daily.   Cholecalciferol (VITAMIN D PO) Take 4,000 Int'l Units by mouth daily.   Cyanocobalamin (VITAMIN B 12 PO) Take 1,000 mcg by mouth daily. Timed release   MAGNESIUM PO Take 500 mg by mouth at bedtime.   Multiple Vitamins-Minerals (CENTRUM SILVER PO) Take 1 tablet by mouth daily. With iron   Zinc 30 MG TABS Take 30 mg by mouth daily.   [DISCONTINUED] amiodarone (PACERONE) 200 MG tablet Take 200 mg by mouth daily.   [DISCONTINUED] apixaban (ELIQUIS) 5 MG TABS tablet Take 1 tablet by mouth 2 (two) times daily.   [DISCONTINUED] metoprolol tartrate (LOPRESSOR) 25 MG tablet Take 0.5 mg by mouth 2 (two) times daily.     Review of Systems     All other systems reviewed and are otherwise negative except as noted above.  Physical Exam    VS:  BP (!) 90/50 (BP Location: Left Arm, Patient Position: Sitting, Cuff Size: Small)     Pulse 78    Ht 5' 4.5" (1.638 m)    Wt 100 lb 3.2 oz (45.5 kg)    LMP  (LMP Unknown)    SpO2 99%    BMI 16.93 kg/m  , BMI Body mass index is 16.93 kg/m.  Wt Readings from Last 3 Encounters:  07/10/21 100 lb 3.2 oz (45.5 kg)  06/22/21 99 lb 1.6 oz (45 kg)  05/05/21 96 lb (43.5 kg)     GEN: Well nourished, well developed, in no acute distress. HEENT: normal. Neck: Supple, no JVD, carotid bruits, or masses. Cardiac: RRR, no murmurs, rubs, or gallops. No clubbing, cyanosis, edema.  Radials/PT 2+ and equal bilaterally.  Respiratory:  Respirations regular and unlabored,  clear to auscultation bilaterally. GI: Soft, nontender, nondistended. MS: No deformity or atrophy. Skin: Warm and dry, no rash. Neuro:  Strength and sensation are intact. Psych: Normal affect.  Assessment & Plan    Atrial fibrillation / Chronic anticoagulation / On Amiodarone therapy - EKG shows she has converted to NSR with Amiodarone. Plan to continue for 3 months time and consider discontinuation of Amiodarone at follow up as likely postoperative. Continue Metoprolol tartrate 12.5 mg BID. Refills provided. CHA2DS2-VASc Score = 4 [CHF History: 1, HTN History: 0, Diabetes History: 0, Stroke History: 0, Vascular Disease History: 0, Age Score: 2, Gender Score: 1].  Therefore, the patient's annual risk of stroke is 4.8 %.    Continue Eliquis 5mg  BID. CBC today for monitoring. Denies bleeding complications.  On Amiodarone therapy: TSH, CMET today. No signs of toxicity.  S/p MVR - s/p MVR by Dr. Cheree Ditto at [facility]. Now that NSR has been restored, appropriate for cardiac rehab.     Cardiac Rehabilitation Eligibility Assessment  The patient is ready to start cardiac rehabilitation from a cardiac standpoint. Other (atrial fibrillation with rapid ventricular response)   Disposition: Follow up in 3 month(s) with [name], MD or APP.  Signed, Loel Dubonnet, NP 07/10/2021, 2:27 PM [place]

## 2021-07-11 ENCOUNTER — Telehealth (HOSPITAL_BASED_OUTPATIENT_CLINIC_OR_DEPARTMENT_OTHER): Payer: Self-pay

## 2021-07-11 LAB — COMPREHENSIVE METABOLIC PANEL
ALT: 25 IU/L (ref 0–32)
AST: 19 IU/L (ref 0–40)
Albumin/Globulin Ratio: 2.2 (ref 1.2–2.2)
Albumin: 4.1 g/dL (ref 3.7–4.7)
Alkaline Phosphatase: 96 IU/L (ref 44–121)
BUN/Creatinine Ratio: 28 (ref 12–28)
BUN: 22 mg/dL (ref 8–27)
Bilirubin Total: 0.4 mg/dL (ref 0.0–1.2)
CO2: 24 mmol/L (ref 20–29)
Calcium: 9.3 mg/dL (ref 8.7–10.3)
Chloride: 100 mmol/L (ref 96–106)
Creatinine, Ser: 0.79 mg/dL (ref 0.57–1.00)
Globulin, Total: 1.9 g/dL (ref 1.5–4.5)
Glucose: 100 mg/dL — ABNORMAL HIGH (ref 70–99)
Potassium: 4.7 mmol/L (ref 3.5–5.2)
Sodium: 137 mmol/L (ref 134–144)
Total Protein: 6 g/dL (ref 6.0–8.5)
eGFR: 77 mL/min/{1.73_m2} (ref >59)

## 2021-07-11 LAB — CBC
Hematocrit: 35 % (ref 34.0–46.6)
Hemoglobin: 11.5 g/dL (ref 11.1–15.9)
MCH: 30.3 pg (ref 26.6–33.0)
MCHC: 32.9 g/dL (ref 31.5–35.7)
MCV: 92 fL (ref 79–97)
Platelets: 331 10*3/uL (ref 150–450)
RBC: 3.79 x10E6/uL (ref 3.77–5.28)
RDW: 12.1 % (ref 11.7–15.4)
WBC: 7 10*3/uL (ref 3.4–10.8)

## 2021-07-11 LAB — TSH: TSH: 3.29 u[IU]/mL (ref 0.450–4.500)

## 2021-07-11 NOTE — Telephone Encounter (Addendum)
Results called to patient who verbalizes understanding!     ----- Message from Loel Dubonnet, NP sent at 07/11/2021 10:14 AM EST ----- Normal liver, kidney, electrolytes, thyroid. CBC with no evidence of anemia nor infection.  Good result!

## 2021-07-24 ENCOUNTER — Encounter: Payer: Self-pay | Admitting: Nurse Practitioner

## 2021-07-24 ENCOUNTER — Other Ambulatory Visit: Payer: Self-pay

## 2021-07-24 ENCOUNTER — Ambulatory Visit (INDEPENDENT_AMBULATORY_CARE_PROVIDER_SITE_OTHER): Payer: Medicare Other | Admitting: Nurse Practitioner

## 2021-07-24 VITALS — BP 90/58 | HR 80 | Temp 97.5°F | Wt 99.4 lb

## 2021-07-24 DIAGNOSIS — J0100 Acute maxillary sinusitis, unspecified: Secondary | ICD-10-CM | POA: Diagnosis not present

## 2021-07-24 DIAGNOSIS — I340 Nonrheumatic mitral (valve) insufficiency: Secondary | ICD-10-CM

## 2021-07-24 MED ORDER — AMOXICILLIN 500 MG PO TABS: 500.0000 mg | ORAL_TABLET | Freq: Three times a day (TID) | ORAL | 0 refills | Status: DC

## 2021-07-24 NOTE — Patient Instructions (Signed)

## 2021-07-24 NOTE — Progress Notes (Signed)
Assessment and Plan: ? ?Nickey was seen today for acute visit. ? ?Diagnoses and all orders for this visit: ? ?Acute maxillary sinusitis, recurrence not specified ?-     amoxicillin (AMOXIL) 500 MG tablet; Take 1 tablet (500 mg total) by mouth 3 (three) times daily. 10 days ?Use Mucinex as needed ?Monitor symptoms and if not improved by Friday notify the office and would like pt to start on dexamethasone ?Use Humidifier in room at night ?Push fluids ?  ?Severe mitral regurgitation ?Surgery 05/2021 healing well, no murmur heard on exam today ?Monitor ? ? ?Further disposition pending results of labs. Discussed med's effects and SE's.   ?Over 30 minutes of exam, counseling, chart review, and critical decision making was performed.  ? ?Future Appointments  ?Date Time Provider [place]  ?07/24/2021  1:45 PM Lynnsie Linders, Townsend Roger, NP [facility] None  ?07/25/2021  3:00 PM Byrum, Rose Fillers, MD [facility] None  ?09/12/2021  2:30 PM Ebba Goll, Townsend Roger, NP [facility] None  ?09/19/2021  2:00 PM [name], MD [facility]  ?01/29/2022  2:00 PM Magda Bernheim, NP [facility] None  ? ? ?------------------------------------------------------------------------------------------------------------------ ? ? ?HPI ?Vitals:  ? 07/24/21 1345  ?BP: (!) 90/58  ?Pulse: 80  ?Temp: (!) 97.5 ?F (36.4 ?C)  ?SpO2: 98%  ?  ? ?81 y.o.female presents for Complaints of sore throat and left ear pain which has been present x 1 week. Nose has been bleeding almost every morning. Denies headaches,cough, myalgias, fever, nausea, vomiting and diarrhea ? 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 Cheree Ditto at [facility]. No longer having shortness of breath and healed without complications. ? ?BMI is Body mass index is 16.8 kg/m?., she has been working on diet and exercise. ?Wt Readings from Last 3 Encounters:  ?07/24/21 99 lb 6.4 oz (45.1 kg)  ?07/10/21 100 lb 3.2 oz (45.5 kg)  ?06/22/21 99 lb 1.6 oz (45 kg)  ? ? ? ?Past Medical History:   ?Diagnosis Date  ? ABNORMAL EKG   ? Anemia   ? Breast cancer ([place])   ? Fatigue   ? Mitral valve disorders(424.0)   ? Vitamin D deficiency   ?  ? ?Allergies  ?Allergen Reactions  ? Covid-19 Mrna Vacc (Moderna) Rash  ?  Patient states 1 week after moderna had severe blistering rash from left shoulder down to left elbow, will not get second shot  ? ? ?Current Outpatient Medications on File Prior to Visit  ?Medication Sig  ? amiodarone (PACERONE) 200 MG tablet Take 1 tablet (200 mg total) by mouth daily.  ? apixaban (ELIQUIS) 5 MG TABS tablet Take 1 tablet (5 mg total) by mouth 2 (two) times daily.  ? aspirin 81 MG chewable tablet Chew 1 tablet by mouth daily at 12 noon.  ? Bioflavonoid Products (ESTER-C) 500-550 MG TABS Take 500-1,000 mg by mouth daily.  ? Cholecalciferol (VITAMIN D PO) Take 4,000 Int'l Units by mouth daily.  ? Cyanocobalamin (VITAMIN B 12 PO) Take 1,000 mcg by mouth daily. Timed release  ? MAGNESIUM PO Take 500 mg by mouth at bedtime.  ? metoprolol tartrate (LOPRESSOR) 25 MG tablet Take 0.5 tablets (12.5 mg total) by mouth 2 (two) times daily.  ? Multiple Vitamins-Minerals (CENTRUM SILVER PO) Take 1 tablet by mouth daily. With iron  ? Zinc 30 MG TABS Take 30 mg by mouth daily.  ? ?No current facility-administered medications on file prior to visit.  ? ? ?ROS: all negative except above.  ? ?Physical  Exam: ? ?LMP  (LMP Unknown)  ? ?General Appearance: Well nourished, in no apparent distress. ?Eyes: PERRLA, EOMs, conjunctiva no swelling or erythema ?Sinuses: Positive maxillary tenderness ?ENT/Mouth: Ext aud canals clear, L TM dull with fluid.  R tm WNL. Mild erythema post pharynx no exudate  Tonsils not swollen or erythematous. Hearing normal.  ?Neck: Supple, thyroid normal.  ?Respiratory: Respiratory effort normal, BS equal bilaterally without rales, rhonchi, wheezing or stridor.  ?Cardio: RRR with no MRGs. Brisk peripheral pulses without edema.  ?Abdomen: Soft, + BS.  Non tender, no guarding, rebound,  hernias, masses. ?Lymphatics: Positive left cervical adenopathy ?Musculoskeletal: Full ROM, 5/5 strength, normal gait.  ?Skin: Warm, dry without rashes, lesions, ecchymosis.  ?Neuro: Cranial nerves intact. Normal muscle tone, no cerebellar symptoms. Sensation intact.  ?Psych: Awake and oriented X 3, normal affect, Insight and Judgment appropriate.  ?  ? ?Magda Bernheim, NP ?1:42 PM ?[facility] ? ?

## 2021-07-25 ENCOUNTER — Encounter: Payer: Medicare Other | Admitting: *Deleted

## 2021-07-25 ENCOUNTER — Encounter: Payer: Self-pay | Admitting: Emergency Medicine

## 2021-07-25 ENCOUNTER — Ambulatory Visit: Payer: Medicare Other | Admitting: Emergency Medicine

## 2021-07-25 DIAGNOSIS — R911 Solitary pulmonary nodule: Secondary | ICD-10-CM

## 2021-07-25 DIAGNOSIS — Z006 Encounter for examination for normal comparison and control in clinical research program: Secondary | ICD-10-CM

## 2021-07-25 NOTE — Addendum Note (Signed)
Addended by: Gavin Potters R on: 07/25/2021 03:45 PM ? ? Modules accepted: Orders ? ?

## 2021-07-25 NOTE — Progress Notes (Signed)
? ?Subjective:  ? ? Patient ID: Helen Davis, female    DOB: [birth year], 81 y.o.   MRN: [id number] ? 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 ?She is active, exercises several days a week. Functional capacity is improved since the valve surgery  ? ?CT chest 05/28/2021 ([facility]) showed an incidental 2.4 x 1.6 subsolid spiculated anterior right upper lobe pulmonary nodule. ? ? ?Review of Systems ?As per HPI ? ? ?Past Medical History:  ?Diagnosis Date  ? ABNORMAL EKG   ? Anemia   ? Breast cancer ([place])   ? Fatigue   ? Mitral valve disorders(424.0)   ? Vitamin D deficiency   ?  ? ?Family History  ?Problem Relation Age of Onset  ? Hypertension Other   ? Stroke Other   ? Cancer Other   ? Atrial fibrillation Mother   ? Stroke Mother   ? Emphysema Father   ? Dementia Father   ? Heart disease Sister   ?     CHF  ?  ?Sister > lung cancer ? ? ?Social History  ? ?Socioeconomic History  ? Marital status: Single  ?  Spouse name: Not on file  ? Number of children: Not on file  ? Years of education: Not on file  ? Highest education level: Not on file  ?Occupational History  ? Not on file  ?Tobacco Use  ? Smoking status: Former  ?  Types: Cigarettes  ?  Quit date: 05/21/1972  ?  Years since quitting: 49.2  ? Smokeless tobacco: Never  ?Substance and Sexual Activity  ? Alcohol use: Yes  ?  Alcohol/week: 3.0 standard drinks  ?  Types: 3 Glasses of wine per week  ? Drug use: No  ? Sexual activity: Not on file  ?Other Topics Concern  ? Not on file  ?Social History Narrative  ? Not on file  ? ?Social Determinants of Health  ? ?Financial Resource Strain: Not on file  ?Food Insecurity: Not on file  ?Transportation Needs: Not on file  ?Physical Activity: Not on file  ?Stress: Not on file  ?Social Connections: Not on file  ?Intimate Partner  Violence: Not on file  ?  ?From [place], has lived in the [place], in [place] ?No hx TB or pos PPD ?Was a Pharmacist, hospital, was a Education officer, museum.  ?2nd hand smoke exposure.  ? ?Allergies  ?Allergen Reactions  ? Covid-19 Mrna Vacc (Moderna) Rash  ?  Patient states 1 week after moderna had severe blistering rash from left shoulder down to left elbow, will not get second shot  ?  ? ?Outpatient Medications Prior to Visit  ?Medication Sig Dispense Refill  ? Acetaminophen (TYLENOL EXTRA STRENGTH PO) Take by mouth.    ? amiodarone (PACERONE) 200 MG tablet Take 1 tablet (200 mg total) by mouth daily. 30 tablet 2  ? amoxicillin (AMOXIL) 500 MG tablet Take 1 tablet (500 mg total) by mouth 3 (three) times daily. 10 days 30 tablet 0  ? apixaban (ELIQUIS) 5 MG TABS tablet Take 1 tablet (5 mg total) by mouth 2 (two) times daily. 60 tablet 3  ? aspirin 81 MG chewable tablet Chew 1 tablet by mouth daily at 12 noon.    ? Bioflavonoid Products (ESTER-C) 500-550 MG TABS Take 500-1,000 mg by mouth daily.    ? Cholecalciferol (VITAMIN D PO) Take 4,000  Int'l Units by mouth daily.    ? Cyanocobalamin (VITAMIN B 12 PO) Take 1,000 mcg by mouth daily. Timed release    ? MAGNESIUM PO Take 500 mg by mouth at bedtime.    ? metoprolol tartrate (LOPRESSOR) 25 MG tablet Take 0.5 tablets (12.5 mg total) by mouth 2 (two) times daily. 30 tablet 3  ? Multiple Vitamins-Minerals (CENTRUM SILVER PO) Take 1 tablet by mouth daily. With iron    ? Zinc 30 MG TABS Take 30 mg by mouth daily.    ? ?No facility-administered medications prior to visit.  ? ? ? ?   ?Objective:  ? Physical Exam ?Vitals:  ? 07/25/21 1502  ?BP: 106/68  ?Pulse: 86  ?Temp: 98.5 ?F (36.9 ?C)  ?TempSrc: Oral  ?SpO2: 100%  ?Weight: 99 lb 6.4 oz (45.1 kg)  ?Height: '5\' 4"'$  (1.626 m)  ? ?Gen: Pleasant, well-nourished, in no distress,  normal affect ? ?ENT: No lesions,  mouth clear,  oropharynx clear, no postnasal drip ? ?Neck: No JVD, no stridor ? ?Lungs: No use of accessory muscles, no crackles or wheezing on  normal respiration, no wheeze on forced expiration ? ?Cardiovascular: RRR, S1 and S2 click.  No murmur ? ?Musculoskeletal: No deformities, no cyanosis or clubbing ? ?Neuro: alert, awake, non focal ? ?Skin: Warm, no lesions or rash ? ? ?   ?Assessment & Plan:  ?Pulmonary nodule ?Isolated mixed density right upper lobe pulmonary nodule noted on a CT scan from [facility] when she was having her MVR.  At least moderate suspicion for possible adenocarcinoma.  We have decided to repeat her CT scan of the chest at the 59-monthmark to look for interval change.  Discussed the possible next steps for diagnosis including possible navigation bronchoscopy.  I did discuss the Veracyte nodule restratification trial with her.  She would like to hear more about the trial.  ? ?We reviewed your CT scan of the chest from [facility] today. ?We will plan to repeat a CT scan of the chest in July 2023 to compare with your prior. ?We discussed a clinical trial that can help with risk stratification for pulmonary nodules.  We will give you more information about this trial today. ?Follow Dr. BLamonte Sakaiin July after your CT so we can review the results together. ? ? ?RBaltazar Apo MD, PhD ?07/25/2021, 3:37 PM ?[facility] ?[phone] if no answer before 7:00PM call [phone] ?For any issues after 7:00PM please call eLink [phone]? ? ? ?

## 2021-07-25 NOTE — Assessment & Plan Note (Signed)
Isolated mixed density right upper lobe pulmonary nodule noted on a CT scan from [facility] when she was having her MVR.  At least moderate suspicion for possible adenocarcinoma.  We have decided to repeat her CT scan of the chest at the 34-monthmark to look for interval change.  Discussed the possible next steps for diagnosis including possible navigation bronchoscopy.  I did discuss the Veracyte nodule restratification trial with her.  She would like to hear more about the trial.  ? ?We reviewed your CT scan of the chest from [facility] today. ?We will plan to repeat a CT scan of the chest in July 2023 to compare with your prior. ?We discussed a clinical trial that can help with risk stratification for pulmonary nodules.  We will give you more information about this trial today. ?Follow Dr. BLamonte Sakaiin July after your CT so we can review the results together. ?

## 2021-07-25 NOTE — Patient Instructions (Signed)
We reviewed your CT scan of the chest from [facility] today. ?We will plan to repeat a CT scan of the chest in July 2023 to compare with your prior. ?We discussed a clinical trial that can help with risk stratification for pulmonary nodules.  We will give you more information about this trial today. ?Follow Dr. Lamonte Sakai in July after your CT so we can review the results together. ?

## 2021-07-26 ENCOUNTER — Other Ambulatory Visit: Payer: Self-pay

## 2021-07-26 NOTE — Research (Signed)
Title: NIGHTINGALE: CliNIcal Utility of ManaGement of Patients witH CT and LDCT Identified Pulmonary Nodules UsinG the Percepta NasAL Swab ClassifiEr -- with Familiarization ?  ?Protocol #: [id number] Sponsor: [employer] ?  ?Protocol Revision 1 dated 01Sep2022 and confirmed current on today's visit, IRB approved Revision 1 on 29Dec2022. ?  ?Objectives:  ?Primary: To evaluate if use of the Percepta Nasal Swab test in the diagnostic work up ?of newly identified pulmonary nodules reduces the number of invasive procedures in the ?group classified as low-risk by the test and that are benign as compared to a control ?group managed without a Percepta Nasal Swab test result. ?                  A newly identified nodule is defined as any nodule first identified on imaging ?                  <90 days prior to nasal sample collection that hasn?t undergone a diagnostic ?                   procedure for the management of their index nodule prior to enrollment. ?                  CT imaging includes conventional CT, LDCT, HRCT ?                  Benign diagnosis is defined as a specific diagnosis of a benign condition, ?                   radiographic resolution or stability at ? 24 months, or no cytological, ?                   radiological, or pathological evidence of cancer. ?                  Procedures will be categorized as either invasive or non-invasive in the Data ?                   Management Plan (DMP). ?Secondary: To evaluate if use of the Percepta Nasal Swab test in the diagnostic work ?up of newly identified pulmonary nodules increases the proportion of subjects classified ?as high-risk by the test and have primary lung cancer that go directly to appropriate ?therapy as compared to a control group managed without a Percepta Nasal Swab test ?result. ?                   Proportion of subjects that go directly to appropriate therapy is defined as those ?                    subjects that undergo surgery, ablative  or other appropriate therapy as the next ?                    step after the Percepta Nasal Swab test result without intervening non-surgical ?                    procedures ?                            a. Non-surgical procedures include diagnostic PET, but not PET for ?                                  staging purposes. ?                            b. Appropriate therapies will be defined in the CRF. ?                   A newly identified nodule is defined as any nodule first identified on imaging ?                   <90 days prior to nasal sample collection. ?                   Lung cancer diagnosis is defined as established by cytology or pathology, or in ?                    circumstances where a presumptive diagnosis of cancer led to definitive ?                    ablative or other appropriate therapy without pathology. ?Key Inclusion Criteria: ? Inclusion Criteria: ? Able to tolerate nasal epithelial specimen collection ? Signed written Informed Consent obtained ? Subject clinical history available for review by sponsor and regulatory agencies ? New nodule first identified on imaging < 90 days prior to nasal sample collection ?(index nodule) ? CT report available for index nodule ? 74 - 15 years of age ? Current or former smoker (>100 cigarettes in a lifetime) ? Pulmonary nodule ?30 mm detected by CT ? ?Key Exclusion Criteria: ?Exclusion Criteria ? Subject has undergone a diagnostic procedure for the management of their ?index nodule after the index CT and prior to enrollment ? Active cancer (other than non-melanoma skin cancer) ? Prior primary lung cancer (prior non-lung cancer acceptable) ? Prior participation in this study (i.e., subjects may not be enrolled more than ?once) ? Current active treatment with an investigational device or drug (patients in trial ?follow up period are okay if intervention phase is complete) ? Patient enrolled or planned to be enrolled in another clinical trial that may ?influence  management of the patient?s nodule ? Concurrent or planned use of tools or tests for assigning lung nodule risk of ?malignancy (e.g., genomic or proteomic blood tests) other than clinically ?validated risk calculators ? ?Clinical Research Coordinator / Research RN note : This visit is for enrollment / baseline Subject [id number] with DOB: [id number] on [id number] for the above protocol is an Enrollment Visit and is for purpose of research.  ?  ?Subject expressed interest and consent in continuing as a study subject. Subject confirmed contact information (e.g. address, telephone, email). Subject thanked for participation in research and contribution to science.   ?  ?During this visit on 01MAR2023  , the subject reviewed and signed the consent form, provided demographics, and had a nasal swab collected per the above referenced protocol. Please refer to the subject's paper source binder for further details.  ? ? ?The PI and Sub-I met/discussed the subject prior to consenting patient.  The sub-Investigator  Dr. Baltazar Apo was present for the consenting process.  ?     ?  ?Signed by ?Jaye Beagle RN CRN II ? ?

## 2021-08-01 ENCOUNTER — Encounter: Payer: Self-pay | Admitting: Nurse Practitioner

## 2021-08-04 ENCOUNTER — Encounter (HOSPITAL_COMMUNITY): Payer: Self-pay

## 2021-08-04 ENCOUNTER — Telehealth (HOSPITAL_COMMUNITY): Payer: Self-pay

## 2021-08-04 NOTE — Telephone Encounter (Signed)
Attempted to call patient in regards to Cardiac Rehab - LM on VM Mailed letter 

## 2021-08-04 NOTE — Telephone Encounter (Signed)
Pt insurance is active and benefits verified through [facility]. Co-pay $0.00, DED $0.00/$0.00 met, out of pocket $3,950.00/$1,816.29 met, co-insurance 0%. No pre-authorization required. Passport, 08/04/21 @ 2:13PM, JGM#[id number] ?  ?Will contact patient to see if she is interested in the Cardiac Rehab Program.  ?

## 2021-08-08 ENCOUNTER — Telehealth (HOSPITAL_COMMUNITY): Payer: Self-pay

## 2021-08-08 NOTE — Telephone Encounter (Signed)
Pt returned CR phone call and stated she is walking 5 miles a day right now and is not interested in CR at this time. ?  ?Closed referral ?

## 2021-09-04 DIAGNOSIS — M25531 Pain in right wrist: Secondary | ICD-10-CM | POA: Diagnosis not present

## 2021-09-04 DIAGNOSIS — Z853 Personal history of malignant neoplasm of breast: Secondary | ICD-10-CM | POA: Diagnosis not present

## 2021-09-04 DIAGNOSIS — M79644 Pain in right finger(s): Secondary | ICD-10-CM | POA: Insufficient documentation

## 2021-09-04 DIAGNOSIS — Z952 Presence of prosthetic heart valve: Secondary | ICD-10-CM | POA: Diagnosis not present

## 2021-09-05 ENCOUNTER — Encounter (HOSPITAL_BASED_OUTPATIENT_CLINIC_OR_DEPARTMENT_OTHER): Payer: Self-pay

## 2021-09-05 DIAGNOSIS — M13831 Other specified arthritis, right wrist: Secondary | ICD-10-CM | POA: Insufficient documentation

## 2021-09-05 NOTE — Telephone Encounter (Signed)
Please advise 

## 2021-09-11 NOTE — Progress Notes (Signed)
? MEDICARE WELLNESS AND 3 MONTH FOLLOW UP ? ?Assessment:  ?  ?ENCOUNTER FOR MEDICARE ANNUAL WELLNESS VISIT ?Declines cologuard or colonscopy, DEXA ? 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 Cheree Ditto at [facility]. ? ?Chronic obstructive pulmonary disease, unspecified COPD type ([place]) ? will get CXR- last one 2019, continue to avoid triggers, call if any change in symptoms, continue exercise- albuterol sent in ? ?Osteoporosis, unspecified osteoporosis type, unspecified pathological fracture presence ?Still need DEXA- last one 2011, DECLINES-continue vitamin D ? ?Anemia, unspecified type ?Currently on MVI not iron supplement ?-     CBC with Differential/Platelet ? ?Abnormal Glucose ?Continue diet and exercise ?-A1c ? ?History of breast cancer ?S/p mastectomy no need for MGM ? ?Mild malnutrition ([place]) ?Add ensure ?Monitor ? ?Mixed hyperlipidemia ?-continue medications, check lipids, decrease fatty foods, increase activity.  ?-     Lipid panel ?- CMP ? ?Vitamin D deficiency ?-     continue supplement ? ?Primary osteoarthritis involving multiple joints ?Continue walking ? ?Elevated BP without diagnosis of hypertension ?- continue medications, DASH diet, exercise and monitor at home. Call if greater than 130/80.  ? ?Medication management ?Continued ? ?Need for tetanus vaccination ?- Td given ? ?Plan:  ? ?During the course of the visit the patient was educated and counseled about appropriate screening and preventive services including:  ? ?Pneumococcal vaccine  ?Prevnar 13 ?Influenza vaccine ?Td vaccine ?Screening electrocardiogram ?Bone densitometry screening ?Colorectal cancer screening ?Diabetes screening ?Glaucoma screening ?Nutrition counseling  ?Advanced directives: requested  ? ?Over 40 minutes of exam, counseling, chart review and critical decision making was performed ?Future Appointments  ?Date Time Provider [place]  ?09/19/2021  2:00 PM [name], MD  [facility]  ?01/29/2022  2:00 PM Magda Bernheim, NP [facility] None  ?09/13/2022  2:00 PM Junie Engram, Townsend Roger, NP [facility] None  ? ? ? ? ?Subjective:  ?Helen Davis is a 81 y.o. female who presents wellness and 3 month follow up.  ? ? ?Her blood pressure has been controlled at home, today their BP is BP: 102/60  ?BP Readings from Last 3 Encounters:  ?09/12/21 102/60  ?07/25/21 106/68  ?07/24/21 (!) 90/58  ? ? ? 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 Cheree Ditto at [facility]. No longer having shortness of breath and healed without complications. ?  ? ?She does workout, walks 5 miles everyday that it is nice.  Her dog Chippy died a year ago. . She denies chest pain, shortness of breath, dizziness.  ?She has COPD, could not afford Symbicort, has albuterol and states with cooler weather it is better.  ?She is not on cholesterol medication and denies myalgias. Her cholesterol is at goal. The cholesterol last visit was:   ?Lab Results  ?Component Value Date  ? CHOL 222 (H) 01/26/2021  ? HDL 121 01/26/2021  ? [place] 85 01/26/2021  ? TRIG 74 01/26/2021  ? CHOLHDL 1.8 01/26/2021  ? ? Last A1C in the office was:  ?Lab Results  ?Component Value Date  ? HGBA1C 4.8 01/26/2021  ? ?Patient is on Vitamin D supplement.   ?Lab Results  ?Component Value Date  ? VD25OH 76 01/26/2021  ?   ?She is retired.   ?S/p double mastectomy. ?She has osteoporosis and has history of lumbar compression fracture.  ?BMI is Body mass index is 17.2 kg/m?., she is working on increasing her weight.  ?Wt Readings from Last 3 Encounters:  ?09/12/21 100  lb 3.2 oz (45.5 kg)  ?07/25/21 99 lb 6.4 oz (45.1 kg)  ?07/24/21 99 lb 6.4 oz (45.1 kg)  ? ? ? ?Medication Review: ?Current Outpatient Medications on File Prior to Visit  ?Medication Sig Dispense Refill  ? Acetaminophen (TYLENOL EXTRA STRENGTH PO) Take by mouth.    ? amiodarone (PACERONE) 200 MG tablet Take 1 tablet (200 mg total) by mouth daily. 30 tablet 2  ? apixaban (ELIQUIS) 5 MG TABS  tablet Take 1 tablet (5 mg total) by mouth 2 (two) times daily. 60 tablet 3  ? aspirin 81 MG chewable tablet Chew 1 tablet by mouth daily at 12 noon.    ? Bioflavonoid Products (ESTER-C) 500-550 MG TABS Take 500-1,000 mg by mouth daily.    ? Cholecalciferol (VITAMIN D PO) Take 4,000 Int'l Units by mouth daily.    ? Cyanocobalamin (VITAMIN B 12 PO) Take 1,000 mcg by mouth daily. Timed release    ? MAGNESIUM PO Take 500 mg by mouth at bedtime.    ? metoprolol tartrate (LOPRESSOR) 25 MG tablet Take 0.5 tablets (12.5 mg total) by mouth 2 (two) times daily. 30 tablet 3  ? Multiple Vitamins-Minerals (CENTRUM SILVER PO) Take 1 tablet by mouth daily. With iron    ? Zinc 30 MG TABS Take 30 mg by mouth daily.    ? amoxicillin (AMOXIL) 500 MG tablet Take 1 tablet (500 mg total) by mouth 3 (three) times daily. 10 days (Patient not taking: Reported on 09/12/2021) 30 tablet 0  ? ?No current facility-administered medications on file prior to visit.  ? ? ?Current Problems (verified) ?Patient Active Problem List  ? Diagnosis Date Noted  ? Pulmonary nodule 07/25/2021  ? Rupture of extensor tendon of hand, initial encounter 02/16/2021  ? Screening for ischemic heart disease 01/26/2021  ? Abnormal glucose 08/03/2020  ? Elevated BP without diagnosis of hypertension 08/03/2020  ? Unilateral primary osteoarthritis, right knee 05/13/2019  ? Varicose veins with ulcer, right ([place]) 02/16/2019  ? COPD (chronic obstructive pulmonary disease) ([place]) 12/09/2015  ? Mild malnutrition ([place]) 12/08/2015  ? DJD (degenerative joint disease) 07/28/2015  ? Mixed hyperlipidemia 11/15/2014  ? Osteoporosis 11/15/2014  ? Anemia   ? History of breast cancer   ? Vitamin D deficiency   ? Nonrheumatic mitral valve regurgitation 10/11/2009  ? ?Screening Tests ?Preventative care: ? ?Immunization History  ?Administered Date(s) Administered  ? Pneumococcal Conjugate-13 02/12/2018  ? Pneumococcal Polysaccharide-23 02/16/2019  ? Tdap 09/18/2009  ? ?Health Maintenance   ?Topic Date Due  ? TETANUS/TDAP  09/19/2019  ? COVID-19 Vaccine (1) 09/28/2021 (Originally 08/13/1941)  ? Zoster Vaccines- Shingrix (1 of 2) 12/12/2021 (Originally 02/12/1962)  ? Hepatitis C Screening  09/13/2022 (Originally 02/12/1959)  ? INFLUENZA VACCINE  12/19/2021  ? Pneumonia Vaccine 79+ Years old  Completed  ? DEXA SCAN  Completed  ? HPV VACCINES  Aged Out  ? ? ? ?Names of Other Physician/Practitioners you currently use: ?1. [facility] here for primary care ?2. Dr. Clydene Laming eye doctor 08/2020 ?3. Dr. Milford Cage, dentist 11/22 ?Patient Care Team: ?Magda Bernheim, NP as PCP - General (Nurse Practitioner) ?[name], MD as PCP - Cardiology (Cardiology) ?Darleen Crocker, MD as Consulting Physician (Ophthalmology) ?Richmond Campbell, MD as Consulting Physician (Gastroenterology) ?Druscilla Brownie, MD as Consulting Physician (Dermatology) ?Garald Balding, MD as Consulting Physician (Orthopedic Surgery) ?Unice Bailey, MD as Consulting Physician (Rheumatology) ?Victorino Dike, DDS as Consulting Physician (Dentistry) ?Larey Dresser, MD as Consulting Physician (Cardiology) ? ?Allergies ?Allergies  ?  Allergen Reactions  ? Covid-19 Mrna Vacc (Moderna) Rash  ?  Patient states 1 week after moderna had severe blistering rash from left shoulder down to left elbow, will not get second shot  ? ? ?SURGICAL HISTORY ?She  has a past surgical history that includes Mastectomy; Appendectomy (1966); Varicose vein surgery (Right, 1975); TEE without cardioversion (N/A, 02/09/2021); RIGHT/LEFT HEART CATH AND CORONARY ANGIOGRAPHY (N/A, 05/05/2021); and Mitral valve replacement (05/29/2021). ?FAMILY HISTORY ?Her family history includes Atrial fibrillation in her mother; Cancer in an other family member; Dementia in her father; Emphysema in her father; Heart disease in her sister; Hypertension in an other family member; Stroke in her mother and another family member. ?SOCIAL HISTORY ?She   reports that she quit smoking about 49 years ago. Her smoking use included cigarettes. She has never used smokeless tobacco. She reports current alcohol use of about 3.0 standard drinks per week. She reports that

## 2021-09-12 ENCOUNTER — Ambulatory Visit (INDEPENDENT_AMBULATORY_CARE_PROVIDER_SITE_OTHER): Payer: Medicare Other | Admitting: Nurse Practitioner

## 2021-09-12 ENCOUNTER — Ambulatory Visit (HOSPITAL_COMMUNITY): Payer: Medicare Other

## 2021-09-12 ENCOUNTER — Encounter: Payer: Self-pay | Admitting: Nurse Practitioner

## 2021-09-12 VITALS — BP 102/60 | HR 76 | Temp 97.9°F | Wt 100.2 lb

## 2021-09-12 DIAGNOSIS — E441 Mild protein-calorie malnutrition: Secondary | ICD-10-CM | POA: Diagnosis not present

## 2021-09-12 DIAGNOSIS — I340 Nonrheumatic mitral (valve) insufficiency: Secondary | ICD-10-CM | POA: Diagnosis not present

## 2021-09-12 DIAGNOSIS — R7309 Other abnormal glucose: Secondary | ICD-10-CM | POA: Diagnosis not present

## 2021-09-12 DIAGNOSIS — E782 Mixed hyperlipidemia: Secondary | ICD-10-CM

## 2021-09-12 DIAGNOSIS — Z23 Encounter for immunization: Secondary | ICD-10-CM | POA: Diagnosis not present

## 2021-09-12 DIAGNOSIS — R6889 Other general symptoms and signs: Secondary | ICD-10-CM | POA: Diagnosis not present

## 2021-09-12 DIAGNOSIS — Z0001 Encounter for general adult medical examination with abnormal findings: Secondary | ICD-10-CM

## 2021-09-12 DIAGNOSIS — M810 Age-related osteoporosis without current pathological fracture: Secondary | ICD-10-CM | POA: Diagnosis not present

## 2021-09-12 DIAGNOSIS — M159 Polyosteoarthritis, unspecified: Secondary | ICD-10-CM

## 2021-09-12 DIAGNOSIS — J449 Chronic obstructive pulmonary disease, unspecified: Secondary | ICD-10-CM

## 2021-09-12 DIAGNOSIS — D649 Anemia, unspecified: Secondary | ICD-10-CM

## 2021-09-12 DIAGNOSIS — Z79899 Other long term (current) drug therapy: Secondary | ICD-10-CM

## 2021-09-12 DIAGNOSIS — Z853 Personal history of malignant neoplasm of breast: Secondary | ICD-10-CM

## 2021-09-12 DIAGNOSIS — R030 Elevated blood-pressure reading, without diagnosis of hypertension: Secondary | ICD-10-CM

## 2021-09-12 DIAGNOSIS — E559 Vitamin D deficiency, unspecified: Secondary | ICD-10-CM

## 2021-09-13 ENCOUNTER — Other Ambulatory Visit: Payer: Self-pay | Admitting: Nurse Practitioner

## 2021-09-13 DIAGNOSIS — E871 Hypo-osmolality and hyponatremia: Secondary | ICD-10-CM

## 2021-09-13 LAB — CBC WITH DIFFERENTIAL/PLATELET
Absolute Monocytes: 617 cells/uL (ref 200–950)
Basophils Absolute: 92 cells/uL (ref 0–200)
Basophils Relative: 1.8 %
Eosinophils Absolute: 82 cells/uL (ref 15–500)
Eosinophils Relative: 1.6 %
HCT: 37.2 % (ref 35.0–45.0)
Hemoglobin: 12.3 g/dL (ref 11.7–15.5)
Lymphs Abs: 1122 cells/uL (ref 850–3900)
MCH: 30.4 pg (ref 27.0–33.0)
MCHC: 33.1 g/dL (ref 32.0–36.0)
MCV: 92.1 fL (ref 80.0–100.0)
MPV: 10.9 fL (ref 7.5–12.5)
Monocytes Relative: 12.1 %
Neutro Abs: 3188 cells/uL (ref 1500–7800)
Neutrophils Relative %: 62.5 %
Platelets: 271 10*3/uL (ref 140–400)
RBC: 4.04 10*6/uL (ref 3.80–5.10)
RDW: 12.7 % (ref 11.0–15.0)
Total Lymphocyte: 22 %
WBC: 5.1 10*3/uL (ref 3.8–10.8)

## 2021-09-13 LAB — COMPLETE METABOLIC PANEL WITH GFR
AG Ratio: 1.7 (calc) (ref 1.0–2.5)
ALT: 15 U/L (ref 6–29)
AST: 21 U/L (ref 10–35)
Albumin: 4 g/dL (ref 3.6–5.1)
Alkaline phosphatase (APISO): 64 U/L (ref 37–153)
BUN: 15 mg/dL (ref 7–25)
CO2: 29 mmol/L (ref 20–32)
Calcium: 9.3 mg/dL (ref 8.6–10.4)
Chloride: 95 mmol/L — ABNORMAL LOW (ref 98–110)
Creat: 0.79 mg/dL (ref 0.60–1.00)
Globulin: 2.4 g/dL (calc) (ref 1.9–3.7)
Glucose, Bld: 89 mg/dL (ref 65–99)
Potassium: 4.8 mmol/L (ref 3.5–5.3)
Sodium: 130 mmol/L — ABNORMAL LOW (ref 135–146)
Total Bilirubin: 0.5 mg/dL (ref 0.2–1.2)
Total Protein: 6.4 g/dL (ref 6.1–8.1)
eGFR: 77 mL/min/{1.73_m2} (ref >=60)

## 2021-09-13 LAB — HEMOGLOBIN A1C
Hgb A1c MFr Bld: 5.4 % of total Hgb (ref <5.7)
Mean Plasma Glucose: 108 mg/dL
eAG (mmol/L): 6 mmol/L

## 2021-09-13 LAB — LIPID PANEL
Cholesterol: 215 mg/dL — ABNORMAL HIGH (ref <200)
HDL: 110 mg/dL (ref >=50)
LDL Cholesterol (Calc): 84 mg/dL (calc)
Non-HDL Cholesterol (Calc): 105 mg/dL (calc) (ref <130)
Total CHOL/HDL Ratio: 2 (calc) (ref <5.0)
Triglycerides: 114 mg/dL (ref <150)

## 2021-09-18 ENCOUNTER — Ambulatory Visit (HOSPITAL_COMMUNITY): Payer: Medicare Other

## 2021-09-19 ENCOUNTER — Encounter (HOSPITAL_BASED_OUTPATIENT_CLINIC_OR_DEPARTMENT_OTHER): Payer: Self-pay | Admitting: Cardiology

## 2021-09-19 ENCOUNTER — Ambulatory Visit (HOSPITAL_BASED_OUTPATIENT_CLINIC_OR_DEPARTMENT_OTHER): Payer: Medicare Other | Admitting: Cardiology

## 2021-09-19 VITALS — BP 108/68 | HR 74 | Ht 64.5 in | Wt 100.0 lb

## 2021-09-19 DIAGNOSIS — I480 Paroxysmal atrial fibrillation: Secondary | ICD-10-CM | POA: Diagnosis not present

## 2021-09-19 DIAGNOSIS — I340 Nonrheumatic mitral (valve) insufficiency: Secondary | ICD-10-CM | POA: Diagnosis not present

## 2021-09-19 DIAGNOSIS — Z952 Presence of prosthetic heart valve: Secondary | ICD-10-CM | POA: Diagnosis not present

## 2021-09-19 DIAGNOSIS — D6859 Other primary thrombophilia: Secondary | ICD-10-CM | POA: Diagnosis not present

## 2021-09-19 DIAGNOSIS — R911 Solitary pulmonary nodule: Secondary | ICD-10-CM

## 2021-09-19 NOTE — Progress Notes (Signed)
?Cardiology Office Note:   ? ?Date:  09/19/2021  ? ?ID:  Helen Davis, DOB 31-May-1940, MRN [id number] ? ?PCP:  Magda Bernheim, NP  ?Cardiologist:  [name], MD ? ?Referring MD: Unk Pinto, MD  ? ?CC: follow up ? ?History of Present Illness:   ? ?Helen Davis is a 81 y.o. female with a hx of mitral regurgitation s/p bioprosthetic MVR at [facility] 05/2021, remote breast cancer who is seen for follow up today. I initially met her 02/15/20 as a new consult at the request of Unk Pinto, MD for the evaluation and management of mitral regurgitation. ? ?Pertinent history: ?-mitral valve replacement (12m Carpentier-Edwards Mitris bioprosthetic valve) [facility] Had heartport incision, healing well. Found to be in new afib at her follow up visit. ?[facility] "A 2.4 x 1.6 cm subsolid spiculated nodule in the anterior right upper lobe. No tracheobronchial tree abnormalities. No pleural effusion."  ?[place] lum[place]omy and 4 mos of chemo initially but then had bilateral mastectomy when another mass found, underwent additional chemo. Remotely seen by Dr. MJana Hakim has been ~20 years since follow up. ? ?Today: ?Doing very well. Can walk 5 miles at a time. Can walk uphill without stopping. Plans to restart going back to the gym.  ? ?We spent time today discussing options for medications, risk of recurrent afib. See summary below. ? ?Reviewed her visit with Dr. BLamonte Sakai ? ?Denies chest pain, shortness of breath at rest or with normal exertion. No PND, orthopnea, LE edema or unexpected weight gain. No syncope or palpitations.  ? ?Past Medical History:  ?Diagnosis Date  ? ABNORMAL EKG   ? Anemia   ? Breast cancer ([place]   ? Fatigue   ? Mitral valve disorders(424.0)   ? Vitamin D deficiency   ? ? ?Past Surgical History:  ?Procedure Laterality Date  ? APPENDECTOMY  1966  ? MASTECTOMY    ? MITRAL VALVE REPLACEMENT  05/29/2021   ? RIGHT/LEFT HEART CATH AND CORONARY ANGIOGRAPHY N/A 05/05/2021  ? Procedure: RIGHT/LEFT HEART CATH AND CORONARY ANGIOGRAPHY;  Surgeon: CSherren Mocha MD;  [facility];  Service: Cardiovascular;  Laterality: N/A;  ? TEE WITHOUT CARDIOVERSION N/A 02/09/2021  ? Procedure: TRANSESOPHAGEAL ECHOCARDIOGRAM (TEE);  Surgeon: CSanda Klein MD;  [place]  Service: Cardiovascular;  Laterality: N/A;  ? [place] ? ? ?Current Medications: ?Current Outpatient Medications on File Prior to Visit  ?Medication Sig  ? Acetaminophen (TYLENOL EXTRA STRENGTH PO) Take by mouth.  ? apixaban (ELIQUIS) 5 MG TABS tablet Take 1 tablet (5 mg total) by mouth 2 (two) times daily.  ? aspirin 81 MG chewable tablet Chew 1 tablet by mouth daily at 12 noon.  ? Bioflavonoid Products (ESTER-C) 500-550 MG TABS Take 500-1,000 mg by mouth daily.  ? Cholecalciferol (VITAMIN D PO) Take 4,000 Int'l Units by mouth daily.  ? Cyanocobalamin (VITAMIN B 12 PO) Take 1,000 mcg by mouth daily. Timed release  ? MAGNESIUM PO Take 500 mg by mouth at bedtime.  ? metoprolol tartrate (LOPRESSOR) 25 MG tablet Take 0.5 tablets (12.5 mg total) by mouth 2 (two) times daily.  ? Multiple Vitamins-Minerals (CENTRUM SILVER PO) Take 1 tablet by mouth daily. With iron  ? Zinc 30 MG TABS Take 30 mg by mouth daily.  ? ?No current facility-administered medications on file prior to visit.  ?  ? ?Allergies:  Covid-19 mrna vacc (moderna)  ? ?Social History  ? ?Tobacco Use  ? Smoking status: Former  ?  Types: Cigarettes  ?  Quit date: 05/21/1972  ?  Years since quitting: 49.3  ? Smokeless tobacco: Never  ?Substance Use Topics  ? Alcohol use: Yes  ?  Alcohol/week: 3.0 standard drinks  ?  Types: 3 Glasses of wine per week  ? Drug use: No  ? ? ?Family History: ?family history includes Atrial fibrillation in her mother; Cancer in an other family member; Dementia in her father; Emphysema in her father; Heart disease in her sister;  Hypertension in an other family member; Stroke in her mother and another family member. ? ?ROS:   ?Please see the history of present illness.   ?Additional pertinent ROS otherwise unremarkable. ? ?EKGs/Labs/Other Studies Reviewed:   ? ?The following studies were reviewed today: ?Cath 05/05/21 ?1.  Widely patent coronary arteries with minimal luminal irregularities and no significant stenoses, right dominant ?2.  Calcified mitral annulus seen on plain fluoroscopy ?3.  Normal right heart hemodynamics with mean pulmonary artery pressure 21, pulmonary capillary wedge V wave of 14, mean wedge pressure of 9, preserved cardiac output of 6 L/min by Fick calculation ?  ?Recommend: Patient in work-up for surgical mitral valve repair or replacement ? ?Echo TEE 02/09/2021: ? 1. Left ventricular ejection fraction, by estimation, is 60 to 65%. The  ?left ventricle has normal function. The left ventricle has no regional  ?wall motion abnormalities. The left ventricular internal cavity size was  ?mildly dilated.  ? 2. Right ventricular systolic function is normal. The right ventricular  ?size is normal. There is normal pulmonary artery systolic pressure.  ? 3. Left atrial size was mild to moderately dilated. No left atrial/left  ?atrial appendage thrombus was detected.  ? 4. The lateral half of the mitral posterior leaflet middle scallop (P2  ?towards the P1 subcommissure) has severe prolapse. The effective  ?regurgitant orifice area is 0.83 cm2, the regurgitant volume 166 ml. the  ?regurgitant fraction is 80%. The posterior  ?leaflet mobile segment length is 11 mm, after allowing for encroachment of mitral annular calcification into the base of the leaflet. The maximum  ?coaptation gap is 5 mm, the coaptation defect width is approximately 10  ?mm. The mitral valve is myxomatous.  ?Severe mitral valve regurgitation. No evidence of mitral stenosis. There  ?is severe holosystolic prolapse of the middle scallop of the posterior   ?leaflet of the mitral valve. The mean mitral valve gradient is 2.0 mmHg  ?with average heart rate of 66 bpm.  ? 5. Tricuspid valve regurgitation is mild to moderate.  ? 6. The aortic valve is tricuspid. Aortic valve regurgitation is mild. No  ?aortic stenosis is present.  ? ?Echo 01/19/2021: ?1. Left ventricular ejection fraction, by estimation, is 65 to 70%. Left  ?ventricular ejection fraction by 3D volume is 67 %. The left ventricle has  ?normal function. The left ventricle has no regional wall motion  ?abnormalities. The left ventricular internal cavity size was moderately dilated. Left ventricular diastolic parameters are consistent with Grade III diastolic dysfunction (restrictive). Elevated left ventricular end-diastolic pressure.  ? 2. Right ventricular systolic function is normal. The right ventricular  ?size is mildly enlarged. There is normal pulmonary artery systolic  ?pressure.  ? 3. Left atrial size was severely dilated.  ? 4. Right atrial size was moderately dilated.  ? 5. 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 No evidence of  ?mitral stenosis. There  is moderate late systolic prolapse of the of the  ?posterior leaflet of the mitral valve. Moderate mitral annular  ?calcification. Severe mitral valve regurgitation with eccentric anteriorly directed jet that wraps around the entire left atrium.  ? 6. The aortic valve is tricuspid. Aortic valve regurgitation is mild.  ?Mild aortic valve sclerosis is present, with no evidence of aortic valve stenosis. Aortic regurgitation PHT measures 703 msec. Aortic valve area, by VTI measures 1.79 cm?Marland Kitchen Aortic valve mean gradient measures 3.0 mmHg. Aortic valve Vmax measures 1.25 m/s.  ? 7. There is mild dilatation of the aortic root, measuring 38 mm. There is mild dilatation of the ascending aorta, measuring 38 mm.  ? 8. The inferior vena cava is normal in size with greater than 50%  ?respiratory variability, suggesting right atrial pressure of 3 mmHg.  ? 9.  Additional Comments: There is a cystic structure in the liver  ?measuring 2.14cm x 2.72cm.  ?10. Recommend TEE for further evaluation of mitral regurgitation.  ?11. Compared to prior study, LV dimensions have

## 2021-09-19 NOTE — Patient Instructions (Addendum)
Medication Instructions:  ?STOP AMIODARONE  ? ?*If you need a refill on your cardiac medications before your next appointment, please call your pharmacy* ? ?Lab Work: ?NONE ? ?Testing/Procedures: ?NONE ? ?Follow-Up: ?At [facility], you and your health needs are our priority.  As part of our continuing mission to provide you with exceptional heart care, we have created designated Provider Care Teams.  These Care Teams include your primary Cardiologist (physician) and Advanced Practice Providers (APPs -  Physician Assistants and Nurse Practitioners) who all work together to provide you with the care you need, when you need it. ? ?We recommend signing up for the patient portal called "MyChart".  Sign up information is provided on this After Visit Summary.  MyChart is used to connect with patients for Virtual Visits (Telemedicine).  Patients are able to view lab/test results, encounter notes, upcoming appointments, etc.  Non-urgent messages can be sent to your provider as well.   ?To learn more about what you can do with MyChart, go to [email or web address].   ? ?Your next appointment:   ?3 month(s) ? ?The format for your next appointment:   ?In Person ? ?Provider:   ?[name], MD  ? ? ? ? ? ? ?

## 2021-09-20 ENCOUNTER — Ambulatory Visit (HOSPITAL_COMMUNITY): Payer: Medicare Other

## 2021-09-20 ENCOUNTER — Ambulatory Visit (INDEPENDENT_AMBULATORY_CARE_PROVIDER_SITE_OTHER): Payer: Medicare Other

## 2021-09-20 DIAGNOSIS — E871 Hypo-osmolality and hyponatremia: Secondary | ICD-10-CM

## 2021-09-20 NOTE — Progress Notes (Signed)
The patient came in for repeat labs today. She reports she has not been eating a pickle daily; however, she has been eating foods containing more sodium. She reports no new issues and that her current sodium level is actually higher than in previous labs.  ?

## 2021-09-21 LAB — BASIC METABOLIC PANEL WITH GFR
BUN: 17 mg/dL (ref 7–25)
CO2: 27 mmol/L (ref 20–32)
Calcium: 9 mg/dL (ref 8.6–10.4)
Chloride: 96 mmol/L — ABNORMAL LOW (ref 98–110)
Creat: 0.76 mg/dL (ref 0.60–1.00)
Glucose, Bld: 86 mg/dL (ref 65–99)
Potassium: 4.4 mmol/L (ref 3.5–5.3)
Sodium: 130 mmol/L — ABNORMAL LOW (ref 135–146)
eGFR: 80 mL/min/{1.73_m2} (ref >=60)

## 2021-09-22 ENCOUNTER — Ambulatory Visit (HOSPITAL_COMMUNITY): Payer: Medicare Other

## 2021-09-25 ENCOUNTER — Ambulatory Visit (HOSPITAL_COMMUNITY): Payer: Medicare Other

## 2021-09-27 ENCOUNTER — Ambulatory Visit (HOSPITAL_COMMUNITY): Payer: Medicare Other

## 2021-09-29 ENCOUNTER — Ambulatory Visit (HOSPITAL_COMMUNITY): Payer: Medicare Other

## 2021-10-02 ENCOUNTER — Ambulatory Visit (HOSPITAL_COMMUNITY): Payer: Medicare Other

## 2021-10-03 DIAGNOSIS — H26492 Other secondary cataract, left eye: Secondary | ICD-10-CM | POA: Diagnosis not present

## 2021-10-03 DIAGNOSIS — H5213 Myopia, bilateral: Secondary | ICD-10-CM | POA: Diagnosis not present

## 2021-10-03 DIAGNOSIS — H59811 Chorioretinal scars after surgery for detachment, right eye: Secondary | ICD-10-CM | POA: Diagnosis not present

## 2021-10-03 DIAGNOSIS — H524 Presbyopia: Secondary | ICD-10-CM | POA: Diagnosis not present

## 2021-10-04 ENCOUNTER — Ambulatory Visit (HOSPITAL_COMMUNITY): Payer: Medicare Other

## 2021-10-06 ENCOUNTER — Ambulatory Visit (HOSPITAL_COMMUNITY): Payer: Medicare Other

## 2021-10-09 ENCOUNTER — Ambulatory Visit (HOSPITAL_COMMUNITY): Payer: Medicare Other

## 2021-10-11 ENCOUNTER — Ambulatory Visit: Payer: Medicare Other | Admitting: Orthopaedic Surgery

## 2021-10-11 ENCOUNTER — Encounter: Payer: Self-pay | Admitting: Orthopaedic Surgery

## 2021-10-11 ENCOUNTER — Ambulatory Visit (HOSPITAL_COMMUNITY): Payer: Medicare Other

## 2021-10-11 DIAGNOSIS — M1711 Unilateral primary osteoarthritis, right knee: Secondary | ICD-10-CM | POA: Diagnosis not present

## 2021-10-11 MED ORDER — METHYLPREDNISOLONE ACETATE 40 MG/ML IJ SUSP
80.0000 mg | INTRAMUSCULAR | Status: AC | PRN
  Administered 2021-10-11: 80 mg via INTRA_ARTICULAR

## 2021-10-11 MED ORDER — LIDOCAINE HCL 1 % IJ SOLN
2.0000 mL | INTRAMUSCULAR | Status: AC | PRN
  Administered 2021-10-11: 2 mL

## 2021-10-11 MED ORDER — BUPIVACAINE HCL 0.25 % IJ SOLN
2.0000 mL | INTRAMUSCULAR | Status: AC | PRN
  Administered 2021-10-11: 2 mL via INTRA_ARTICULAR

## 2021-10-11 NOTE — Progress Notes (Signed)
Office Visit Note   Patient: Helen Davis           Date of Birth: 06-Jan-1941           MRN: [id number] Visit Date: 10/11/2021              Requested by: Unk Pinto, [address] [place],  [place] [id number] PCP: Unk Pinto, MD   Assessment & Plan: Visit Diagnoses:  1. Unilateral primary osteoarthritis, right knee     Plan: Pam has unilateral osteoarthritis of her right knee predominate the lateral compartment.  Several years ago she had a cortisone injection which is worked up until just recently.  She walks up to 4 miles a day and is recently had a bit more trouble with her knee and would like to have another injection.  This was performed without difficulty.  We will plan to see her back as needed.  Had films several years ago of the right knee demonstrating tricompartmental changes predominantly in the lateral compartment with slight valgus  Follow-Up Instructions: Return if symptoms worsen or fail to improve.   Orders:  No orders of the defined types were placed in this encounter.  No orders of the defined types were placed in this encounter.     Procedures: Large Joint Inj: R knee on 10/11/2021 2:48 PM Indications: pain and diagnostic evaluation Details: 25 G 1.5 in needle, anteromedial approach  Arthrogram: No  Medications: 2 mL lidocaine 1 %; 80 mg methylPREDNISolone acetate 40 MG/ML; 2 mL bupivacaine 0.25 % Procedure, treatment alternatives, risks and benefits explained, specific risks discussed. Consent was given by the patient. Immediately prior to procedure a time out was called to verify the correct patient, procedure, equipment, support staff and site/side marked as required. Patient was prepped and draped in the usual sterile fashion.      Clinical Data: No additional findings.   Subjective: Chief Complaint  Patient presents with   Right Knee - Pain  Patient presents today for recurrent chronic right knee pain. She  gets cortisone injections as needed. She said that it has been quite some time since she needed one. She walks 44mles daily and has noticed that going up or down hills is uncomfortable. She would like to get a cortisone injection today.   HPI  Review of Systems   Objective: Vital Signs: LMP  (LMP Unknown)   Physical Exam Constitutional:      Appearance: She is well-developed.  Eyes:     Pupils: Pupils are equal, round, and reactive to light.  Pulmonary:     Effort: Pulmonary effort is normal.  Skin:    General: Skin is warm and dry.  Neurological:     Mental Status: She is alert and oriented to person, place, and time.  Psychiatric:        Behavior: Behavior normal.    Ortho Exam right knee was not hot warm or red.  Mostly lateral joint pain with slight valgus with weightbearing.  No instability.  Full extension flexed over 100 degrees.  No popliteal pain or mass.  No calf pain.  No effusion  Specialty Comments:  No specialty comments available.  Imaging: No results found.   PMFS History: Patient Active Problem List   Diagnosis Date Noted   Pulmonary nodule 07/25/2021   Rupture of extensor tendon of hand, initial encounter 02/16/2021   Screening for ischemic heart disease 01/26/2021   Abnormal glucose 08/03/2020   Elevated BP without diagnosis of  hypertension 08/03/2020   Unilateral primary osteoarthritis, right knee 05/13/2019   Varicose veins with ulcer, right ([place]) 02/16/2019   COPD (chronic obstructive pulmonary disease) ([place]) 12/09/2015   Mild malnutrition ([place]) 12/08/2015   DJD (degenerative joint disease) 07/28/2015   Mixed hyperlipidemia 11/15/2014   Osteoporosis 11/15/2014   Anemia    History of breast cancer    Vitamin D deficiency    Nonrheumatic mitral valve regurgitation 10/11/2009   Past Medical History:  Diagnosis Date   ABNORMAL EKG    Anemia    Breast cancer ([facility])    Fatigue    Mitral valve disorders(424.0)    Vitamin D deficiency      Family History  Problem Relation Age of Onset   Hypertension Other    Stroke Other    Cancer Other    Atrial fibrillation Mother    Stroke Mother    Emphysema Father    Dementia Father    Heart disease Sister        CHF    Past Surgical History:  Procedure Laterality Date   APPENDECTOMY  1966   MASTECTOMY     MITRAL VALVE REPLACEMENT  05/29/2021   RIGHT/LEFT HEART CATH AND CORONARY ANGIOGRAPHY N/A 05/05/2021   Procedure: RIGHT/LEFT HEART CATH AND CORONARY ANGIOGRAPHY;  Surgeon: Sherren Mocha, MD;  Location: [facility];  Service: Cardiovascular;  Laterality: N/A;   TEE WITHOUT CARDIOVERSION N/A 02/09/2021   Procedure: TRANSESOPHAGEAL ECHOCARDIOGRAM (TEE);  Surgeon: Sanda Klein, MD;  Location: [facility] ENDOSCOPY;  Service: Cardiovascular;  Laterality: N/A;   VARICOSE VEIN SURGERY Right 1975   Social History   Occupational History   Not on file  Tobacco Use   Smoking status: Former    Types: Cigarettes    Quit date: 05/21/1972    Years since quitting: 49.4   Smokeless tobacco: Never  Substance and Sexual Activity   Alcohol use: Yes    Alcohol/week: 3.0 standard drinks    Types: 3 Glasses of wine per week   Drug use: No   Sexual activity: Not on file

## 2021-10-13 ENCOUNTER — Ambulatory Visit (HOSPITAL_COMMUNITY): Payer: Medicare Other

## 2021-10-18 ENCOUNTER — Ambulatory Visit (HOSPITAL_COMMUNITY): Payer: Medicare Other

## 2021-10-20 ENCOUNTER — Ambulatory Visit (HOSPITAL_COMMUNITY): Payer: Medicare Other

## 2021-10-23 ENCOUNTER — Ambulatory Visit (HOSPITAL_COMMUNITY): Payer: Medicare Other

## 2021-10-25 ENCOUNTER — Ambulatory Visit (HOSPITAL_COMMUNITY): Payer: Medicare Other

## 2021-10-27 ENCOUNTER — Ambulatory Visit (HOSPITAL_COMMUNITY): Payer: Medicare Other

## 2021-10-30 ENCOUNTER — Ambulatory Visit (HOSPITAL_COMMUNITY): Payer: Medicare Other

## 2021-11-01 ENCOUNTER — Ambulatory Visit (HOSPITAL_COMMUNITY): Payer: Medicare Other

## 2021-11-03 ENCOUNTER — Ambulatory Visit (HOSPITAL_COMMUNITY): Payer: Medicare Other

## 2021-11-06 ENCOUNTER — Ambulatory Visit (HOSPITAL_COMMUNITY): Payer: Medicare Other

## 2021-11-08 ENCOUNTER — Ambulatory Visit (HOSPITAL_COMMUNITY): Payer: Medicare Other

## 2021-11-10 ENCOUNTER — Ambulatory Visit (HOSPITAL_COMMUNITY): Payer: Medicare Other

## 2021-11-14 ENCOUNTER — Telehealth: Payer: Self-pay | Admitting: Cardiology

## 2021-11-14 ENCOUNTER — Other Ambulatory Visit (HOSPITAL_BASED_OUTPATIENT_CLINIC_OR_DEPARTMENT_OTHER): Payer: Self-pay | Admitting: Family

## 2021-11-14 DIAGNOSIS — I4819 Other persistent atrial fibrillation: Secondary | ICD-10-CM

## 2021-11-14 MED ORDER — METOPROLOL TARTRATE 25 MG PO TABS: 12.5000 mg | ORAL_TABLET | Freq: Two times a day (BID) | ORAL | 3 refills | Status: DC

## 2021-11-14 NOTE — Telephone Encounter (Signed)
Please review for refill. Thank you! 

## 2021-11-14 NOTE — Telephone Encounter (Signed)
Patient said that Dr. Cristal Deer needs to send in a new prescription so that the she can get her medication metoprolol tartrate (LOPRESSOR) 25 MG tablet refilled

## 2021-11-29 ENCOUNTER — Other Ambulatory Visit: Payer: Self-pay | Admitting: Emergency Medicine

## 2021-11-29 ENCOUNTER — Ambulatory Visit
Admission: RE | Admit: 2021-11-29 | Discharge: 2021-11-29 | Disposition: A | Discharge status: Home / Self Care | Payer: Self-pay | Source: Ambulatory Visit | Attending: Emergency Medicine | Admitting: Emergency Medicine

## 2021-11-29 ENCOUNTER — Ambulatory Visit (HOSPITAL_COMMUNITY)
Admission: RE | Admit: 2021-11-29 | Discharge: 2021-11-29 | Disposition: A | Discharge status: Home / Self Care | Payer: Medicare Other | Source: Ambulatory Visit | Attending: Emergency Medicine | Admitting: Emergency Medicine

## 2021-11-29 ENCOUNTER — Encounter (HOSPITAL_COMMUNITY): Payer: Self-pay

## 2021-11-29 DIAGNOSIS — R911 Solitary pulmonary nodule: Secondary | ICD-10-CM

## 2021-11-29 DIAGNOSIS — J948 Other specified pleural conditions: Secondary | ICD-10-CM | POA: Diagnosis not present

## 2021-11-30 ENCOUNTER — Encounter: Payer: Self-pay | Admitting: Emergency Medicine

## 2021-11-30 ENCOUNTER — Ambulatory Visit: Payer: Medicare Other | Admitting: Emergency Medicine

## 2021-11-30 DIAGNOSIS — R911 Solitary pulmonary nodule: Secondary | ICD-10-CM | POA: Diagnosis not present

## 2021-11-30 NOTE — Assessment & Plan Note (Signed)
Right upper lobe groundglass pulmonary nodule is unchanged on her most recent CT scan of the chest 11/29/2021.  Remains moderate suspicion for an adenocarcinoma but no push at this time for diagnostics.  We will plan to repeat her CT chest in 6 months to follow for interval stability or change.  Depending on that scan we will decide whether diagnostics are indicated.

## 2021-11-30 NOTE — Progress Notes (Signed)
Subjective:    Patient ID: Helen Davis, female    DOB: 1940/07/18, 81 y.o.   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 She is active, exercises several days a week. Functional capacity is improved since the valve surgery   CT chest 05/28/2021 ([facility]) showed an incidental 2.4 x 1.6 subsolid spiculated anterior right upper lobe pulmonary nodule.   ROV 11/30/21 --follow-up visit for 81 year old woman with history of minimal tobacco history, breast cancer 1999, suspected RA.  She has CAD in the bioprosthetic mitral valve replacement 05/2021.  Atrial fibrillation.  Part of her evaluation at the time of her valvular surgery included a CT chest that showed an incidental 2.4 x 1.6 subsolid anterior right upper lobe pulmonary nodule at least moderate suspicion for adenocarcinoma.  CT chest 11/29/2021 reviewed by me shows no mediastinal or hilar adenopathy, minimal biapical scar.  The spiculated anterior segment right upper lobe groundglass nodule is 1.9 x 2.4 cm, stable in size compared with 05/28/2021.  There is some mild architectural distortion and volume loss in the adjacent right upper lobe with some associated bronchiectasis and mucoid impaction   Review of Systems As per HPI   Past Medical History:  Diagnosis Date   ABNORMAL EKG    Anemia    Breast cancer ([place])    Fatigue    Mitral valve disorders(424.0)    Vitamin D deficiency      Family History  Problem Relation Age of Onset   Hypertension Other    Stroke Other    Cancer Other    Atrial fibrillation Mother    Stroke Mother    Emphysema Father    Dementia Father    Heart disease Sister        CHF    Sister > lung cancer   Social History   Socioeconomic History   Marital status: Single    Spouse  name: Not on file   Number of children: Not on file   Years of education: Not on file   Highest education level: Not on file  Occupational History   Not on file  Tobacco Use   Smoking status: Former    Types: Cigarettes    Quit date: 05/21/1972    Years since quitting: 49.5   Smokeless tobacco: Never  Substance and Sexual Activity   Alcohol use: Yes    Alcohol/week: 3.0 standard drinks of alcohol    Types: 3 Glasses of wine per week   Drug use: No   Sexual activity: Not on file  Other Topics Concern   Not on file  Social History Narrative   Not on file   Social Determinants of Health   Financial Resource Strain: Not on file  Food Insecurity: Not on file  Transportation Needs: Not on file  Physical Activity: Not on file  Stress: Not on file  Social Connections: Not on file  Intimate Partner Violence: Not on file    From [place], has lived in the [place], in [place] No hx TB or pos 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  2nd hand smoke exposure.   Allergies  Allergen Reactions   Covid-19 Mrna Vacc (Moderna) Rash    Patient states 1 week after moderna had severe blistering rash from left shoulder down to left elbow, will not get second shot  Outpatient Medications Prior to Visit  Medication Sig Dispense Refill   Acetaminophen (TYLENOL EXTRA STRENGTH PO) Take by mouth.     apixaban (ELIQUIS) 5 MG TABS tablet TAKE ONE TABLET BY MOUTH TWICE A DAY 60 tablet 10   aspirin 81 MG chewable tablet Chew 1 tablet by mouth daily at 12 noon.     Bioflavonoid Products (ESTER-C) 500-550 MG TABS Take 500-1,000 mg by mouth daily.     Cholecalciferol (VITAMIN D PO) Take 4,000 Int'l Units by mouth daily.     Cyanocobalamin (VITAMIN B 12 PO) Take 1,000 mcg by mouth daily. Timed release     MAGNESIUM PO Take 500 mg by mouth at bedtime.     metoprolol tartrate (LOPRESSOR) 25 MG tablet Take 0.5 tablets (12.5 mg total) by mouth 2 (two) times daily. 90 tablet 3   Multiple Vitamins-Minerals  (CENTRUM SILVER PO) Take 1 tablet by mouth daily. With iron     Zinc 30 MG TABS Take 30 mg by mouth daily.     No facility-administered medications prior to visit.        Objective:   Physical Exam Vitals:   11/30/21 1445  BP: 112/70  Pulse: 87  SpO2: 96%  Weight: 98 lb (44.5 kg)  Height: 5' 4.5" (1.638 m)   Gen: Pleasant, well-nourished, in no distress,  normal affect  ENT: No lesions,  mouth clear,  oropharynx clear, no postnasal drip  Neck: No JVD, no stridor  Lungs: No use of accessory muscles, no crackles or wheezing on normal respiration, no wheeze on forced expiration  Cardiovascular: RRR, S1 and S2 click.  No murmur  Musculoskeletal: No deformities, no cyanosis or clubbing  Neuro: alert, awake, non focal  Skin: Warm, no lesions or rash      Assessment & Plan:  Pulmonary nodule Right upper lobe groundglass pulmonary nodule is unchanged on her most recent CT scan of the chest 11/29/2021.  Remains moderate suspicion for an adenocarcinoma but no push at this time for diagnostics.  We will plan to repeat her CT chest in 6 months to follow for interval stability or change.  Depending on that scan we will decide whether diagnostics are indicated.   Baltazar Apo, MD, PhD 11/30/2021, 2:59 PM [facility] [phone] or if no answer before 7:00PM call [phone] For any issues after 7:00PM please call eLink [phone]

## 2021-11-30 NOTE — Addendum Note (Signed)
Addended by: Valerie Salts on: 11/30/2021 03:06 PM   Modules accepted: Orders

## 2021-11-30 NOTE — Patient Instructions (Signed)
We reviewed your CT scan of the chest today. We will plan to repeat your CT chest in 6 months to follow a pulmonary nodule. Follow Dr. Lamonte Sakai in 6 months after your CT so we can review the results together.

## 2021-12-13 DIAGNOSIS — H01009 Unspecified blepharitis unspecified eye, unspecified eyelid: Secondary | ICD-10-CM | POA: Diagnosis not present

## 2021-12-13 DIAGNOSIS — H00014 Hordeolum externum left upper eyelid: Secondary | ICD-10-CM | POA: Diagnosis not present

## 2021-12-14 ENCOUNTER — Encounter (HOSPITAL_BASED_OUTPATIENT_CLINIC_OR_DEPARTMENT_OTHER): Payer: Self-pay | Admitting: Cardiology

## 2021-12-14 ENCOUNTER — Ambulatory Visit (HOSPITAL_BASED_OUTPATIENT_CLINIC_OR_DEPARTMENT_OTHER): Payer: Medicare Other | Admitting: Cardiology

## 2021-12-14 VITALS — BP 107/70 | HR 85 | Ht 64.5 in | Wt 98.3 lb

## 2021-12-14 DIAGNOSIS — I340 Nonrheumatic mitral (valve) insufficiency: Secondary | ICD-10-CM

## 2021-12-14 DIAGNOSIS — Z952 Presence of prosthetic heart valve: Secondary | ICD-10-CM | POA: Diagnosis not present

## 2021-12-14 DIAGNOSIS — I480 Paroxysmal atrial fibrillation: Secondary | ICD-10-CM

## 2021-12-14 DIAGNOSIS — Z7189 Other specified counseling: Secondary | ICD-10-CM

## 2021-12-14 DIAGNOSIS — D6859 Other primary thrombophilia: Secondary | ICD-10-CM | POA: Diagnosis not present

## 2021-12-14 NOTE — Progress Notes (Signed)
Cardiology Office Note:    Date:  12/15/2021   ID:  Helen Davis, DOB 10-May-1941, MRN [id number]  PCP:  Unk Pinto, MD  Cardiologist:  [name], MD  Referring MD: Alycia Rossetti, NP   CC: follow up  History of Present Illness:    Helen Davis is a 81 y.o. female with a hx of mitral regurgitation s/p bioprosthetic MVR at [facility] 05/2021, remote breast cancer who is seen for follow up today. I initially met her 02/15/20 as a new consult at the request of Alycia Rossetti, NP for the evaluation and management of mitral regurgitation.  Pertinent history: -mitral valve replacement (33m Carpentier-Edwards Mitris bioprosthetic valve) [facility] Had heartport incision, healing well. Found to be in new afib at her follow up visit. [facility] "A 2.4 x 1.6 cm subsolid spiculated nodule in the anterior right upper lobe. No tracheobronchial tree abnormalities. No pleural effusion."  [place] lum[place] and 4 mos of chemo initially but then had bilateral mastectomy when another mass found, underwent additional chemo. Remotely seen by Dr. MJana Hakim has been ~20 years since follow up.  Today: Doing well. Walks 3-5 miles every day. Does have some fatigue but it does not limit her. No palpitations. Takes BP/HR every few days, has been stable.   We discussed postop afib at length today. No symptoms since stopping amiodarone. Discussed anticoagulation, see below.  Denies chest pain, shortness of breath at rest or with normal exertion. No PND, orthopnea, LE edema or unexpected weight gain. No syncope or palpitations.   Past Medical History:  Diagnosis Date   ABNORMAL EKG    Anemia    [place]    Fatigue    Mitral valve disorders(424.0)    Vitamin D deficiency     Past Surgical History:  Procedure Laterality Date   APPENDECTOMY  1966   MASTECTOMY     MITRAL VALVE  REPLACEMENT  05/29/2021   RIGHT/LEFT HEART CATH AND CORONARY ANGIOGRAPHY N/A 05/05/2021   Procedure: RIGHT/LEFT HEART CATH AND CORONARY ANGIOGRAPHY;  Surgeon: CSherren Mocha MD;  [facility];  Service: Cardiovascular;  Laterality: N/A;   TEE WITHOUT CARDIOVERSION N/A 02/09/2021   Procedure: TRANSESOPHAGEAL ECHOCARDIOGRAM (TEE);  Surgeon: CSanda Klein MD;  [facility];  Service: Cardiovascular;  Laterality: N/A;   VARICOSE VEIN SURGERY Right 1975    Current Medications: Current Outpatient Medications on File Prior to Visit  Medication Sig   Acetaminophen (TYLENOL EXTRA STRENGTH PO) Take by mouth.   apixaban (ELIQUIS) 5 MG TABS tablet TAKE ONE TABLET BY MOUTH TWICE A DAY   aspirin 81 MG chewable tablet Chew 1 tablet by mouth daily at 12 noon.   Bioflavonoid Products (ESTER-C) 500-550 MG TABS Take 500-1,000 mg by mouth daily.   Cholecalciferol (VITAMIN D PO) Take 4,000 Int'l Units by mouth daily.   Cyanocobalamin (VITAMIN B 12 PO) Take 1,000 mcg by mouth daily. Timed release   MAGNESIUM PO Take 500 mg by mouth at bedtime.   metoprolol tartrate (LOPRESSOR) 25 MG tablet Take 0.5 tablets (12.5 mg total) by mouth 2 (two) times daily.   Multiple Vitamins-Minerals (CENTRUM SILVER PO) Take 1 tablet by mouth daily. With iron   Zinc 30 MG TABS Take 30 mg by mouth daily.   No current facility-administered medications on file prior to visit.     Allergies:   Covid-19 mrna vacc (moderna)  Social History   Tobacco Use   Smoking status: Former    Types: Cigarettes    Quit date: 05/21/1972    Years since quitting: 49.6   Smokeless tobacco: Never  Substance Use Topics   Alcohol use: Yes    Alcohol/week: 3.0 standard drinks of alcohol    Types: 3 Glasses of wine per week   Drug use: No    Family History: family history includes Atrial fibrillation in her mother; Cancer in an other family member; Dementia in her father; Emphysema in her father; Heart disease in  her sister; Hypertension in an other family member; Stroke in her mother and another family member.  ROS:   Please see the history of present illness.   Additional pertinent ROS otherwise unremarkable.  EKGs/Labs/Other Studies Reviewed:    The following studies were reviewed today: Cath 05/05/21 1.  Widely patent coronary arteries with minimal luminal irregularities and no significant stenoses, right dominant 2.  Calcified mitral annulus seen on plain fluoroscopy 3.  Normal right heart hemodynamics with mean pulmonary artery pressure 21, pulmonary capillary wedge V wave of 14, mean wedge pressure of 9, preserved cardiac output of 6 L/min by Fick calculation   Recommend: Patient in work-up for surgical mitral valve repair or replacement  Echo TEE 02/09/2021:  1. Left ventricular ejection fraction, by estimation, is 60 to 65%. The  left ventricle has normal function. The left ventricle has no regional  wall motion abnormalities. The left ventricular internal cavity size was  mildly dilated.   2. Right ventricular systolic function is normal. The right ventricular  size is normal. There is normal pulmonary artery systolic pressure.   3. Left atrial size was mild to moderately dilated. No left atrial/left  atrial appendage thrombus was detected.   4. The lateral half of the mitral posterior leaflet middle scallop (P2  towards the P1 subcommissure) has severe prolapse. The effective  regurgitant orifice area is 0.83 cm2, the regurgitant volume 166 ml. the  regurgitant fraction is 80%. The posterior  leaflet mobile segment length is 11 mm, after allowing for encroachment of mitral annular calcification into the base of the leaflet. The maximum  coaptation gap is 5 mm, the coaptation defect width is approximately 10  mm. The mitral valve is myxomatous.  Severe mitral valve regurgitation. No evidence of mitral stenosis. There  is severe holosystolic prolapse of the middle scallop of the  posterior  leaflet of the mitral valve. The mean mitral valve gradient is 2.0 mmHg  with average heart rate of 66 bpm.   5. Tricuspid valve regurgitation is mild to moderate.   6. The aortic valve is tricuspid. Aortic valve regurgitation is mild. No  aortic stenosis is present.   Echo 01/19/2021: 1. Left ventricular ejection fraction, by estimation, is 65 to 70%. Left  ventricular ejection fraction by 3D volume is 67 %. The left ventricle has  normal function. The left ventricle has no regional wall motion  abnormalities. The left ventricular internal cavity size was moderately dilated. Left ventricular diastolic parameters are consistent with Grade III diastolic dysfunction (restrictive). Elevated left ventricular end-diastolic pressure.   2. Right ventricular systolic function is normal. The right ventricular  size is mildly enlarged. There is normal pulmonary artery systolic  pressure.   3. Left atrial size was severely dilated.   4. Right atrial size was moderately dilated.   5. 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 No evidence of  mitral stenosis. There is moderate late systolic  prolapse of the of the  posterior leaflet of the mitral valve. Moderate mitral annular  calcification. Severe mitral valve regurgitation with eccentric anteriorly directed jet that wraps around the entire left atrium.   6. The aortic valve is tricuspid. Aortic valve regurgitation is mild.  Mild aortic valve sclerosis is present, with no evidence of aortic valve stenosis. Aortic regurgitation PHT measures 703 msec. Aortic valve area, by VTI measures 1.79 cm. Aortic valve mean gradient measures 3.0 mmHg. Aortic valve Vmax measures 1.25 m/s.   7. There is mild dilatation of the aortic root, measuring 38 mm. There is mild dilatation of the ascending aorta, measuring 38 mm.   8. The inferior vena cava is normal in size with greater than 50%  respiratory variability, suggesting right atrial pressure of 3  mmHg.   9. Additional Comments: There is a cystic structure in the liver  measuring 2.14cm x 2.72cm.  10. Recommend TEE for further evaluation of mitral regurgitation.  11. Compared to prior study, LV dimensions have increased with preserved LVF. Mitral regurgitation appears severe.   Echo 01/06/20  1. Left ventricular ejection fraction, by estimation, is 60 to 65%. The  left ventricle has normal function. The left ventricle has no regional  wall motion abnormalities. Left ventricular diastolic parameters are  consistent with Grade II diastolic dysfunction (pseudonormalization).   2. Right ventricular systolic function is normal. The right ventricular  size is normal. There is normal pulmonary artery systolic pressure.   3. Left atrial size was severely dilated.   4. Likely severe mitral regurgitation with multiple jets. Coanda effect  noted on the interatrial septum. No pumonary vein reverse flow noted, though only one PV is sampled. PISA was not obtained. Consider TEE for further evaluation if clinically  indicated. The mitral valve is normal in structure. Severe mitral valve  regurgitation. No evidence of mitral stenosis.   5. The aortic valve is tricuspid. Aortic valve regurgitation is mild. No  aortic stenosis is present.   6. The inferior vena cava is normal in size with greater than 50%  respiratory variability, suggesting right atrial pressure of 3 mmHg.   EKG:  EKG is personally reviewed.   09/19/21: NSR at 74 bpm, LVH with repol 06/22/21: atrial fibrillation with RVR at 113 bpm, LVH with repol 04/10/2021: not ordered today 01/30/2021: NSR at 70 bpm, LVH 02/15/20: sinus rhythm with PACs, LVH  Recent Labs: 01/26/2021: Magnesium 2.1 07/10/2021: TSH 3.290 09/12/2021: ALT 15; Hemoglobin 12.3; Platelets 271 09/20/2021: BUN 17; Creat 0.76; Potassium 4.4; Sodium 130   Recent Lipid Panel    Component Value Date/Time   CHOL 215 (H) 09/12/2021 1513   TRIG 114 09/12/2021 1513   HDL 110  09/12/2021 1513   CHOLHDL 2.0 09/12/2021 1513   VLDL 15 12/17/2016 1432   LDLCALC 84 09/12/2021 1513    Physical Exam:    VS:  BP 107/70 (BP Location: Left Arm, Patient Position: Sitting, Cuff Size: Normal)   Pulse 85   Ht 5' 4.5" (1.638 m)   Wt 98 lb 4.8 oz (44.6 kg)   LMP  (LMP Unknown)   SpO2 99%   BMI 16.61 kg/m     Wt Readings from Last 3 Encounters:  12/14/21 98 lb 4.8 oz (44.6 kg)  11/30/21 98 lb (44.5 kg)  09/20/21 100 lb 12.8 oz (45.7 kg)   GEN: Well nourished, well developed in no acute distress HEENT: Normal, moist mucous membranes. Sty in left eye. NECK: No JVD CARDIAC: regular rhythm, normal  S1 and S2, no rubs or gallops. No murmur. VASCULAR: Radial and DP pulses 2+ bilaterally. No carotid bruits RESPIRATORY:  Clear to auscultation without rales, wheezing or rhonchi  ABDOMEN: Soft, non-tender, non-distended MUSCULOSKELETAL:  Ambulates independently SKIN: Warm and dry, no edema NEUROLOGIC:  Alert and oriented x 3. No focal neuro deficits noted. PSYCHIATRIC:  Normal affect    ASSESSMENT:    1. Severe mitral regurgitation   2. S/P MVR (mitral valve replacement)   3. Paroxysmal atrial fibrillation (HCC)   4. Hypercoagulable state ([place])   5. Cardiac risk counseling     PLAN:    Severe mitral regurgitation S/P Bioprosthetic MVR (79m Carpentier-Edwards Mitris) [facility] Postoperative atrial fibrillation with RVR -CHA2DS2/VAS Stroke Risk Points= 4  -continue apixaban, see shared decision making. -has not had any palpitations off of amiodarone. 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 Both her mother and sister have had strokes. She does bruise easily but feels this is manageable. After shared decision making, will continue anticoagulation. -aspirin 81 mg chronically due to valve -prophylactic antibiotics before dental cleanings. -continue low dose metoprolol  Pulmonary nodule found on recent CT  scan History of breast cancer s/p bilateral mastectomy and chemo -concern with spiculated nodule seen on CT. Recently saw Dr. BLamonte Sakaiwith repeat scan, stable. Following up in 6 mos.  Cardiac risk counseling and prevention recommendations: -recommend heart healthy/Mediterranean diet, with whole grains, fruits, vegetable, fish, lean meats, nuts, and olive oil. Limit salt. -recommend moderate walking, 3-5 times/week for 30-50 minutes each session. Aim for at least 150 minutes.week. Goal should be pace of 3 miles/hours, or walking 1.5 miles in 30 minutes -recommend avoidance of tobacco products. Avoid excess alcohol.   Plan for follow up: 6 mos or sooner as needed  BBuford Dresser MD, PhD, [facility]    Medication Adjustments/Labs and Tests Ordered: Current medicines are reviewed at length with the patient today.  Concerns regarding medicines are outlined above.   No orders of the defined types were placed in this encounter.  No orders of the defined types were placed in this encounter.  Patient Instructions  Medication Instructions:  Your Physician recommend you continue on your current medication as directed.    *If you need a refill on your cardiac medications before your next appointment, please call your pharmacy*   Lab Work: None ordered today   Testing/Procedures: None ordered today   Follow-Up: [facility] and [facility] are our [facility].  As part of our continuing mission to provide you with exceptional heart care, we have created designated Provider Care Teams.  These Care Teams include your primary Cardiologist (physician) and Advanced Practice Providers (APPs -  Physician Assistants and Nurse Practitioners) who all work together to provide you with the care you need, when you need it.  We recommend signing up for the patient portal called "MyChart".  Sign up information is provided on this After Visit Summary.  MyChart is  used to connect with patients for Virtual Visits (Telemedicine).  Patients are able to view lab/test results, encounter notes, upcoming appointments, etc.  Non-urgent messages can be sent to your provider as well.   To learn more about what you can do with MyChart, go to [email or web address]    Your next appointment:   6 month(s)  The format for your next appointment:   In Person  Provider:   BBuford Dresser MD{  [name], MD, PhD, [facility]  [facility]    Signed, [name], MD PhD 12/15/2021  [place]

## 2021-12-14 NOTE — Patient Instructions (Signed)

## 2022-01-01 ENCOUNTER — Ambulatory Visit (HOSPITAL_BASED_OUTPATIENT_CLINIC_OR_DEPARTMENT_OTHER): Payer: Medicare Other | Admitting: Cardiology

## 2022-01-26 NOTE — Progress Notes (Unsigned)
CPE AND 3 MONTH FOLLOW UP  Assessment:    Severe mitral regurgitation Has cardiology appointment  Chronic obstructive pulmonary disease, unspecified COPD type ([place])   continue to avoid triggers, call if any change in symptoms, continue exercise- albuterol sent in  Osteoporosis, unspecified osteoporosis type, unspecified pathological fracture presence last one 2011, DECLINES-continue vitamin D  Anemia, unspecified type -     CBC with Differential/Platelet  History of breast cancer S/p mastectomy no need for MGM  Mild malnutrition ([place]) Add ensure -     Hepatic function panel  Mixed hyperlipidemia -continue medications, check lipids, decrease fatty foods, increase activity.  -     Lipid panel  Vitamin D deficiency -     continue supplement  Primary osteoarthritis involving multiple joints Continue walking  Elevated BP without diagnosis of hypertension - continue medications, DASH diet, exercise and monitor at home. Call if greater than 130/80.   Medication management - Magnesium  Screening for ischemic heart disease - EKG  Screening for thyroid disorder -TSH  Screening for hematuria or proteinuria -     Urinalysis, Routine w reflex microscopic -     Microalbumin / creatinine urine ratio  Over 40 minutes of exam, counseling, chart review and critical decision making was performed Future Appointments  Date Time Provider [place]  01/29/2022  2:00 PM Alycia Rossetti, NP [facility] None  09/13/2022  2:00 PM Alycia Rossetti, NP [facility] None  01/30/2023  2:00 PM Alycia Rossetti, NP [facility] None    Subjective:  Helen Davis is a 81 y.o. female who presents for CPE and 3 month follow up.   She is following with Dr. Oneida Alar for varicose veins, had ulcer on right lower leg that bleed recently, following with him. Going to get on compression socks.   Her blood pressure has been controlled at home, today their BP is   She does workout, walks with  chipy her dog, 2-3 mile walk. She denies chest pain, shortness of breath, dizziness.  She has had some palpitations, fatigue with her walking lately, had echo that showed severe MVR, has appointment with cardiology this month.  She has COPD, could not afford Symbicort, has albuterol and states with cooler weather it is better.  She is not on cholesterol medication and denies myalgias. Her cholesterol is at goal. The cholesterol last visit was:   Lab Results  Component Value Date   CHOL 215 (H) 09/12/2021   HDL 110 09/12/2021   LDLCALC 84 09/12/2021   TRIG 114 09/12/2021   CHOLHDL 2.0 09/12/2021    Last A1C in the office was:  Lab Results  Component Value Date   HGBA1C 5.4 09/12/2021   Patient is on Vitamin D supplement.   Lab Results  Component Value Date   VD25OH 71 01/26/2021     She is retired.   She is not going to counseling anymore. S/p double mastectomy, does self breast exams.  She has osteoporosis and has history of lumbar compression fracture.  BMI is There is no height or weight on file to calculate BMI., she is working on increasing her weight.  Wt Readings from Last 3 Encounters:  12/14/21 98 lb 4.8 oz (44.6 kg)  11/30/21 98 lb (44.5 kg)  09/20/21 100 lb 12.8 oz (45.7 kg)   Lab Results  Component Value Date   VITAMINB12 755 01/26/2021     Medication Review: Current Outpatient Medications on File Prior to Visit  Medication Sig Dispense Refill  Acetaminophen (TYLENOL EXTRA STRENGTH PO) Take by mouth.     apixaban (ELIQUIS) 5 MG TABS tablet TAKE ONE TABLET BY MOUTH TWICE A DAY 60 tablet 10   aspirin 81 MG chewable tablet Chew 1 tablet by mouth daily at 12 noon.     Bioflavonoid Products (ESTER-C) 500-550 MG TABS Take 500-1,000 mg by mouth daily.     Cholecalciferol (VITAMIN D PO) Take 4,000 Int'l Units by mouth daily.     Cyanocobalamin (VITAMIN B 12 PO) Take 1,000 mcg by mouth daily. Timed release     MAGNESIUM PO Take 500 mg by mouth at bedtime.      metoprolol tartrate (LOPRESSOR) 25 MG tablet Take 0.5 tablets (12.5 mg total) by mouth 2 (two) times daily. 90 tablet 3   Multiple Vitamins-Minerals (CENTRUM SILVER PO) Take 1 tablet by mouth daily. With iron     Zinc 30 MG TABS Take 30 mg by mouth daily.     No current facility-administered medications on file prior to visit.    Current Problems (verified) Patient Active Problem List   Diagnosis Date Noted   Pulmonary nodule 07/25/2021   Rupture of extensor tendon of hand, initial encounter 02/16/2021   Screening for ischemic heart disease 01/26/2021   Abnormal glucose 08/03/2020   Elevated BP without diagnosis of hypertension 08/03/2020   Unilateral primary osteoarthritis, right knee 05/13/2019   Varicose veins with ulcer, right ([place]) 02/16/2019   COPD (chronic obstructive pulmonary disease) ([place]) 12/09/2015   Mild malnutrition ([place]) 12/08/2015   DJD (degenerative joint disease) 07/28/2015   Mixed hyperlipidemia 11/15/2014   Osteoporosis 11/15/2014   Anemia    History of breast cancer    Vitamin D deficiency    Nonrheumatic mitral valve regurgitation 10/11/2009   Screening Tests Preventative care:  Immunization History  Administered Date(s) Administered   Pneumococcal Conjugate-13 02/12/2018   Pneumococcal Polysaccharide-23 02/16/2019   Td 09/12/2021   Tdap 09/18/2009   Last colonoscopy: 2013  Last mammogram: 1999 s/p bilateral mastectomy Last pap smear/pelvic exam: 2013 declines another DEXA: 2013 Osteoporosis, was on fosamax in the past (2002), declines repeat eval CXR 08/2017 Echo 2011  Prior vaccinations: TD or Tdap: 2011  Influenza: declines  Pneumococcal: 2020 Prevnar13:2019 Shingles/Zostavax: declines  Names of Other Physician/Practitioners you currently use: 1. [facility] here for primary care 2. Dr. Talbert Forest, eye doctor  3. Dr. Bobby Rumpf, dentist Patient Care Team: Unk Pinto, MD as PCP - General (Internal  Medicine) [name], MD as PCP - Cardiology (Cardiology) Darleen Crocker, MD as Consulting Physician (Ophthalmology) Richmond Campbell, MD as Consulting Physician (Gastroenterology) Druscilla Brownie, MD as Consulting Physician (Dermatology) Garald Balding, MD as Consulting Physician (Orthopedic Surgery) Unice Bailey, MD as Consulting Physician (Rheumatology) Victorino Dike, DDS as Consulting Physician (Dentistry) Larey Dresser, MD as Consulting Physician (Cardiology)  Allergies Allergies  Allergen Reactions   Covid-19 Mrna Vacc Levan Hurst) Rash    Patient states 1 week after moderna had severe blistering rash from left shoulder down to left elbow, will not get second shot    SURGICAL HISTORY She  has a past surgical history that includes Mastectomy; Appendectomy (1966); Varicose vein surgery (Right, 1975); TEE without cardioversion (N/A, 02/09/2021); RIGHT/LEFT HEART CATH AND CORONARY ANGIOGRAPHY (N/A, 05/05/2021); and Mitral valve replacement (05/29/2021). FAMILY HISTORY Her family history includes Atrial fibrillation in her mother; Cancer in an other family member; Dementia in her father; Emphysema in her father; Heart disease in her sister; Hypertension in an other family member; Stroke in  her mother and another family member. SOCIAL HISTORY She  reports that she quit smoking about 49 years ago. Her smoking use included cigarettes. She has never used smokeless tobacco. She reports current alcohol use of about 3.0 standard drinks of alcohol per week. She reports that she does not use drugs.   Review of Systems  Constitutional: Negative.   HENT: Negative.    Eyes: Negative.   Respiratory:  Positive for cough and sputum production (clear, not increased). Negative for hemoptysis, shortness of breath and wheezing.   Cardiovascular:  Positive for palpitations and leg swelling (has had for years, better in the AM, better with exercise). Negative for chest pain, orthopnea,  claudication and PND.  Gastrointestinal: Negative.   Genitourinary: Negative.   Musculoskeletal: Negative.   Skin: Negative.   Neurological: Negative.  Negative for dizziness.  Psychiatric/Behavioral:  Positive for depression (mild). Negative for hallucinations, memory loss, substance abuse and suicidal ideas. The patient is not nervous/anxious and does not have insomnia.    Objective:     There were no vitals taken for this visit. There is no height or weight on file to calculate BMI.  General appearance: alert, no distress, thin appearing, WD/WN, female HEENT: normocephalic, sclerae anicteric, TMs pearly, nares patent, no discharge or erythema, pharynx normal Oral cavity: MMM, no lesions Neck: supple, no lymphadenopathy, no thyromegaly, no masses Heart: RRR, normal S1, S2, with 4/6 holosystolic murmur Lungs: CTA bilaterally, no wheezes, rhonchi, or rales Abdomen: +bs, soft, non tender, non distended, no masses, no hepatomegaly, no splenomegaly Chest: s/p bilateral mastectomy without any lumps, barrel chest.  Musculoskeletal: nontender, no swelling, no obvious deformity Extremities: mild peripheral edema, no cyanosis, no clubbing Pulses: 2+ symmetric, upper and lower extremities, normal cap refill Neurological: alert, oriented x 3, CN2-12 intact, strength normal upper extremities and lower extremities, sensation normal throughout, DTRs 2+ throughout, no cerebellar signs, gait normal Psychiatric: normal affect, behavior normal, pleasant      Alycia Rossetti, NP   01/26/2022

## 2022-01-29 ENCOUNTER — Ambulatory Visit (INDEPENDENT_AMBULATORY_CARE_PROVIDER_SITE_OTHER): Payer: Medicare Other | Admitting: Nurse Practitioner

## 2022-01-29 ENCOUNTER — Encounter: Payer: Self-pay | Admitting: Nurse Practitioner

## 2022-01-29 VITALS — BP 102/60 | HR 78 | Temp 97.7°F | Ht 64.5 in | Wt 98.2 lb

## 2022-01-29 DIAGNOSIS — E441 Mild protein-calorie malnutrition: Secondary | ICD-10-CM

## 2022-01-29 DIAGNOSIS — Z79899 Other long term (current) drug therapy: Secondary | ICD-10-CM | POA: Diagnosis not present

## 2022-01-29 DIAGNOSIS — Z1389 Encounter for screening for other disorder: Secondary | ICD-10-CM

## 2022-01-29 DIAGNOSIS — J449 Chronic obstructive pulmonary disease, unspecified: Secondary | ICD-10-CM

## 2022-01-29 DIAGNOSIS — M810 Age-related osteoporosis without current pathological fracture: Secondary | ICD-10-CM

## 2022-01-29 DIAGNOSIS — Z0000 Encounter for general adult medical examination without abnormal findings: Secondary | ICD-10-CM | POA: Diagnosis not present

## 2022-01-29 DIAGNOSIS — M159 Polyosteoarthritis, unspecified: Secondary | ICD-10-CM

## 2022-01-29 DIAGNOSIS — E559 Vitamin D deficiency, unspecified: Secondary | ICD-10-CM | POA: Diagnosis not present

## 2022-01-29 DIAGNOSIS — R7309 Other abnormal glucose: Secondary | ICD-10-CM

## 2022-01-29 DIAGNOSIS — R030 Elevated blood-pressure reading, without diagnosis of hypertension: Secondary | ICD-10-CM

## 2022-01-29 DIAGNOSIS — I10 Essential (primary) hypertension: Secondary | ICD-10-CM | POA: Diagnosis not present

## 2022-01-29 DIAGNOSIS — Z853 Personal history of malignant neoplasm of breast: Secondary | ICD-10-CM

## 2022-01-29 DIAGNOSIS — Z136 Encounter for screening for cardiovascular disorders: Secondary | ICD-10-CM

## 2022-01-29 DIAGNOSIS — E782 Mixed hyperlipidemia: Secondary | ICD-10-CM

## 2022-01-29 DIAGNOSIS — M150 Primary generalized (osteo)arthritis: Secondary | ICD-10-CM

## 2022-01-29 DIAGNOSIS — Z1329 Encounter for screening for other suspected endocrine disorder: Secondary | ICD-10-CM

## 2022-01-29 DIAGNOSIS — I340 Nonrheumatic mitral (valve) insufficiency: Secondary | ICD-10-CM

## 2022-01-29 DIAGNOSIS — Z0001 Encounter for general adult medical examination with abnormal findings: Secondary | ICD-10-CM

## 2022-01-30 LAB — URINALYSIS, ROUTINE W REFLEX MICROSCOPIC
Bacteria, UA: NONE SEEN /HPF
Bilirubin Urine: NEGATIVE
Glucose, UA: NEGATIVE
Hgb urine dipstick: NEGATIVE
Hyaline Cast: NONE SEEN /LPF
Ketones, ur: NEGATIVE
Nitrite: NEGATIVE
Protein, ur: NEGATIVE
RBC / HPF: NONE SEEN /HPF (ref 0–2)
Specific Gravity, Urine: 1.01 (ref 1.001–1.035)
Squamous Epithelial / HPF: NONE SEEN /HPF (ref <=5)
pH: 7.5 (ref 5.0–8.0)

## 2022-01-30 LAB — LIPID PANEL
Cholesterol: 194 mg/dL (ref <200)
HDL: 92 mg/dL (ref >=50)
LDL Cholesterol (Calc): 83 mg/dL (calc)
Non-HDL Cholesterol (Calc): 102 mg/dL (calc) (ref <130)
Total CHOL/HDL Ratio: 2.1 (calc) (ref <5.0)
Triglycerides: 97 mg/dL (ref <150)

## 2022-01-30 LAB — CBC WITH DIFFERENTIAL/PLATELET
Absolute Monocytes: 756 cells/uL (ref 200–950)
Basophils Absolute: 70 cells/uL (ref 0–200)
Basophils Relative: 1.3 %
Eosinophils Absolute: 59 cells/uL (ref 15–500)
Eosinophils Relative: 1.1 %
HCT: 34 % — ABNORMAL LOW (ref 35.0–45.0)
Hemoglobin: 11.5 g/dL — ABNORMAL LOW (ref 11.7–15.5)
Lymphs Abs: 1301 cells/uL (ref 850–3900)
MCH: 32.4 pg (ref 27.0–33.0)
MCHC: 33.8 g/dL (ref 32.0–36.0)
MCV: 95.8 fL (ref 80.0–100.0)
MPV: 11 fL (ref 7.5–12.5)
Monocytes Relative: 14 %
Neutro Abs: 3213 cells/uL (ref 1500–7800)
Neutrophils Relative %: 59.5 %
Platelets: 271 10*3/uL (ref 140–400)
RBC: 3.55 10*6/uL — ABNORMAL LOW (ref 3.80–5.10)
RDW: 11.4 % (ref 11.0–15.0)
Total Lymphocyte: 24.1 %
WBC: 5.4 10*3/uL (ref 3.8–10.8)

## 2022-01-30 LAB — MAGNESIUM: Magnesium: 2.2 mg/dL (ref 1.5–2.5)

## 2022-01-30 LAB — VITAMIN D 25 HYDROXY (VIT D DEFICIENCY, FRACTURES): Vit D, 25-Hydroxy: 81 ng/mL (ref 30–100)

## 2022-01-30 LAB — COMPLETE METABOLIC PANEL WITH GFR
AG Ratio: 1.9 (calc) (ref 1.0–2.5)
ALT: 14 U/L (ref 6–29)
AST: 19 U/L (ref 10–35)
Albumin: 4 g/dL (ref 3.6–5.1)
Alkaline phosphatase (APISO): 69 U/L (ref 37–153)
BUN: 16 mg/dL (ref 7–25)
CO2: 28 mmol/L (ref 20–32)
Calcium: 9.3 mg/dL (ref 8.6–10.4)
Chloride: 100 mmol/L (ref 98–110)
Creat: 0.74 mg/dL (ref 0.60–1.00)
Globulin: 2.1 g/dL (calc) (ref 1.9–3.7)
Glucose, Bld: 83 mg/dL (ref 65–99)
Potassium: 4.6 mmol/L (ref 3.5–5.3)
Sodium: 132 mmol/L — ABNORMAL LOW (ref 135–146)
Total Bilirubin: 0.5 mg/dL (ref 0.2–1.2)
Total Protein: 6.1 g/dL (ref 6.1–8.1)
eGFR: 83 mL/min/{1.73_m2} (ref >=60)

## 2022-01-30 LAB — HEMOGLOBIN A1C
Hgb A1c MFr Bld: 5.1 % of total Hgb (ref <5.7)
Mean Plasma Glucose: 100 mg/dL
eAG (mmol/L): 5.5 mmol/L

## 2022-01-30 LAB — MICROALBUMIN / CREATININE URINE RATIO
Creatinine, Urine: 23 mg/dL (ref 20–275)
Microalb, Ur: <0.2 mg/dL

## 2022-01-30 LAB — TSH: TSH: 0.67 mIU/L (ref 0.40–4.50)

## 2022-02-13 ENCOUNTER — Ambulatory Visit (INDEPENDENT_AMBULATORY_CARE_PROVIDER_SITE_OTHER): Payer: Medicare Other

## 2022-02-13 ENCOUNTER — Encounter: Payer: Self-pay | Admitting: Orthopaedic Surgery

## 2022-02-13 ENCOUNTER — Ambulatory Visit: Payer: Medicare Other | Admitting: Orthopaedic Surgery

## 2022-02-13 DIAGNOSIS — M545 Low back pain, unspecified: Secondary | ICD-10-CM

## 2022-02-13 DIAGNOSIS — M4850XA Collapsed vertebra, not elsewhere classified, site unspecified, initial encounter for fracture: Secondary | ICD-10-CM | POA: Diagnosis not present

## 2022-02-13 NOTE — Progress Notes (Signed)
Office Visit Note   Patient: Helen Davis           Date of Birth: 1940-06-28           MRN: [id number] Visit Date: 02/13/2022              Requested by: Unk Pinto, [address],  [place] [address] PCP: Unk Pinto, MD   Assessment & Plan: Visit Diagnoses:  1. Acute bilateral low back pain without sciatica     Plan: Acute compression fracture lumbar spine.  There is a possibility of compression of both L3 and L4.  No prior films for comparison.  No referred pain lower extremity.  Lumbar support is helpful.  We will order a stat MRI scan and consider kyphoplasty  Follow-Up Instructions: Return Stat MRI scan lumbar spine.   Orders:  Orders Placed This Encounter  Procedures   XR Lumbar Spine 2-3 Views   MR Lumbar Spine w/o contrast   No orders of the defined types were placed in this encounter.     Procedures: No procedures performed   Clinical Data: No additional findings.   Subjective: Chief Complaint  Patient presents with   Lower Back - Pain  Acute onset of low back pain approximately 5 days ago without history of injury or trauma.  Pain is localized to the lower lumbar spine.  Has been able to apply the lumbar support to provide some relief.  Extra strength Tylenol is also been helpful.  No bowel or bladder problems.  Remote history of lumbar compression fracture about 8 years ago.  Prior films are not available  HPI  Review of Systems   Objective: Vital Signs: LMP  (LMP Unknown)   Physical Exam Constitutional:      Appearance: She is well-developed.  Eyes:     Pupils: Pupils are equal, round, and reactive to light.  Pulmonary:     Effort: Pulmonary effort is normal.  Skin:    General: Skin is warm and dry.  Neurological:     Mental Status: She is alert and oriented to person, place, and time.  Psychiatric:        Behavior: Behavior normal.     Ortho Exam awake alert and oriented x3.  Comfortable  sitting.  Some difficulty walking related to low back pain with a very erect spine.  Straight leg raise negative.  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  Motor exam appears to be intact.  Also has a history of osteoporosis treated with Fosamax in the past but nothing presently  Specialty Comments:  No specialty comments available.  Imaging: XR Lumbar Spine 2-3 Views  Result Date: 02/13/2022 Films of the lumbar spine obtained in 2 projections.  There is a left-sided lumbar scoliosis of over 15 degrees.  On the lateral film there appears to be a compression fracture of L3 and L4.  Much of the details obliterated by bowel gas there is a remote history of compression fracture but no prior films for comparison.  We will order an MRI scan    PMFS History: Patient Active Problem List   Diagnosis Date Noted   Pulmonary nodule 07/25/2021   Rupture of extensor tendon of hand, initial encounter 02/16/2021   Screening for ischemic heart disease 01/26/2021   Abnormal glucose 08/03/2020   Elevated BP without diagnosis of hypertension 08/03/2020   Unilateral primary osteoarthritis, right knee  05/13/2019   Varicose veins with ulcer, right ([place]) 02/16/2019   COPD (chronic obstructive pulmonary disease) ([place]) 12/09/2015   Mild malnutrition ([place]) 12/08/2015   DJD (degenerative joint disease) 07/28/2015   Mixed hyperlipidemia 11/15/2014   Osteoporosis 11/15/2014   Anemia    History of breast cancer    Vitamin D deficiency    Nonrheumatic mitral valve regurgitation 10/11/2009   Past Medical History:  Diagnosis Date   ABNORMAL EKG    Anemia    Breast cancer (HCC)    Fatigue    Mitral valve disorders(424.0)    Vitamin D deficiency     Family History  Problem Relation Age of Onset   Hypertension Other    Stroke Other    Cancer Other    Atrial fibrillation Mother    Stroke Mother    Emphysema Father     Dementia Father    Heart disease Sister        CHF    Past Surgical History:  Procedure Laterality Date   APPENDECTOMY  1966   MASTECTOMY     MITRAL VALVE REPLACEMENT  05/29/2021   RIGHT/LEFT HEART CATH AND CORONARY ANGIOGRAPHY N/A 05/05/2021   Procedure: RIGHT/LEFT HEART CATH AND CORONARY ANGIOGRAPHY;  Surgeon: Sherren Mocha, MD;  Location: [facility];  Service: Cardiovascular;  Laterality: N/A;   TEE WITHOUT CARDIOVERSION N/A 02/09/2021   Procedure: TRANSESOPHAGEAL ECHOCARDIOGRAM (TEE);  Surgeon: Sanda Klein, MD;  Location: [facility] ENDOS[facility];  Service: Cardiovascular;  Laterality: N/A;   VARICOSE VEIN SURGERY Right 1975   Social History   Occupational History   Not on file  Tobacco Use   Smoking status: Former    Types: Cigarettes    Quit date: 05/21/1972    Years since quitting: 49.7   Smokeless tobacco: Never  Substance and Sexual Activity   Alcohol use: Yes    Alcohol/week: 3.0 standard drinks of alcohol    Types: 3 Glasses of wine per week   Drug use: No   Sexual activity: Not on file     Garald Balding, MD   Note - This record has been created using [employer].  Chart creation errors have been sought, but may not always  have been located. Such creation errors do not reflect on  the standard of medical care.

## 2022-02-15 ENCOUNTER — Telehealth (HOSPITAL_BASED_OUTPATIENT_CLINIC_OR_DEPARTMENT_OTHER): Payer: Self-pay | Admitting: Cardiology

## 2022-02-15 DIAGNOSIS — I4819 Other persistent atrial fibrillation: Secondary | ICD-10-CM

## 2022-02-15 DIAGNOSIS — M545 Low back pain, unspecified: Secondary | ICD-10-CM | POA: Diagnosis not present

## 2022-02-15 NOTE — Telephone Encounter (Signed)
Left message for pt to call back. TDS

## 2022-02-15 NOTE — Telephone Encounter (Signed)
Pt c/o medication issue:  1. Name of Medication: Eliquis  2. How are you currently taking this medication (dosage and times per day)?   3. Are you having a reaction (difficulty breathing--STAT)?   4. What is your medication issue? She have fell in the donut hole for Eliquis and it too expensive, She wants to know if she can stop taking it and just take her Aspirin and Metoprolol

## 2022-02-15 NOTE — Telephone Encounter (Signed)
Per Dr. Harrell Gave last note "-has not had any palpitations off of amiodarone. 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 Both her mother and sister have had strokes. She does bruise easily but feels this is manageable. After shared decision making, will continue anticoagulation."  Would be reasonable to provide 2 weeks of samples as well as patient assistance paperwork.   Decision on Eliquis vs Aspirin would need to come from Dr. Harrell Gave. Will CC her so she is aware and can provide recommendations.   Loel Dubonnet, NP

## 2022-02-15 NOTE — Telephone Encounter (Signed)
Left message to call back  

## 2022-02-15 NOTE — Telephone Encounter (Signed)
To clarify: there is risk of stroke if the afib recurs and she is not on a blood thinner. We've discussed the pros/cons of this in the past.

## 2022-02-15 NOTE — Telephone Encounter (Signed)
Advised patient, vebalized understanding Patient assistance papers give and Elquis 5 mg #2 [id number] exp 4/25

## 2022-02-15 NOTE — Telephone Encounter (Signed)
Aspirin does not provide protection against strokes from afib. I would agree with giving samples and working on patient assistance, but if it is absolutely not a manageable cost for her, then she can stop the apixaban. Happy to see her back if she'd like to discuss more

## 2022-02-16 ENCOUNTER — Other Ambulatory Visit: Payer: Self-pay

## 2022-02-16 DIAGNOSIS — M545 Low back pain, unspecified: Secondary | ICD-10-CM

## 2022-02-17 ENCOUNTER — Other Ambulatory Visit: Payer: Medicare Other

## 2022-02-20 ENCOUNTER — Other Ambulatory Visit: Payer: Self-pay

## 2022-02-20 DIAGNOSIS — M810 Age-related osteoporosis without current pathological fracture: Secondary | ICD-10-CM

## 2022-02-21 ENCOUNTER — Telehealth (HOSPITAL_COMMUNITY): Payer: Self-pay

## 2022-02-21 MED ORDER — APIXABAN 5 MG PO TABS: 5.0000 mg | ORAL_TABLET | Freq: Two times a day (BID) | ORAL | 3 refills | Status: DC

## 2022-02-21 NOTE — Telephone Encounter (Signed)
Patient returned patient assistance, waiting for Dr Harrell Gave to sign

## 2022-02-21 NOTE — Addendum Note (Signed)
Addended by: Alvina Filbert B on: 02/21/2022 03:45 PM   Modules accepted: Orders

## 2022-02-21 NOTE — Telephone Encounter (Signed)
Called the office to let Gabriel Cirri know that we are waiting for pt to have her MRI to schedule kyphoplasty. I will have Dr. Estanislado Pandy review once her MRI has been done. AW

## 2022-03-05 NOTE — Telephone Encounter (Signed)
Patient assistance application faxed to [facility].

## 2022-03-11 ENCOUNTER — Encounter (HOSPITAL_BASED_OUTPATIENT_CLINIC_OR_DEPARTMENT_OTHER): Payer: Self-pay

## 2022-03-11 DIAGNOSIS — J0100 Acute maxillary sinusitis, unspecified: Secondary | ICD-10-CM

## 2022-03-12 ENCOUNTER — Other Ambulatory Visit: Payer: Self-pay | Admitting: Nurse Practitioner

## 2022-03-12 ENCOUNTER — Telehealth: Payer: Self-pay | Admitting: Nurse Practitioner

## 2022-03-12 ENCOUNTER — Telehealth: Payer: Self-pay | Admitting: Rheumatology

## 2022-03-12 DIAGNOSIS — J0100 Acute maxillary sinusitis, unspecified: Secondary | ICD-10-CM

## 2022-03-12 MED ORDER — AMOXICILLIN 500 MG PO TABS: ORAL_TABLET | ORAL | 0 refills | Status: DC

## 2022-03-12 NOTE — Telephone Encounter (Signed)
LMOM for patient to call and schedule earlier npt appointment with Hazel Sams, PA.

## 2022-03-12 NOTE — Telephone Encounter (Signed)
Please advise one what abx to send in.

## 2022-03-12 NOTE — Telephone Encounter (Signed)
Patient is having a dental procedure this Wednesday (03/14/2022). Dentist told her that due to her Mitral Valve Replacement surgery last year, that she would need an antibiotic before having this procedure. Will you send one into her pharmacy at [facility] on [address]?

## 2022-03-12 NOTE — Telephone Encounter (Signed)
-----   Message from Shona Needles, RT sent at 03/09/2022  5:32 PM EDT ----- Regarding: SCHEDULE NP WITH T[facility] Please call patient and schedule earlier new patient appt with Lovena Le, Dr. Rudene Anda cousin. OK per Lovena Le.

## 2022-03-14 NOTE — Progress Notes (Addendum)
Office Visit Note  Patient: Helen Davis             Date of Birth: 03/19/1941           MRN: [id number]             PCP: Unk Pinto, MD Referring: Garald Balding, MD Visit Date: 03/21/2022 Occupation: @GUAROCC @  Subjective:  Discuss osteoporosis treatment   History of Present Illness: Helen Davis is a 81 y.o. female who presents today for a new patient consultation per request of Dr. Durward Fortes.  Patient presents today to discuss osteoporosis treatment options.  She reports that 8 years ago she had a traumatic fall leading to an L4 vertebral fracture.  She states that she recently presented to Dr. Durward Fortes for evaluation of lower back pain and was diagnosed with an L3 compression fracture on 02/13/2022.  She had an MRI of the lumbar spine on 02/15/2022 for further evaluation and to determine if she was a candidate for kyphoplasty.  The vertebral fracture was confirmed but the patient decided to hold off on proceeding with a kyphoplasty.  Patient reports that the discomfort in her lower back has improved by 85%. Her last bone density was performed on 10/10/2009.  Patient reports that about 23 years ago she was on Fosamax for 1 year but discontinued due to increased reflux and gastritis.  She has not been on treatment for osteoporosis since then.  Patient reports that she has family history of osteoporosis and her sister is currently on Prolia.  She is interested in starting on Prolia but would like to discuss other treatment options as well.  She was also open to scheduling an updated bone density.   Activities of Daily Living:  Patient reports morning stiffness for 30 minutes.   Patient Denies nocturnal pain.  Difficulty dressing/grooming: Denies Difficulty climbing stairs: Denies Difficulty getting out of chair: Denies Difficulty using hands for taps, buttons, cutlery, and/or writing: Denies  Review of Systems  Constitutional:  Negative for fatigue.  HENT:  Negative  for mouth sores and mouth dryness.   Eyes:  Negative for dryness.  Respiratory:  Negative for shortness of breath.   Cardiovascular:  Negative for chest pain and palpitations.  Gastrointestinal:  Negative for blood in stool, constipation and diarrhea.  Endocrine: Negative for increased urination.  Genitourinary:  Negative for involuntary urination.  Musculoskeletal:  Positive for morning stiffness. Negative for joint pain, gait problem, joint pain, joint swelling, myalgias, muscle weakness, muscle tenderness and myalgias.  Skin:  Negative for color change, rash, hair loss and sensitivity to sunlight.  Allergic/Immunologic: Negative for susceptible to infections.  Neurological:  Negative for dizziness and headaches.  Hematological:  Positive for bruising/bleeding tendency. Negative for swollen glands.  Psychiatric/Behavioral:  Negative for depressed mood and sleep disturbance. The patient is not nervous/anxious.     PMFS History:  Patient Active Problem List   Diagnosis Date Noted   Non-traumatic compression fracture of vertebral column ([place]) 02/13/2022   Pulmonary nodule 07/25/2021   Rupture of extensor tendon of hand, initial encounter 02/16/2021   Screening for ischemic heart disease 01/26/2021   Abnormal glucose 08/03/2020   Elevated BP without diagnosis of hypertension 08/03/2020   Unilateral primary osteoarthritis, right knee 05/13/2019   Varicose veins with ulcer, right ([place]) 02/16/2019   COPD (chronic obstructive pulmonary disease) ([place]) 12/09/2015   Mild malnutrition ([place]) 12/08/2015   DJD (degenerative joint disease) 07/28/2015   Mixed hyperlipidemia 11/15/2014   Osteoporosis 11/15/2014  Anemia    History of breast cancer    Vitamin D deficiency    Nonrheumatic mitral valve regurgitation 10/11/2009    Past Medical History:  Diagnosis Date   ABNORMAL EKG    Anemia    Breast cancer ([place])    Fatigue    Mitral valve disorders(424.0)    Vitamin D deficiency      Family History  Problem Relation Age of Onset   Atrial fibrillation Mother    Stroke Mother    Emphysema Father    Dementia Father    Heart disease Sister        CHF   Hypertension Other    Stroke Other    Cancer Other    Healthy Son    Healthy Son    Past Surgical History:  Procedure Laterality Date   APPENDECTOMY  1966   MASTECTOMY     MITRAL VALVE REPLACEMENT  05/29/2021   RIGHT/LEFT HEART CATH AND CORONARY ANGIOGRAPHY N/A 05/05/2021   Procedure: RIGHT/LEFT HEART CATH AND CORONARY ANGIOGRAPHY;  Surgeon: Sherren Mocha, MD;  Location: [facility];  Service: Cardiovascular;  Laterality: N/A;   TEE WITHOUT CARDIOVERSION N/A 02/09/2021   Procedure: TRANSESOPHAGEAL ECHOCARDIOGRAM (TEE);  Surgeon: Sanda Klein, MD;  Location: [facility];  Service: Cardiovascular;  Laterality: N/A;   [place] Right 1975   Social History   Social History Narrative   Not on file   Immunization History  Administered Date(s) Administered   Pneumococcal Conjugate-13 02/12/2018   Pneumococcal Polysaccharide-23 02/16/2019   Td 09/12/2021   Tdap 09/18/2009     Objective: Vital Signs: BP 117/85 (BP Location: Left Arm, Patient Position: Sitting, Cuff Size: Normal)   Pulse 69   Resp 13   Ht 5' 2.25" (1.581 m)   Wt 98 lb (44.5 kg)   LMP  (LMP Unknown)   BMI 17.78 kg/m    Physical Exam Vitals and nursing note reviewed.  Constitutional:      Appearance: She is well-developed.  HENT:     Head: Normocephalic and atraumatic.  Eyes:     Conjunctiva/sclera: Conjunctivae normal.  Cardiovascular:     Rate and Rhythm: Normal rate and regular rhythm.     Heart sounds: Normal heart sounds.  Pulmonary:     Effort: Pulmonary effort is normal.     Breath sounds: Normal breath sounds.  Abdominal:     General: Bowel sounds are normal.     Palpations: Abdomen is soft.  Musculoskeletal:     Cervical back: Normal range of motion.  Skin:    General: Skin is warm and dry.      Capillary Refill: Capillary refill takes less than 2 seconds.  Neurological:     Mental Status: She is alert and oriented to person, place, and time.  Psychiatric:        Behavior: Behavior normal.     Musculoskeletal Exam: C-spine has good ROM.  Thoracic kyphosis noted.  Midline spinal tenderness in the lumbar region.  Shoulder joints and elbow joints have good ROM.  Limited ROM of both wrist joints with synovial thickening.  Severe CMC arthritis noted. PIP and DIP thickening consistent with osteoarthritis.  Hip joint have good ROM with no groin pain.  Knee joints have good ROM with no warmth or effusion.  Ankle joints have good ROM with no tenderness or joint swelling.   CDAI Exam: CDAI Score: -- Patient Global: --; Provider Global: -- Swollen: --; Tender: -- Joint Exam 03/21/2022   No  joint exam has been documented for this visit   There is currently no information documented on the homunculus. Go to the Rheumatology activity and complete the homunculus joint exam.  Investigation: No additional findings.  Imaging: No results found.  Recent Labs: Lab Results  Component Value Date   WBC 5.4 01/29/2022   HGB 11.5 (L) 01/29/2022   PLT 271 01/29/2022   NA 132 (L) 01/29/2022   K 4.6 01/29/2022   CL 100 01/29/2022   CO2 28 01/29/2022   GLUCOSE 83 01/29/2022   BUN 16 01/29/2022   CREATININE 0.74 01/29/2022   BILITOT 0.5 01/29/2022   ALKPHOS 96 07/10/2021   AST 19 01/29/2022   ALT 14 01/29/2022   PROT 6.1 01/29/2022   ALBUMIN 4.1 07/10/2021   CALCIUM 9.3 01/29/2022   GFRAA 99 08/04/2020    Speciality Comments: No specialty comments available.  Procedures:  No procedures performed Allergies: Covid-19 mrna vacc (moderna)    Assessment / Plan:     Visit Diagnoses: Age-related osteoporosis without current pathological fracture -  Patient presents today to discuss treatment options for management of osteoporosis.  Her last bone density was performed on 10/10/2009-severe  osteoporosis.  The patient has a known history of an L4 compression fracture 8 years ago after a traumatic fall.  She had a recent acute nontraumatic L3 compression fracture confirmed on x-ray on 02/13/2022 and an MRI of the lumbar spine on 02/15/2022. MRI of lumbar spine was reviewed today: Acute mild compression fracture of L3 superior endplate without bony retropulsion.  As shaped scoliosis with associated disc degeneration most advanced at T12-L1 and L1-L2.  Chronic compression deformity of L4 vertebral body. The patient decided against proceeding with a kyphoplasty.  Her lower back pain has improved by 85%. Patient was previously on Fosamax for 1 year but discontinued due to GI side effects about 23 years ago.  She has not been on any other treatment for osteoporosis. She has a known family history of osteoporosis.  Her sister is currently on Prolia.  She is familiar with Prolia but we discussed medication treatment options today in detail.   Patient has history of breast cancer-will avoid forteo and tymlos. Apprehensive for evenity due to history of afib and s/p mitral valve replacement.  Knox Saliva, RPH will also reach out to the patient to clarify if she has any further questions.  An updated DEXA will be ordered today for further evaluation. Vitamin D 81 on 01/29/22.  The following lab work will also be obtained prior to initiating therapy.  She will follow-up in the office in 2 to 3 weeks and we will review the DEXA results as well as finalize a treatment plan.- Plan: Parathyroid hormone, intact (no Ca), Phosphorus, Serum protein electrophoresis with reflex, CBC with Differential/Platelet, COMPLETE METABOLIC PANEL WITH GFR, DG Bone Density  Medication monitoring encounter -Lab work from 01/29/22 was reviewed today in the office: vitamin D 81, TSH 0.67, Mg 2.2, CMP: creatinine 0.74, eGFR 83, and calcium 9.3. The following lab work was ordered today prior to initiating therapy.  Plan: Parathyroid  hormone, intact (no Ca), Phosphorus, Serum protein electrophoresis with reflex, CBC with Differential/Platelet, COMPLETE METABOLIC PANEL WITH GFR  History of compression fracture of spine - Lumbar XR 02/13/22: compression fracture of L3 and L4. MRI lumbar spine ordered by Dr. Durward Fortes on 02/15/22: acute L3 and chronic L4 compression fractures.  Patient decided against proceeding with a kyphoplasty. DEXA order placed today. - Plan: DG Bone Density  History of  scoliosis - S shaped scoliosis noted on MRI from 02/15/22.   Unilateral primary osteoarthritis, right knee: Under care of Dr. Durward Fortes. She has had a cortisone injection in the past.   Other medical conditions are listed as follows:   S/P MVR (mitral valve repair): Dr. Cheree Ditto 06/19/21.   Varicose veins with ulcer, right (HCC)  Pulmonary nodule  History of COPD  Mixed hyperlipidemia  Mild malnutrition (HCC)  History of breast cancer  Elevated BP without diagnosis of hypertension: BP was 117/85 today in the office.     Orders: Orders Placed This Encounter  Procedures   DG Bone Density   Parathyroid hormone, intact (no Ca)   Phosphorus   Serum protein electrophoresis with reflex   CBC with Differential/Platelet   COMPLETE METABOLIC PANEL WITH GFR   No orders of the defined types were placed in this encounter.    Follow-Up Instructions: Return for Osteoporosis.   Ofilia Neas, PA-C  Note - This record has been created using Dragon software.  Chart creation errors have been sought, but may not always  have been located. Such creation errors do not reflect on  the standard of medical care.

## 2022-03-19 ENCOUNTER — Encounter (HOSPITAL_BASED_OUTPATIENT_CLINIC_OR_DEPARTMENT_OTHER): Payer: Self-pay

## 2022-03-19 NOTE — Telephone Encounter (Signed)
Pt sent mychart stating she was approved for Eliquis patient assistance.

## 2022-03-21 ENCOUNTER — Ambulatory Visit: Payer: Medicare Other | Attending: Rheumatology | Admitting: Physician Assistant

## 2022-03-21 ENCOUNTER — Telehealth: Payer: Self-pay | Admitting: Cardiology

## 2022-03-21 ENCOUNTER — Telehealth: Payer: Self-pay | Admitting: Pharmacist

## 2022-03-21 ENCOUNTER — Encounter: Payer: Self-pay | Admitting: Physician Assistant

## 2022-03-21 VITALS — BP 117/85 | HR 69 | Resp 13 | Ht 62.25 in | Wt 98.0 lb

## 2022-03-21 DIAGNOSIS — Z8781 Personal history of (healed) traumatic fracture: Secondary | ICD-10-CM | POA: Diagnosis not present

## 2022-03-21 DIAGNOSIS — Z8739 Personal history of other diseases of the musculoskeletal system and connective tissue: Secondary | ICD-10-CM | POA: Diagnosis not present

## 2022-03-21 DIAGNOSIS — I83019 Varicose veins of right lower extremity with ulcer of unspecified site: Secondary | ICD-10-CM

## 2022-03-21 DIAGNOSIS — Z5181 Encounter for therapeutic drug level monitoring: Secondary | ICD-10-CM

## 2022-03-21 DIAGNOSIS — R911 Solitary pulmonary nodule: Secondary | ICD-10-CM

## 2022-03-21 DIAGNOSIS — M1711 Unilateral primary osteoarthritis, right knee: Secondary | ICD-10-CM

## 2022-03-21 DIAGNOSIS — L97919 Non-pressure chronic ulcer of unspecified part of right lower leg with unspecified severity: Secondary | ICD-10-CM

## 2022-03-21 DIAGNOSIS — M810 Age-related osteoporosis without current pathological fracture: Secondary | ICD-10-CM | POA: Diagnosis not present

## 2022-03-21 DIAGNOSIS — I340 Nonrheumatic mitral (valve) insufficiency: Secondary | ICD-10-CM

## 2022-03-21 DIAGNOSIS — R030 Elevated blood-pressure reading, without diagnosis of hypertension: Secondary | ICD-10-CM

## 2022-03-21 DIAGNOSIS — Z8709 Personal history of other diseases of the respiratory system: Secondary | ICD-10-CM

## 2022-03-21 DIAGNOSIS — Z9889 Other specified postprocedural states: Secondary | ICD-10-CM

## 2022-03-21 DIAGNOSIS — E441 Mild protein-calorie malnutrition: Secondary | ICD-10-CM

## 2022-03-21 DIAGNOSIS — E782 Mixed hyperlipidemia: Secondary | ICD-10-CM

## 2022-03-21 DIAGNOSIS — Z853 Personal history of malignant neoplasm of breast: Secondary | ICD-10-CM

## 2022-03-21 DIAGNOSIS — I4819 Other persistent atrial fibrillation: Secondary | ICD-10-CM

## 2022-03-21 MED ORDER — APIXABAN 5 MG PO TABS: 5.0000 mg | ORAL_TABLET | Freq: Two times a day (BID) | ORAL | 1 refills | Status: DC

## 2022-03-21 NOTE — Telephone Encounter (Signed)
Prescription refill request for Eliquis received. Indication: Afib  Last office visit: 12/14/21 Harrell Gave)  Scr: 0.74 (01/29/22)  Age: 81 Weight: 44.5kg  Appropriate dose and refill sent to requested pharmacy.

## 2022-03-21 NOTE — Telephone Encounter (Addendum)
Please start Prolia BIV through medical benefit and pharmacy benefit so we can determine most cost-effective option  Prolia - [id number], [phone]  Dose: '60mg'$  SQ every 6 months  Dx: Age-related osteoporosis (M80.0)  She will need to get updated bone density before starting  Knox Saliva, PharmD, MPH, BCPS, CPP Clinical Pharmacist (Rheumatology and Pulmonology)

## 2022-03-21 NOTE — Addendum Note (Signed)
Addended by: Leonidas Romberg on: 03/21/2022 02:47 PM   Modules accepted: Orders

## 2022-03-21 NOTE — Telephone Encounter (Signed)
Please review for refill. Thank you! 

## 2022-03-21 NOTE — Telephone Encounter (Signed)
*  STAT* If patient is at the pharmacy, call can be transferred to refill team.   1. Which medications need to be refilled? (please list name of each medication and dose if known) apixaban (ELIQUIS) 5 MG TABS tablet  2. Which pharmacy/location (including street and city if local pharmacy) is medication to be sent to?   [facility]  [address] [place] [address]  NCPDP/NABP: [id number]  NPI: [id number]   Phone Number: [phone]  Fax Number: [phone]   3. Do they need a 30 day or 90 day supply? Pharmacy requesting a 90 day supply instead of 45

## 2022-03-22 ENCOUNTER — Ambulatory Visit (HOSPITAL_BASED_OUTPATIENT_CLINIC_OR_DEPARTMENT_OTHER)
Admission: RE | Admit: 2022-03-22 | Discharge: 2022-03-22 | Disposition: A | Discharge status: Home / Self Care | Payer: Medicare Other | Source: Ambulatory Visit | Attending: Physician Assistant | Admitting: Physician Assistant

## 2022-03-22 DIAGNOSIS — Z8781 Personal history of (healed) traumatic fracture: Secondary | ICD-10-CM

## 2022-03-22 DIAGNOSIS — M810 Age-related osteoporosis without current pathological fracture: Secondary | ICD-10-CM | POA: Diagnosis not present

## 2022-03-22 NOTE — Progress Notes (Signed)
Patient has severe osteoporosis with history of vertebral fractures.  She will benefit from initiating Forteo.  We will discuss DEXA results and treatment options in detail at her new patient follow-up visit on 04/04/2022.

## 2022-03-22 NOTE — Progress Notes (Signed)
PTH WNL

## 2022-03-22 NOTE — Progress Notes (Signed)
Sodium is borderline low but trending up. Rest of CMP WNL.  CBC WNL.  Phosphorus WNL.   SPEP and PTH are pending.

## 2022-03-26 ENCOUNTER — Other Ambulatory Visit (HOSPITAL_COMMUNITY): Payer: Self-pay

## 2022-03-26 ENCOUNTER — Telehealth: Payer: Self-pay | Admitting: Pharmacy Technician

## 2022-03-26 NOTE — Telephone Encounter (Signed)
Patient Advocate Encounter  Received notification from [facility] that prior authorization for PROLIA is required.   PA submitted on 11.6.23 Key [id number] Status is pending    Luciano Cutter, CPhT Patient Advocate Phone: [phone]

## 2022-03-27 ENCOUNTER — Other Ambulatory Visit (HOSPITAL_COMMUNITY): Payer: Self-pay

## 2022-03-27 NOTE — Telephone Encounter (Signed)
Patient Advocate Encounter  Prior Authorization for [place] has been approved.    PA# --- Effective dates: 11.6.23 through 11.6.24  Magan Winnett B. CPhT P: [phone] F: [phone]  Pt is in coverage gap with a current balance of $1559.56.   Received notification from [facility] that prior authorization for [place] is required.   PA submitted on 11.7.23 Key [id number] Status is pending    Luciano Cutter, CPhT Patient Advocate Phone: [phone]

## 2022-03-29 LAB — COMPLETE METABOLIC PANEL WITH GFR
AG Ratio: 1.7 (calc) (ref 1.0–2.5)
ALT: 19 U/L (ref 6–29)
AST: 25 U/L (ref 10–35)
Albumin: 4 g/dL (ref 3.6–5.1)
Alkaline phosphatase (APISO): 72 U/L (ref 37–153)
BUN: 13 mg/dL (ref 7–25)
CO2: 28 mmol/L (ref 20–32)
Calcium: 9.5 mg/dL (ref 8.6–10.4)
Chloride: 100 mmol/L (ref 98–110)
Creat: 0.67 mg/dL (ref 0.60–1.00)
Globulin: 2.4 g/dL (calc) (ref 1.9–3.7)
Glucose, Bld: 85 mg/dL (ref 65–99)
Potassium: 4.8 mmol/L (ref 3.5–5.3)
Sodium: 133 mmol/L — ABNORMAL LOW (ref 135–146)
Total Bilirubin: 0.7 mg/dL (ref 0.2–1.2)
Total Protein: 6.4 g/dL (ref 6.1–8.1)
eGFR: 89 mL/min/{1.73_m2} (ref >=60)

## 2022-03-29 LAB — PROTEIN ELECTROPHORESIS, SERUM, WITH REFLEX
Abnormal Protein Band1: 0.4 g/dL — ABNORMAL HIGH
Abnormal Protein Band2: 0.2 g/dL — ABNORMAL HIGH
Abnormal Protein Band3: 0.2 g/dL — ABNORMAL HIGH
Albumin ELP: 4 g/dL (ref 3.8–4.8)
Alpha 1: 0.2 g/dL (ref 0.2–0.3)
Alpha 2: 0.6 g/dL (ref 0.5–0.9)
Beta 2: 0.3 g/dL (ref 0.2–0.5)
Beta Globulin: 0.4 g/dL (ref 0.4–0.6)
Gamma Globulin: 1 g/dL (ref 0.8–1.7)
Total Protein: 6.4 g/dL (ref 6.1–8.1)

## 2022-03-29 LAB — CBC WITH DIFFERENTIAL/PLATELET
Absolute Monocytes: 635 cells/uL (ref 200–950)
Basophils Absolute: 70 cells/uL (ref 0–200)
Basophils Relative: 1.4 %
Eosinophils Absolute: 60 cells/uL (ref 15–500)
Eosinophils Relative: 1.2 %
HCT: 36.7 % (ref 35.0–45.0)
Hemoglobin: 12 g/dL (ref 11.7–15.5)
Lymphs Abs: 1220 cells/uL (ref 850–3900)
MCH: 31.6 pg (ref 27.0–33.0)
MCHC: 32.7 g/dL (ref 32.0–36.0)
MCV: 96.6 fL (ref 80.0–100.0)
MPV: 10.9 fL (ref 7.5–12.5)
Monocytes Relative: 12.7 %
Neutro Abs: 3015 cells/uL (ref 1500–7800)
Neutrophils Relative %: 60.3 %
Platelets: 316 10*3/uL (ref 140–400)
RBC: 3.8 10*6/uL (ref 3.80–5.10)
RDW: 11.5 % (ref 11.0–15.0)
Total Lymphocyte: 24.4 %
WBC: 5 10*3/uL (ref 3.8–10.8)

## 2022-03-29 LAB — IFE INTERPRETATION

## 2022-03-29 LAB — PHOSPHORUS: Phosphorus: 3.5 mg/dL (ref 2.1–4.3)

## 2022-03-29 LAB — PARATHYROID HORMONE, INTACT (NO CA): PTH: 47 pg/mL (ref 16–77)

## 2022-03-29 NOTE — Progress Notes (Signed)
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  Please refer to hematology.

## 2022-03-30 ENCOUNTER — Other Ambulatory Visit: Payer: Self-pay

## 2022-03-30 DIAGNOSIS — R778 Other specified abnormalities of plasma proteins: Secondary | ICD-10-CM

## 2022-03-30 NOTE — Telephone Encounter (Signed)
Received fax from [facility] regarding MEDICAL benefits for Prolia (sent to media tab of patient's chart). Prior authorization is required.  Phone: [phone], option 1  Called [place] [employer] to initiate authorization for Prolia [phone]).  Effective date 05/21/2021 and is currently active. [facility] is in network. No annual deductible. There is $3950 OOP max. Patient has paid $2161.99 as of 03/30/22. There is 20% coinsurance for Part B drugs. For Y1712, authorization is not required.  Call ref # [place] 03/30/22  Referral to heme/onc placed today for abnormal SPEP. Will discuss with Dr. Estanislado Pandy if okay to move forward with Proia.  Knox Saliva, PharmD, MPH, BCPS, CPP Clinical Pharmacist (Rheumatology and Pulmonology)

## 2022-04-03 NOTE — Progress Notes (Unsigned)
Office Visit Note  Patient: Helen Davis             Date of Birth: 11-Jul-1940           MRN: [id number]             PCP: Unk Pinto, MD Referring: Unk Pinto, MD Visit Date: 04/04/2022 Occupation: '@GUAROCC'$ @  Subjective:  Discuss treatment options   History of Present Illness: Helen Davis is a 81 y.o. female with history of osteoporosis and osteoarthritis.   Activities of Daily Living:  Patient reports morning stiffness for *** {minute/hour:19697}.   Patient {ACTIONS;DENIES/REPORTS:21021675::"Denies"} nocturnal pain.  Difficulty dressing/grooming: {ACTIONS;DENIES/REPORTS:21021675::"Denies"} Difficulty climbing stairs: {ACTIONS;DENIES/REPORTS:21021675::"Denies"} Difficulty getting out of chair: {ACTIONS;DENIES/REPORTS:21021675::"Denies"} Difficulty using hands for taps, buttons, cutlery, and/or writing: {ACTIONS;DENIES/REPORTS:21021675::"Denies"}  No Rheumatology ROS completed.   PMFS History:  Patient Active Problem List   Diagnosis Date Noted   Non-traumatic compression fracture of vertebral column ([place]) 02/13/2022   Pulmonary nodule 07/25/2021   Rupture of extensor tendon of hand, initial encounter 02/16/2021   Screening for ischemic heart disease 01/26/2021   Abnormal glucose 08/03/2020   Elevated BP without diagnosis of hypertension 08/03/2020   Unilateral primary osteoarthritis, right knee 05/13/2019   Varicose veins with ulcer, right ([place]) 02/16/2019   COPD (chronic obstructive pulmonary disease) ([place]) 12/09/2015   Mild malnutrition ([place]) 12/08/2015   DJD (degenerative joint disease) 07/28/2015   Mixed hyperlipidemia 11/15/2014   Osteoporosis 11/15/2014   Anemia    History of breast cancer    Vitamin D deficiency    Nonrheumatic mitral valve regurgitation 10/11/2009    Past Medical History:  Diagnosis Date   ABNORMAL EKG    Anemia    Breast cancer (HCC)    Fatigue    Mitral valve disorders(424.0)    Vitamin D deficiency     Family  History  Problem Relation Age of Onset   Atrial fibrillation Mother    Stroke Mother    Emphysema Father    Dementia Father    Heart disease Sister        CHF   Hypertension Other    Stroke Other    Cancer Other    Healthy Son    Healthy Son    Past Surgical History:  Procedure Laterality Date   APPENDECTOMY  1966   MASTECTOMY     MITRAL VALVE REPLACEMENT  05/29/2021   RIGHT/LEFT HEART CATH AND CORONARY ANGIOGRAPHY N/A 05/05/2021   Procedure: RIGHT/LEFT HEART CATH AND CORONARY ANGIOGRAPHY;  Surgeon: Sherren Mocha, MD;  Location: [facility];  Service: Cardiovascular;  Laterality: N/A;   TEE WITHOUT CARDIOVERSION N/A 02/09/2021   Procedure: TRANSESOPHAGEAL ECHOCARDIOGRAM (TEE);  Surgeon: Sanda Klein, MD;  Location: [facility];  Service: Cardiovascular;  Laterality: N/A;   [place] Right 1975   Social History   Social History Narrative   Not on file   Immunization History  Administered Date(s) Administered   Pneumococcal Conjugate-13 02/12/2018   Pneumococcal Polysaccharide-23 02/16/2019   Td 09/12/2021   Tdap 09/18/2009     Objective: Vital Signs: LMP  (LMP Unknown)    Physical Exam Vitals and nursing note reviewed.  Constitutional:      Appearance: She is well-developed.  HENT:     Head: Normocephalic and atraumatic.  Eyes:     Conjunctiva/sclera: Conjunctivae normal.  Cardiovascular:     Rate and Rhythm: Normal rate and regular rhythm.     Heart sounds: Normal heart sounds.  Pulmonary:     Effort:  Pulmonary effort is normal.     Breath sounds: Normal breath sounds.  Abdominal:     General: Bowel sounds are normal.     Palpations: Abdomen is soft.  Musculoskeletal:     Cervical back: Normal range of motion.  Skin:    General: Skin is warm and dry.     Capillary Refill: Capillary refill takes less than 2 seconds.  Neurological:     Mental Status: She is alert and oriented to person, place, and time.  Psychiatric:         Behavior: Behavior normal.      Musculoskeletal Exam: ***  CDAI Exam: CDAI Score: -- Patient Global: --; Provider Global: -- Swollen: --; Tender: -- Joint Exam 04/04/2022   No joint exam has been documented for this visit   There is currently no information documented on the homunculus. Go to the Rheumatology activity and complete the homunculus joint exam.  Investigation: No additional findings.  Imaging: DG Bone Density  Result Date: 03/22/2022 EXAM: DUAL X-RAY ABSORPTIOMETRY (DXA) FOR BONE MINERAL DENSITY IMPRESSION: Referring Physician:  Ofilia Neas Your patient completed a bone mineral density test using GE Lunar iDXA system (analysis version: 16). Technologist: ALW PATIENT: Name: Helen Davis, Helen Davis Patient ID: [id number] Birth Date: Dec 08, 1940 Height: 62.2 in. Sex: Female Measured: 03/22/2022 Weight: 98.0 lbs. Indications: Advanced Age, Breast Cancer History, Caucasian, Estrogen Deficiency, Family Hist. (Parent hip fracture), Height Loss, History of Fracture (Adult), History of osteoporosis, Post Menopausal Fractures: Vertebra, Wrist Treatments: Multivitamin, Vitamin D ASSESSMENT: The BMD measured at Left Forearm Radius 33% is 0.395 g/cm2 with a T-score of -5.5. This patient is considered osteoporotic according to [facility]) criteria. The scan quality is good. Lumbar Spine excluded due to degenerative changes. Site Region Measured Date Measured Age YA BMD Significant CHANGE T-score Left Forearm Radius 33% 03/22/2022 79.1 -5.5 0.395 g/cm2 DualFemur Total Right 03/22/2022 79.1 -5.3 0.342 g/cm2 DualFemur Total Mean 03/22/2022 79.1 -4.3 0.468 g/cm2 World Health Organization [facility]) criteria for post-menopausal, Caucasian Women: Normal       T-score at or above -1 SD Osteopenia   T-score between -1 and -2.5 SD Osteoporosis T-score at or below -2.5 SD RECOMMENDATION: 1. All patients should optimize calcium and vitamin D intake. 2. Consider FDA approved medical therapies in  postmenopausal women and men aged 36 years and older, based on the following: a. A hip or vertebral (clinical or morphometric) fracture b. T-score = -2.5 at the femoral neck or spine after appropriate evaluation to exclude secondary causes c. Low bone mass (T-score between -1.0 and -2.5 at the femoral neck or spine) and a 10- year probability of a hip fracture = 3% or a 10 year probability of a major osteoporosis-related fracture = 20% based on the [place]-adapted WHO algorithm. 3. Clinician judgement and/or patient preference may indicate treatment for people with 10-year fracture probabilities above or below these levels. FOLLOW-UP: Patients with diagnosis of osteoporosis or at high risk for fracture should have regular bone mineral density tests. For patients eligible for Medicare routine testing is allowed once every 2 years. The testing frequency can be increased to one year for patients who have rapidly progressing disease, those who are receiving or discontinuing medical therapy to restore bone mass, or have additional risk factors. I have reviewed this study and agree with the findings. [facility] Electronically Signed   By: Ammie Ferrier M.D.   On: 03/22/2022 15:17    Recent Labs: Lab Results  Component Value Date  WBC 5.0 03/21/2022   HGB 12.0 03/21/2022   PLT 316 03/21/2022   NA 133 (L) 03/21/2022   K 4.8 03/21/2022   CL 100 03/21/2022   CO2 28 03/21/2022   GLUCOSE 85 03/21/2022   BUN 13 03/21/2022   CREATININE 0.67 03/21/2022   BILITOT 0.7 03/21/2022   ALKPHOS 96 07/10/2021   AST 25 03/21/2022   ALT 19 03/21/2022   PROT 6.4 03/21/2022   PROT 6.4 03/21/2022   ALBUMIN 4.1 07/10/2021   CALCIUM 9.5 03/21/2022   GFRAA 99 08/04/2020    Speciality Comments: No specialty comments available.  Procedures:  No procedures performed Allergies: Covid-19 mrna vacc (moderna)   Assessment / Plan:     Visit Diagnoses: Age-related osteoporosis without current pathological  fracture  Medication monitoring encounter  History of compression fracture of spine  History of scoliosis  Unilateral primary osteoarthritis, right knee  History of breast cancer  Abnormal SPEP  Varicose veins with ulcer, right (HCC)  Pulmonary nodule  History of COPD  Mixed hyperlipidemia  Mild malnutrition (HCC)  Elevated BP without diagnosis of hypertension  S/P MVR (mitral valve repair)  Orders: No orders of the defined types were placed in this encounter.  No orders of the defined types were placed in this encounter.   Face-to-face time spent with patient was *** minutes. Greater than 50% of time was spent in counseling and coordination of care.  Follow-Up Instructions: No follow-ups on file.   Ofilia Neas, PA-C  Note - This record has been created using Dragon software.  Chart creation errors have been sought, but may not always  have been located. Such creation errors do not reflect on  the standard of medical care.

## 2022-04-04 ENCOUNTER — Encounter: Payer: Self-pay | Admitting: Physician Assistant

## 2022-04-04 ENCOUNTER — Ambulatory Visit: Payer: Medicare Other | Attending: Physician Assistant | Admitting: Physician Assistant

## 2022-04-04 VITALS — BP 133/77 | HR 69 | Ht 62.0 in | Wt 99.6 lb

## 2022-04-04 DIAGNOSIS — M810 Age-related osteoporosis without current pathological fracture: Secondary | ICD-10-CM | POA: Diagnosis not present

## 2022-04-04 DIAGNOSIS — E441 Mild protein-calorie malnutrition: Secondary | ICD-10-CM

## 2022-04-04 DIAGNOSIS — Z5181 Encounter for therapeutic drug level monitoring: Secondary | ICD-10-CM | POA: Diagnosis not present

## 2022-04-04 DIAGNOSIS — L97919 Non-pressure chronic ulcer of unspecified part of right lower leg with unspecified severity: Secondary | ICD-10-CM

## 2022-04-04 DIAGNOSIS — Z8709 Personal history of other diseases of the respiratory system: Secondary | ICD-10-CM

## 2022-04-04 DIAGNOSIS — Z8739 Personal history of other diseases of the musculoskeletal system and connective tissue: Secondary | ICD-10-CM

## 2022-04-04 DIAGNOSIS — R911 Solitary pulmonary nodule: Secondary | ICD-10-CM

## 2022-04-04 DIAGNOSIS — Z853 Personal history of malignant neoplasm of breast: Secondary | ICD-10-CM

## 2022-04-04 DIAGNOSIS — R030 Elevated blood-pressure reading, without diagnosis of hypertension: Secondary | ICD-10-CM

## 2022-04-04 DIAGNOSIS — R778 Other specified abnormalities of plasma proteins: Secondary | ICD-10-CM

## 2022-04-04 DIAGNOSIS — Z9889 Other specified postprocedural states: Secondary | ICD-10-CM

## 2022-04-04 DIAGNOSIS — I83019 Varicose veins of right lower extremity with ulcer of unspecified site: Secondary | ICD-10-CM

## 2022-04-04 DIAGNOSIS — M1711 Unilateral primary osteoarthritis, right knee: Secondary | ICD-10-CM

## 2022-04-04 DIAGNOSIS — Z8781 Personal history of (healed) traumatic fracture: Secondary | ICD-10-CM | POA: Diagnosis not present

## 2022-04-04 DIAGNOSIS — E782 Mixed hyperlipidemia: Secondary | ICD-10-CM

## 2022-04-04 NOTE — Telephone Encounter (Signed)
Patient had OV today and stated she'd like to trial Evenity for 12 months and then move forward with Prolia  Knox Saliva, PharmD, MPH, BCPS, CPP Clinical Pharmacist (Rheumatology and Pulmonology)

## 2022-04-04 NOTE — Progress Notes (Signed)
Pharmacy Note  Subjective:  Patient presents today to [facility] for follow up office visit.   Patient was seen by the pharmacist for counseling on Evenity. She was previously counseled on Prolia but is now interested in [place] after discussion with her son who is an Research scientist (medical). She would then like to transition to Prolia after.  History of cardiovascular events in the past 12 months? NO. History of mitral valve replacement.  Objective: CMP     Component Value Date/Time   NA 133 (L) 03/21/2022 1150   NA 137 07/10/2021 1435   K 4.8 03/21/2022 1150   CL 100 03/21/2022 1150   CO2 28 03/21/2022 1150   GLUCOSE 85 03/21/2022 1150   BUN 13 03/21/2022 1150   BUN 22 07/10/2021 1435   CREATININE 0.67 03/21/2022 1150   CALCIUM 9.5 03/21/2022 1150   PROT 6.4 03/21/2022 1150   PROT 6.4 03/21/2022 1150   PROT 6.0 07/10/2021 1435   ALBUMIN 4.1 07/10/2021 1435   AST 25 03/21/2022 1150   ALT 19 03/21/2022 1150   ALKPHOS 96 07/10/2021 1435   BILITOT 0.7 03/21/2022 1150   BILITOT 0.4 07/10/2021 1435   GFRNONAA 85 08/04/2020 1437   GFRAA 99 08/04/2020 1437    Vitamin D Lab Results  Component Value Date   VD25OH 81 01/29/2022   10/10/2009-severe osteoporosis   Assessment/Plan:  Counseled patient on purpose, proper use, and adverse effects of Evenity.  Counseled patient that [place] is a medication that must be injected once monthly for 12 months by a healthcare professional.  Advised patient to take calcium 1200 mg daily and vitamin D 800 units daily.  Reviewed the most common adverse effects of Evenity including headache, osteonecrosis of the jaw, and muscle/bone pain.  Reviewed that [place]o [place] has FDA boxed warning for major adverse cardiovascular events. Reviewed that Evenity may increase the risk of myocardial infarction, stroke, and cardiovascular death. Confirmed that patient has not had MI or stroke within the preceding year  Patient confirms she does not have any major dental  work planned at this time.  Reviewed with patient the signs/symptoms of low calcium and advised patient to alert [place] if she experiences these symptoms.  Provided patient with medication education material and answered all questions. Will apply for Evenity through patient's insurance in January 2024 as patient does not want to start until January 2024  Knox Saliva, PharmD, MPH, BCPS, CPP Clinical Pharmacist (Rheumatology and Pulmonology)

## 2022-04-05 ENCOUNTER — Telehealth: Payer: Self-pay | Admitting: Pharmacist

## 2022-04-05 NOTE — Telephone Encounter (Signed)
Please start EVENITY BIV in January 2024. Patient would like to start in January  Knox Saliva, PharmD, MPH, BCPS, CPP Clinical Pharmacist (Rheumatology and Pulmonology)

## 2022-04-17 ENCOUNTER — Inpatient Hospital Stay: Payer: Medicare Other | Attending: Oncology | Admitting: Oncology

## 2022-04-17 ENCOUNTER — Inpatient Hospital Stay: Payer: Medicare Other

## 2022-04-17 VITALS — BP 121/70 | HR 66 | Temp 98.2°F | Resp 20 | Ht 62.0 in | Wt 98.2 lb

## 2022-04-17 DIAGNOSIS — G8929 Other chronic pain: Secondary | ICD-10-CM | POA: Insufficient documentation

## 2022-04-17 DIAGNOSIS — Z853 Personal history of malignant neoplasm of breast: Secondary | ICD-10-CM | POA: Diagnosis not present

## 2022-04-17 DIAGNOSIS — I4891 Unspecified atrial fibrillation: Secondary | ICD-10-CM | POA: Diagnosis not present

## 2022-04-17 DIAGNOSIS — M8008XA Age-related osteoporosis with current pathological fracture, vertebra(e), initial encounter for fracture: Secondary | ICD-10-CM | POA: Insufficient documentation

## 2022-04-17 DIAGNOSIS — Z9011 Acquired absence of right breast and nipple: Secondary | ICD-10-CM | POA: Insufficient documentation

## 2022-04-17 DIAGNOSIS — Z79899 Other long term (current) drug therapy: Secondary | ICD-10-CM | POA: Insufficient documentation

## 2022-04-17 DIAGNOSIS — D472 Monoclonal gammopathy: Secondary | ICD-10-CM | POA: Diagnosis not present

## 2022-04-17 DIAGNOSIS — M1990 Unspecified osteoarthritis, unspecified site: Secondary | ICD-10-CM | POA: Insufficient documentation

## 2022-04-17 DIAGNOSIS — J449 Chronic obstructive pulmonary disease, unspecified: Secondary | ICD-10-CM | POA: Diagnosis not present

## 2022-04-17 DIAGNOSIS — Z87891 Personal history of nicotine dependence: Secondary | ICD-10-CM | POA: Diagnosis not present

## 2022-04-17 DIAGNOSIS — M549 Dorsalgia, unspecified: Secondary | ICD-10-CM | POA: Insufficient documentation

## 2022-04-17 DIAGNOSIS — Z801 Family history of malignant neoplasm of trachea, bronchus and lung: Secondary | ICD-10-CM | POA: Insufficient documentation

## 2022-04-17 DIAGNOSIS — Z9049 Acquired absence of other specified parts of digestive tract: Secondary | ICD-10-CM | POA: Insufficient documentation

## 2022-04-17 NOTE — Progress Notes (Signed)
[place] New Patient Consult   Requesting MD: Ofilia Neas, [address] [place],  [place] [address]   Helen Davis 81 y.o.  03/10/1941    Reason for Consult: Monoclonal protein   HPI: Helen Davis has a history of osteoporosis and arthritis.  She has a history of lumbar vertebral compression fractures.  She was referred to rheumatology for management of osteoporosis. The had a nontraumatic compression fracture at L3 September.  An MRI 02/07/2022 revealed an acute mild compression fracture at the superior endplate of L3 with a chronic compression deformity of L4.  She decided against a kyphoplasty procedure.  The pain is markedly improved.  Rheumatology obtained lab studies on 03/21/2022.  Results included a creatinine of 0.67, calcium 9.5, total protein 6.4, hemoglobin 12, platelets 316,000, white count 5.0 with an ANC of 3.0.  A serum protein electrophoresis revealed 3 abnormal protein bands in the gamma region measuring 0.4, 0.2, and 0.2 g/dL.  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  She generally feels well.  She walks 4 miles per day.  She has chronic low back pain, much improved compared to September of this year.  Past Medical History:  Diagnosis Date   ABNORMAL EKG    Anemia    Breast cancer (HCC)-right, lumpectomy followed by chemotherapy with recurrent right breast cancer the following year treated with a mastectomy, chemotherapy, and 5 years of tamoxifen 1998 and 1999   Fatigue    Mitral valve disorders(424.0)    Vitamin D deficiency     .  G2, P2   .  Osteoporosis   .  Lumbar vertebral compression fractures   .  COPD   .  Right lung nodule-followed by pulmonary medicine  Past Surgical History:  Procedure Laterality Date   APPENDECTOMY  1966   MASTECTOMY     MITRAL VALVE REPLACEMENT  05/29/2021   RIGHT/LEFT HEART CATH AND CORONARY ANGIOGRAPHY N/A 05/05/2021   Procedure: RIGHT/LEFT HEART  CATH AND CORONARY ANGIOGRAPHY;  Surgeon: Sherren Mocha, MD;  Location: [facility];  Service: Cardiovascular;  Laterality: N/A;   TEE WITHOUT CARDIOVERSION N/A 02/09/2021   Procedure: TRANSESOPHAGEAL ECHOCARDIOGRAM (TEE);  Surgeon: Sanda Klein, MD;  Location: [facility];  Service: Cardiovascular;  Laterality: N/A;   [place]    .  Bilateral cataract surgery  Medications: Reviewed  Allergies:  Allergies  Allergen Reactions   Covid-19 Mrna Vacc (Moderna) Rash    Patient states 1 week after moderna had severe blistering rash from left shoulder down to left elbow, will not get second shot    Family history: Her sister had lung cancer and was a smoker  Social History:   She lives alone in [place].  She is a retired Education officer, museum.  Her sons live in [place].  She quit smoking cigarettes in 1974.  She has 1 glass of wine per day.  No risk factor for HIV or hepatitis.  No transfusion history.  ROS:   Positives include: Chronic low back pain  A complete ROS was otherwise negative.  Physical Exam:  Blood pressure 121/70, pulse 66, temperature 98.2 F (36.8 C), temperature source Oral, resp. rate 20, height _0  (1.575 m), weight 98 lb 3.2 oz (44.5 kg), SpO2 98 %.  HEENT: Oropharynx without visible mass, neck without mass Lungs: Lungs clear bilaterally, no respiratory distress  Cardiac: Regular rate and rhythm Abdomen: No hepatosplenomegaly, nontender, no mass  Vascular: No leg  edema Lymph nodes: No cervical, supraclavicular, right axillary nodes.  "Shotty "left axillary and inguinal nodes Neurologic: Alert and oriented, the motor exam appears intact in the upper and lower extremities bilaterally Skin: No rash, Cherry moles over the trunk Musculoskeletal: No spine tenderness, arthritic changes at the hand joints and lateral wrist/ulnar bone   LAB:  CBC  Lab Results  Component Value Date   WBC 5.0 03/21/2022   HGB 12.0 03/21/2022    HCT 36.7 03/21/2022   MCV 96.6 03/21/2022   PLT 316 03/21/2022   NEUTROABS 3,015 03/21/2022        CMP  Lab Results  Component Value Date   NA 133 (L) 03/21/2022   K 4.8 03/21/2022   CL 100 03/21/2022   CO2 28 03/21/2022   GLUCOSE 85 03/21/2022   BUN 13 03/21/2022   CREATININE 0.67 03/21/2022   CALCIUM 9.5 03/21/2022   PROT 6.4 03/21/2022   PROT 6.4 03/21/2022   ALBUMIN 4.1 07/10/2021   AST 25 03/21/2022   ALT 19 03/21/2022   ALKPHOS 96 07/10/2021   BILITOT 0.7 03/21/2022   GFRNONAA 85 08/04/2020   GFRAA 99 08/04/2020        Assessment/Plan:   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  Serum immune fixation confirmed biclonal IgM monoclonal bands.  She does not have clinical or other laboratory evidence to suggest multiple myeloma or another lymphoproliferative disorder.  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  Imaging studies have not revealed evidence of multiple myeloma or lymphoma.  We obtained quantitative immunoglobulin levels, serum free light chains, and a repeat serum immunofixation today.  She will return for an office and lab visit in 6 months.  Helen Davis will continue follow-up with Dr. Lamonte Sakai for evaluation of the pulmonary nodule.  She sees rheumatology for management of osteoporosis and arthritis. Betsy Coder, MD  04/17/2022, 5:10  PM

## 2022-04-18 LAB — KAPPA/LAMBDA LIGHT CHAINS
Kappa free light chain: 46.8 mg/L — ABNORMAL HIGH (ref 3.3–19.4)
Kappa, lambda light chain ratio: 2.6 — ABNORMAL HIGH (ref 0.26–1.65)
Lambda free light chains: 18 mg/L (ref 5.7–26.3)

## 2022-04-19 LAB — MULTIPLE MYELOMA PANEL, SERUM
Albumin SerPl Elph-Mcnc: 3.9 g/dL (ref 2.9–4.4)
Albumin/Glob SerPl: 1.4 (ref 0.7–1.7)
Alpha 1: 0.2 g/dL (ref 0.0–0.4)
Alpha2 Glob SerPl Elph-Mcnc: 0.6 g/dL (ref 0.4–1.0)
B-Globulin SerPl Elph-Mcnc: 0.7 g/dL (ref 0.7–1.3)
Gamma Glob SerPl Elph-Mcnc: 1.2 g/dL (ref 0.4–1.8)
Globulin, Total: 2.8 g/dL (ref 2.2–3.9)
IgA: 29 mg/dL — ABNORMAL LOW (ref 64–422)
IgG (Immunoglobin G), Serum: 453 mg/dL — ABNORMAL LOW (ref 586–1602)
IgM (Immunoglobulin M), Srm: 1218 mg/dL — ABNORMAL HIGH (ref 26–217)
M Protein SerPl Elph-Mcnc: 0.4 g/dL — ABNORMAL HIGH
Total Protein ELP: 6.7 g/dL (ref 6.0–8.5)

## 2022-04-20 ENCOUNTER — Telehealth: Payer: Self-pay | Admitting: *Deleted

## 2022-04-20 DIAGNOSIS — D472 Monoclonal gammopathy: Secondary | ICD-10-CM

## 2022-04-20 NOTE — Telephone Encounter (Signed)
Per Dr. 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  No treatment needed at present, follow-up with a repeat IgM and serum protein electrophoresis, CBC, and CMP, 3-4 months. Left VM that MD message will be sent to her via MyChart and scheduler will reach out for lab appointment for 3-4 months

## 2022-05-23 ENCOUNTER — Telehealth: Payer: Self-pay | Admitting: Internal Medicine

## 2022-05-23 NOTE — Telephone Encounter (Signed)
Casey Maxfield which is patients son called, He said he talked to Dr Ronnald Ramp when he was in the office in early December. He said Dr Ronnald Ramp said he would take patient on as a new patient and mentioned being scheduled on a Friday.   Is this okay? And if so which Friday and what times?  Callback is [phone]

## 2022-05-25 ENCOUNTER — Ambulatory Visit (INDEPENDENT_AMBULATORY_CARE_PROVIDER_SITE_OTHER): Payer: Medicare Other | Admitting: Internal Medicine

## 2022-05-25 ENCOUNTER — Encounter: Payer: Self-pay | Admitting: Internal Medicine

## 2022-05-25 VITALS — BP 136/82 | HR 69 | Temp 98.1°F | Resp 16 | Ht 62.0 in | Wt 98.0 lb

## 2022-05-25 DIAGNOSIS — M8000XD Age-related osteoporosis with current pathological fracture, unspecified site, subsequent encounter for fracture with routine healing: Secondary | ICD-10-CM | POA: Diagnosis not present

## 2022-05-25 NOTE — Progress Notes (Unsigned)
Subjective:  Patient ID: Helen Davis, female    DOB: [birth year]  Age: 82 y.o. MRN: [id number]  CC: No chief complaint on file.   HPI Helen Davis presents for ***  History Helen Davis has a past medical history of ABNORMAL EKG, Anemia, Breast cancer ([place]), Fatigue, Mitral valve disorders(424.0), and Vitamin D deficiency.   She has a past surgical history that includes Mastectomy; Appendectomy (1966); Varicose vein surgery (Right, 1975); TEE without cardioversion (N/A, 02/09/2021); RIGHT/LEFT HEART CATH AND CORONARY ANGIOGRAPHY (N/A, 05/05/2021); and Mitral valve replacement (05/29/2021).   Her family history includes Atrial fibrillation in her mother; Cancer in an other family member; Dementia in her father; Emphysema in her father; Healthy in her son and son; Heart disease in her sister; Hypertension in an other family member; Stroke in her mother and another family member.She reports that she quit smoking about 50 years ago. Her smoking use included cigarettes. She has been exposed to tobacco smoke. She has never used smokeless tobacco. She reports current alcohol use of about 3.0 standard drinks of alcohol per week. She reports that she does not use drugs.  Outpatient Medications Prior to Visit  Medication Sig Dispense Refill   Acetaminophen (TYLENOL EXTRA STRENGTH PO) Take by mouth.     amoxicillin (AMOXIL) 500 MG tablet Take 4 tablets 1 hour prior to dental work. 4 tablet 0   apixaban (ELIQUIS) 5 MG TABS tablet Take 1 tablet (5 mg total) by mouth 2 (two) times daily. 180 tablet 1   aspirin 81 MG chewable tablet Chew 1 tablet by mouth daily at 12 noon.     Bioflavonoid Products (ESTER-C) 500-550 MG TABS Take 500-1,000 mg by mouth daily.     Cholecalciferol (VITAMIN D PO) Take 4,000 Int'l Units by mouth daily.     Cyanocobalamin (VITAMIN B 12 PO) Take 1,000 mcg by mouth daily. Timed release     MAGNESIUM PO Take 500 mg by mouth at bedtime.     metoprolol tartrate (LOPRESSOR) 25  MG tablet Take 0.5 tablets (12.5 mg total) by mouth 2 (two) times daily. 90 tablet 3   Multiple Vitamins-Minerals (CENTRUM SILVER PO) Take 1 tablet by mouth daily. With iron     Zinc 30 MG TABS Take 30 mg by mouth daily. PRN     No facility-administered medications prior to visit.    ROS Review of Systems  Objective:  BP 136/82 (BP Location: Left Arm, Patient Position: Sitting, Cuff Size: Normal)   Pulse 69   Temp 98.1 F (36.7 C) (Oral)   Ht '5\' 2"'$  (1.575 m)   Wt 98 lb (44.5 kg)   LMP  (LMP Unknown)   SpO2 99%   BMI 17.92 kg/m   Physical Exam  Lab Results  Component Value Date   WBC 5.0 03/21/2022   HGB 12.0 03/21/2022   HCT 36.7 03/21/2022   PLT 316 03/21/2022   GLUCOSE 85 03/21/2022   CHOL 194 01/29/2022   TRIG 97 01/29/2022   HDL 92 01/29/2022   LDLCALC 83 01/29/2022   ALT 19 03/21/2022   AST 25 03/21/2022   NA 133 (L) 03/21/2022   K 4.8 03/21/2022   CL 100 03/21/2022   CREATININE 0.67 03/21/2022   BUN 13 03/21/2022   CO2 28 03/21/2022   TSH 0.67 01/29/2022   HGBA1C 5.1 01/29/2022   MICROALBUR <0.2 01/29/2022     Assessment & Plan:   Diagnoses and all orders for this visit:  Age-related osteoporosis with current pathological fracture  with routine healing, subsequent encounter   I am having Salomon Mast. [place] maintain her MAGNESIUM PO, Cholecalciferol (VITAMIN D PO), Zinc, Cyanocobalamin (VITAMIN B 12 PO), Multiple Vitamins-Minerals (CENTRUM SILVER PO), Ester-C, aspirin, Acetaminophen (TYLENOL EXTRA STRENGTH PO), metoprolol tartrate, amoxicillin, and apixaban.  No orders of the defined types were placed in this encounter.    Follow-up: Return in about 6 months (around 11/23/2022).  Scarlette Calico, MD

## 2022-05-25 NOTE — Patient Instructions (Signed)

## 2022-05-27 ENCOUNTER — Encounter: Payer: Self-pay | Admitting: Internal Medicine

## 2022-05-30 ENCOUNTER — Ambulatory Visit: Payer: Medicare Other | Admitting: Physician Assistant

## 2022-06-01 ENCOUNTER — Telehealth: Payer: Self-pay

## 2022-06-01 NOTE — Telephone Encounter (Signed)
Janith Lima, MD  Jasper Loser, CMA Will you see if she qualifies for evenity?

## 2022-06-01 NOTE — Telephone Encounter (Signed)
Prolia VOB initiated via [email or web address] ? ?New start ? ?

## 2022-06-04 ENCOUNTER — Ambulatory Visit (HOSPITAL_COMMUNITY)
Admission: RE | Admit: 2022-06-04 | Discharge: 2022-06-04 | Disposition: A | Discharge status: Home / Self Care | Payer: Medicare Other | Source: Ambulatory Visit | Attending: Emergency Medicine | Admitting: Emergency Medicine

## 2022-06-04 ENCOUNTER — Telehealth: Payer: Self-pay | Admitting: Pharmacy Technician

## 2022-06-04 ENCOUNTER — Encounter (HOSPITAL_COMMUNITY): Payer: Self-pay

## 2022-06-04 ENCOUNTER — Encounter: Payer: Self-pay | Admitting: Rheumatology

## 2022-06-04 DIAGNOSIS — R911 Solitary pulmonary nodule: Secondary | ICD-10-CM | POA: Diagnosis not present

## 2022-06-04 DIAGNOSIS — J479 Bronchiectasis, uncomplicated: Secondary | ICD-10-CM | POA: Diagnosis not present

## 2022-06-04 DIAGNOSIS — R918 Other nonspecific abnormal finding of lung field: Secondary | ICD-10-CM | POA: Diagnosis not present

## 2022-06-04 NOTE — Telephone Encounter (Signed)
Therapy plan placed for [place] for [place].  Dose: '210mg'$  SQ every month for 12 months  Knox Saliva, PharmD, MPH, BCPS, CPP Clinical Pharmacist (Rheumatology and Pulmonology)

## 2022-06-04 NOTE — Telephone Encounter (Addendum)
Please advise. Evenity has been denied.  Auth Submission: DENIED Payer: BCBS Medication & CPT/J Code(s) submitted: Evenity (Romosozumab) [id number] Route of submission (phone, fax, portal): COVER MY MEDS Phone # Fax # Auth type: Buy/Bill Units/visits requested: 3 Reference number: [id number]  Denial letter has been scanned to media tab for review.

## 2022-06-05 ENCOUNTER — Ambulatory Visit (HOSPITAL_BASED_OUTPATIENT_CLINIC_OR_DEPARTMENT_OTHER): Payer: Medicare Other | Admitting: Cardiology

## 2022-06-05 ENCOUNTER — Encounter (HOSPITAL_BASED_OUTPATIENT_CLINIC_OR_DEPARTMENT_OTHER): Payer: Self-pay | Admitting: Cardiology

## 2022-06-05 VITALS — BP 118/69 | HR 71 | Ht 62.0 in | Wt 99.8 lb

## 2022-06-05 DIAGNOSIS — D6859 Other primary thrombophilia: Secondary | ICD-10-CM | POA: Diagnosis not present

## 2022-06-05 DIAGNOSIS — Z7901 Long term (current) use of anticoagulants: Secondary | ICD-10-CM

## 2022-06-05 DIAGNOSIS — Z7189 Other specified counseling: Secondary | ICD-10-CM

## 2022-06-05 DIAGNOSIS — Z952 Presence of prosthetic heart valve: Secondary | ICD-10-CM

## 2022-06-05 DIAGNOSIS — I480 Paroxysmal atrial fibrillation: Secondary | ICD-10-CM

## 2022-06-05 DIAGNOSIS — I340 Nonrheumatic mitral (valve) insufficiency: Secondary | ICD-10-CM

## 2022-06-05 NOTE — Telephone Encounter (Signed)
Patient sent MyChart message and stated that Evenity will plan to be managed by her new PCP Dr. Ronnald Ramp.  I've cancelled referral to [facility]  Knox Saliva, PharmD, MPH, BCPS, CPP Clinical Pharmacist (Rheumatology and Pulmonology)

## 2022-06-05 NOTE — Patient Instructions (Signed)
Medication Instructions:  Your physician recommends that you continue on your current medications as directed. Please refer to the Current Medication list given to you today.  *If you need a refill on your cardiac medications before your next appointment, please call your pharmacy*  Lab Work: NONE  Testing/Procedures: NONE  Follow-Up: At [facility], you and your health needs are our priority.  As part of our continuing mission to provide you with exceptional heart care, we have created designated Provider Care Teams.  These Care Teams include your primary Cardiologist (physician) and Advanced Practice Providers (APPs -  Physician Assistants and Nurse Practitioners) who all work together to provide you with the care you need, when you need it.  We recommend signing up for the patient portal called "MyChart".  Sign up information is provided on this After Visit Summary.  MyChart is used to connect with patients for Virtual Visits (Telemedicine).  Patients are able to view lab/test results, encounter notes, upcoming appointments, etc.  Non-urgent messages can be sent to your provider as well.   To learn more about what you can do with MyChart, go to [email or web address].    Your next appointment:   6 month(s)  The format for your next appointment:   In Person  Provider:   [name], MD    ill on your cardiac medications before your next appointment, please call

## 2022-06-05 NOTE — Progress Notes (Signed)
Cardiology Office Note:    Date:  06/05/2022   ID:  Helen Davis, DOB 1940-08-31, MRN [id number]  PCP:  Janith Lima, MD  Cardiologist:  [name], MD  Referring MD: Unk Pinto, MD   CC: follow up  History of Present Illness:    Helen Davis is a 82 y.o. female with a hx of mitral regurgitation s/p bioprosthetic MVR at [facility] 05/2021, remote breast cancer who is seen for follow up today. I initially met her 02/15/20 as a new consult at the request of Unk Pinto, MD for the evaluation and management of mitral regurgitation.  Pertinent history: -mitral valve replacement (42m Carpentier-Edwards Mitris bioprosthetic valve) [facility] Had heartport incision, healing well. Found to be in new afib at her follow up visit. [facility] "A 2.4 x 1.6 cm subsolid spiculated nodule in the anterior right upper lobe. No tracheobronchial tree abnormalities. No pleural effusion."  [place] and 4 mos of chemo initially but then had bilateral mastectomy when another mass found, underwent additional chemo. Remotely seen by Dr. MJana Hakim has been ~20 years since follow up.  At her last visit she was doing well. We discussed postop afib at length. No symptoms since stopping amiodarone. 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 Both her mother and sister have had strokes. She does bruise easily but felt this was manageable. After shared decision making, we continued anticoagulation.  Today, she states he is feeling pretty well overall.   She continues to stay active. Yesterday she walked almost 5 miles in the park. Typically she tries to walk 4 miles a day. She walks at a decent pace; sometimes she needs to rest when going up the final hill. Occasionally while walking she develops left shoulder discomfort. She is able to "shake out" the  discomfort.  She endorses bruising easily. Minor abrasions and lacerations do heal appropriately with minimal bleeding. Compliant with Eliquis. Typically her pulse is always in the 70s.  She denies any palpitations, chest pain, or peripheral edema. No lightheadedness, headaches, syncope, orthopnea, or PND.  Of note, she reports having a bone density test 03/22/22 which showed severe osteoporosis. It was recommended that she be on Forteo. She is following with her PCP regarding this.   Past Medical History:  Diagnosis Date   ABNORMAL EKG    Anemia    [place]    Fatigue    Mitral valve disorders(424.0)    Vitamin D deficiency     Past Surgical History:  Procedure Laterality Date   APPENDECTOMY  1966   MASTECTOMY     MITRAL VALVE REPLACEMENT  05/29/2021   RIGHT/LEFT HEART CATH AND CORONARY ANGIOGRAPHY N/A 05/05/2021   Procedure: RIGHT/LEFT HEART CATH AND CORONARY ANGIOGRAPHY;  Surgeon: CSherren Mocha MD;  [facility];  Service: Cardiovascular;  Laterality: N/A;   TEE WITHOUT CARDIOVERSION N/A 02/09/2021   Procedure: TRANSESOPHAGEAL ECHOCARDIOGRAM (TEE);  Surgeon: CSanda Klein MD;  [facility];  Service: Cardiovascular;  Laterality: N/A;   VARICOSE VEIN SURGERY Right 1975    Current Medications: Current Outpatient Medications on File Prior to Visit  Medication Sig   Acetaminophen (TYLENOL EXTRA STRENGTH PO) Take by mouth.   amoxicillin (AMOXIL) 500 MG tablet Take 4 tablets 1 hour prior to dental work.   apixaban (ELIQUIS) 5 MG TABS tablet Take 1 tablet (5  mg total) by mouth 2 (two) times daily.   Bioflavonoid Products (ESTER-C) 500-550 MG TABS Take 500-1,000 mg by mouth daily.   Cholecalciferol (VITAMIN D PO) Take 4,000 Int'l Units by mouth daily.   Cyanocobalamin (VITAMIN B 12 PO) Take 1,000 mcg by mouth daily. Timed release   MAGNESIUM PO Take 500 mg by mouth at bedtime.   metoprolol tartrate (LOPRESSOR) 25 MG tablet Take 0.5 tablets  (12.5 mg total) by mouth 2 (two) times daily.   Multiple Vitamins-Minerals (CENTRUM SILVER PO) Take 1 tablet by mouth daily. With iron   Zinc 30 MG TABS Take 30 mg by mouth daily. PRN   No current facility-administered medications on file prior to visit.     Allergies:   Covid-19 mrna vacc (moderna)   Social History   Tobacco Use   Smoking status: Former    Types: Cigarettes    Quit date: 05/21/1972    Years since quitting: 50.0    Passive exposure: Current   Smokeless tobacco: Never  Vaping Use   Vaping Use: Never used  Substance Use Topics   Alcohol use: Yes    Alcohol/week: 3.0 standard drinks of alcohol    Types: 3 Glasses of wine per week    Comment: occ   Drug use: No    Family History: family history includes Atrial fibrillation in her mother; Cancer in an other family member; Dementia in her father; Emphysema in her father; Healthy in her son and son; Heart disease in her sister; Hypertension in an other family member; Stroke in her mother and another family member.  ROS:   Please see the history of present illness.   (+) Easy bruising (+) Left shoulder discomfort Additional pertinent ROS otherwise unremarkable.  EKGs/Labs/Other Studies Reviewed:    The following studies were reviewed today:  CT Chest  06/04/2022: IMPRESSION: Overall no significant interval change.   Spiculated ground-glass nodular lesion once again seen in the anterior central right upper lobe. Stable in size and appearance. Also stable smaller middle lobe nodule in area of presumed bronchiectasis.   Adenocarcinoma cannot be excluded. Follow up by CT is recommended in 6-12 months, with continued annual surveillance for a minimum of 3 years.   These recommendations are taken from:   Recommendations for the Management of Subsolid Pulmonary Nodules Detected at CT: A Statement from the [place]   [facility]iology 2013; 266:1, [phone].   Persistent paraspinal multilevel low-density  areas lung neural foramen. Although this has a differential these could represent perineural cysts. Please correlate with any prior workup or dedicated MRI when appropriate   Aortic Atherosclerosis (ICD10-I70.0).  Echo  06/02/2021  ([facility]): Interpretation: MODERATE LV DYSFUNCTION (See above) WITH MILD LVH  NORMAL RIGHT VENTRICULAR SYSTOLIC FUNCTION  VALVULAR REGURGITATION: MILD AR, TRIVIAL MR, TRIVIAL PR, MILD TR  PROSTHETIC VALVE(S): BIOPROSTHETIC MV  NO PRIOR STUDY FOR COMPARISON   Cath 05/05/21 1.  Widely patent coronary arteries with minimal luminal irregularities and no significant stenoses, right dominant 2.  Calcified mitral annulus seen on plain fluoroscopy 3.  Normal right heart hemodynamics with mean pulmonary artery pressure 21, pulmonary capillary wedge V wave of 14, mean wedge pressure of 9, preserved cardiac output of 6 L/min by Fick calculation   Recommend: Patient in work-up for surgical mitral valve repair or replacement  Echo TEE 02/09/2021:  1. Left ventricular ejection fraction, by estimation, is 60 to 65%. The  left ventricle has normal function. The left ventricle has no regional  wall motion abnormalities. The  left ventricular internal cavity size was  mildly dilated.   2. Right ventricular systolic function is normal. The right ventricular  size is normal. There is normal pulmonary artery systolic pressure.   3. Left atrial size was mild to moderately dilated. No left atrial/left  atrial appendage thrombus was detected.   4. The lateral half of the mitral posterior leaflet middle scallop (P2  towards the P1 subcommissure) has severe prolapse. The effective  regurgitant orifice area is 0.83 cm2, the regurgitant volume 166 ml. the  regurgitant fraction is 80%. The posterior  leaflet mobile segment length is 11 mm, after allowing for encroachment of mitral annular calcification into the base of the leaflet. The maximum  coaptation gap is 5 mm, the coaptation  defect width is approximately 10  mm. The mitral valve is myxomatous.  Severe mitral valve regurgitation. No evidence of mitral stenosis. There  is severe holosystolic prolapse of the middle scallop of the posterior  leaflet of the mitral valve. The mean mitral valve gradient is 2.0 mmHg  with average heart rate of 66 bpm.   5. Tricuspid valve regurgitation is mild to moderate.   6. The aortic valve is tricuspid. Aortic valve regurgitation is mild. No  aortic stenosis is present.   Echo 01/19/2021: 1. Left ventricular ejection fraction, by estimation, is 65 to 70%. Left  ventricular ejection fraction by 3D volume is 67 %. The left ventricle has  normal function. The left ventricle has no regional wall motion  abnormalities. The left ventricular internal cavity size was moderately dilated. Left ventricular diastolic parameters are consistent with Grade III diastolic dysfunction (restrictive). Elevated left ventricular end-diastolic pressure.   2. Right ventricular systolic function is normal. The right ventricular  size is mildly enlarged. There is normal pulmonary artery systolic  pressure.   3. Left atrial size was severely dilated.   4. Right atrial size was moderately dilated.   5. 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 No evidence of  mitral stenosis. There is moderate late systolic prolapse of the of the  posterior leaflet of the mitral valve. Moderate mitral annular  calcification. Severe mitral valve regurgitation with eccentric anteriorly directed jet that wraps around the entire left atrium.   6. The aortic valve is tricuspid. Aortic valve regurgitation is mild.  Mild aortic valve sclerosis is present, with no evidence of aortic valve stenosis. Aortic regurgitation PHT measures 703 msec. Aortic valve area, by VTI measures 1.79 cm. Aortic valve mean gradient measures 3.0 mmHg. Aortic valve Vmax measures 1.25 m/s.   7. There is mild dilatation of the aortic root, measuring  38 mm. There is mild dilatation of the ascending aorta, measuring 38 mm.   8. The inferior vena cava is normal in size with greater than 50%  respiratory variability, suggesting right atrial pressure of 3 mmHg.   9. Additional Comments: There is a cystic structure in the liver  measuring 2.14cm x 2.72cm.  10. Recommend TEE for further evaluation of mitral regurgitation.  11. Compared to prior study, LV dimensions have increased with preserved LVF. Mitral regurgitation appears severe.   Echo 01/06/20  1. Left ventricular ejection fraction, by estimation, is 60 to 65%. The  left ventricle has normal function. The left ventricle has no regional  wall motion abnormalities. Left ventricular diastolic parameters are  consistent with Grade II diastolic dysfunction (pseudonormalization).   2. Right ventricular systolic function is normal. The right ventricular  size is normal. There is normal pulmonary artery systolic pressure.  3. Left atrial size was severely dilated.   4. Likely severe mitral regurgitation with multiple jets. Coanda effect  noted on the interatrial septum. No pumonary vein reverse flow noted, though only one PV is sampled. PISA was not obtained. Consider TEE for further evaluation if clinically  indicated. The mitral valve is normal in structure. Severe mitral valve  regurgitation. No evidence of mitral stenosis.   5. The aortic valve is tricuspid. Aortic valve regurgitation is mild. No  aortic stenosis is present.   6. The inferior vena cava is normal in size with greater than 50%  respiratory variability, suggesting right atrial pressure of 3 mmHg.   EKG:  EKG is personally reviewed.   06/05/2022:  not ordered today 09/19/21: NSR at 74 bpm, LVH with repol 06/22/21: atrial fibrillation with RVR at 113 bpm, LVH with repol 04/10/2021: not ordered  01/30/2021: NSR at 70 bpm, LVH 02/15/20: sinus rhythm with PACs, LVH  Recent Labs: 01/29/2022: Magnesium 2.2; TSH 0.67 03/21/2022: ALT  19; BUN 13; Creat 0.67; Hemoglobin 12.0; Platelets 316; Potassium 4.8; Sodium 133   Recent Lipid Panel    Component Value Date/Time   CHOL 194 01/29/2022 1428   TRIG 97 01/29/2022 1428   HDL 92 01/29/2022 1428   CHOLHDL 2.1 01/29/2022 1428   VLDL 15 12/17/2016 1432   LDLCALC 83 01/29/2022 1428    Physical Exam:    VS:  BP 118/69 (BP Location: Left Arm, Patient Position: Sitting, Cuff Size: Normal)   Pulse 71   Ht 5' 2"$  (1.575 m)   Wt 99 lb 12.8 oz (45.3 kg)   LMP  (LMP Unknown)   SpO2 99%   BMI 18.25 kg/m     Wt Readings from Last 3 Encounters:  06/05/22 99 lb 12.8 oz (45.3 kg)  05/25/22 98 lb (44.5 kg)  04/17/22 98 lb 3.2 oz (44.5 kg)   GEN: Well nourished, well developed in no acute distress HEENT: Normal, moist mucous membranes.  NECK: No JVD CARDIAC: regular rhythm, normal S1 and S2, no rubs or gallops. No murmur. VASCULAR: Radial and DP pulses 2+ bilaterally. No carotid bruits RESPIRATORY:  Clear to auscultation without rales, wheezing or rhonchi  ABDOMEN: Soft, non-tender, non-distended MUSCULOSKELETAL:  Ambulates independently SKIN: Warm and dry, no edema NEUROLOGIC:  Alert and oriented x 3. No focal neuro deficits noted. PSYCHIATRIC:  Normal affect    ASSESSMENT:    1. Severe mitral regurgitation   2. S/P MVR (mitral valve replacement)   3. Paroxysmal atrial fibrillation (HCC)   4. Hypercoagulable state ([place])   5. Long term current use of anticoagulant   6. Cardiac risk counseling     PLAN:    Severe mitral regurgitation S/P Bioprosthetic MVR (23m Carpentier-Edwards Mitris) [facility] Postoperative atrial fibrillation with RVR -CHA2DS2/VAS Stroke Risk Points= 4  -continue apixaban, see shared decision making. -has not had any palpitations off of amiodarone. 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 Both her mother and sister have had strokes. She does bruise easily but feels this is  manageable. After shared decision making, will continue anticoagulation. -aspirin 81 mg chronically due to valve -prophylactic antibiotics before dental cleanings. -continue low dose metoprolol  Pulmonary nodule found on recent CT scan History of breast cancer s/p bilateral mastectomy and chemo -concern with spiculated nodule seen on CT. Recently saw Dr. BLamonte Sakaiwith repeat scan, stable.   Cardiac risk counseling and prevention recommendations: -recommend heart healthy/Mediterranean diet,  with whole grains, fruits, vegetable, fish, lean meats, nuts, and olive oil. Limit salt. -recommend moderate walking, 3-5 times/week for 30-50 minutes each session. Aim for at least 150 minutes.week. Goal should be pace of 3 miles/hours, or walking 1.5 miles in 30 minutes -recommend avoidance of tobacco products. Avoid excess alcohol.   Plan for follow up: 6 mos or sooner as needed  [name], MD, PhD, [facility]    Medication Adjustments/Labs and Tests Ordered: Current medicines are reviewed at length with the patient today.  Concerns regarding medicines are outlined above.   No orders of the defined types were placed in this encounter.  No orders of the defined types were placed in this encounter.  Patient Instructions  Medication Instructions:  Your physician recommends that you continue on your current medications as directed. Please refer to the Current Medication list given to you today.  *If you need a refill on your cardiac medications before your next appointment, please call your pharmacy*  Lab Work: NONE  Testing/Procedures: NONE  Follow-Up: At [facility], you and your health needs are our priority.  As part of our continuing mission to provide you with exceptional heart care, we have created designated Provider Care Teams.  These Care Teams include your primary Cardiologist (physician) and Advanced Practice Providers (APPs -  Physician  Assistants and Nurse Practitioners) who all work together to provide you with the care you need, when you need it.  We recommend signing up for the patient portal called "MyChart".  Sign up information is provided on this After Visit Summary.  MyChart is used to connect with patients for Virtual Visits (Telemedicine).  Patients are able to view lab/test results, encounter notes, upcoming appointments, etc.  Non-urgent messages can be sent to your provider as well.   To learn more about what you can do with MyChart, go to [email or web address].    Your next appointment:   6 month(s)  The format for your next appointment:   In Person  Provider:   [name], MD    ill on your cardiac medications before your next appointment, please call    I,Mathew Stumpf,acting as a scribe for [name], MD.,have documented all relevant documentation on the behalf of [name], MD,as directed by  [name], MD while in the presence of [name], MD.  I, [name], MD, have reviewed all documentation for this visit. The documentation on 07/08/22 for the exam, diagnosis, procedures, and orders are all accurate and complete.   Signed, [name], MD PhD 06/05/2022  [place]

## 2022-06-14 ENCOUNTER — Encounter: Payer: Self-pay | Admitting: Emergency Medicine

## 2022-06-14 ENCOUNTER — Ambulatory Visit: Payer: Medicare Other | Admitting: Emergency Medicine

## 2022-06-14 VITALS — BP 116/68 | HR 75 | Temp 98.2°F | Ht 63.25 in | Wt 99.4 lb

## 2022-06-14 DIAGNOSIS — R911 Solitary pulmonary nodule: Secondary | ICD-10-CM | POA: Diagnosis not present

## 2022-06-14 NOTE — Progress Notes (Signed)
Subjective:    Patient ID: Helen Davis, female    DOB: May 08, 1941, 82 y.o.   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  She has CAD in the bioprosthetic mitral valve replacement 05/2021.  Atrial fibrillation.  Part of her evaluation at the time of her valvular surgery included a CT chest that showed an incidental 2.4 x 1.6 subsolid anterior right upper lobe pulmonary nodule at least moderate suspicion for adenocarcinoma.  CT chest 11/29/2021 reviewed by me shows no mediastinal or hilar adenopathy, minimal biapical scar.  The spiculated anterior segment right upper lobe groundglass nodule is 1.9 x 2.4 cm, stable in size compared with 05/28/2021.  There is some mild architectural distortion and volume loss in the adjacent right upper lobe with some associated bronchiectasis and mucoid impaction   ROV 06/14/22 --82 year old woman with a history of breast cancer (remote 1999), suspected rheumatoid arthritis, CAD with a bioprosthetic mitral valve replacement, atrial fibrillation.  Minimal tobacco history.  I have seen her to evaluate an anterior segment right upper lobe groundglass pulmonary nodule.  We have been following with serial imaging.  Was stable on her scan in July 2023.  Most recent CT 06/04/2022 as below  CT scan of the chest 06/04/2022 reviewed by me, shows that her right upper lobe ill-defined groundglass opacity is overall unchanged, measured this time at 2.5 x 1.6 cm.  Also noted is some mild right middle lobe bronchiectasis that is unchanged.    Review of Systems As per HPI   Past Medical History:  Diagnosis Date   ABNORMAL EKG    Anemia    Breast cancer ([place])    Fatigue    Mitral valve disorders(424.0)    Vitamin D deficiency      Family History  Problem Relation Age of Onset   Atrial fibrillation Mother    Stroke Mother    Emphysema Father    Dementia Father     Heart disease Sister        CHF   Hypertension Other    Stroke Other    Cancer Other    Healthy Son    Healthy Son     Sister > lung cancer   Social History   Socioeconomic History   Marital status: Single    Spouse name: Not on file   Number of children: Not on file   Years of education: Not on file   Highest education level: Not on file  Occupational History   Not on file  Tobacco Use   Smoking status: Former    Types: Cigarettes    Quit date: 05/21/1972    Years since quitting: 50.0    Passive exposure: Current   Smokeless tobacco: Never  Vaping Use   Vaping Use: Never used  Substance and Sexual Activity   Alcohol use: Yes    Alcohol/week: 3.0 standard drinks of alcohol    Types: 3 Glasses of wine per week    Comment: occ   Drug use: No   Sexual activity: Not on file  Other Topics Concern   Not on file  Social History Narrative   Not on file   Social Determinants of Health   Financial Resource Strain: Not on file  Food Insecurity: Not on file  Transportation Needs: Not on file  Physical Activity: Not on file  Stress: Not on file  Social Connections: Not on file  Intimate Partner Violence: Not on file    From [place], has lived in the [place], in [place] No hx TB or pos 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  2nd hand smoke exposure.   Allergies  Allergen Reactions   Covid-19 Mrna Vacc (Moderna) Rash    Patient states 1 week after moderna had severe blistering rash from left shoulder down to left elbow, will not get second shot     Outpatient Medications Prior to Visit  Medication Sig Dispense Refill   Acetaminophen (TYLENOL EXTRA STRENGTH PO) Take by mouth.     amoxicillin (AMOXIL) 500 MG tablet Take 4 tablets 1 hour prior to dental work. 4 tablet 0   apixaban (ELIQUIS) 5 MG TABS tablet Take 1 tablet (5 mg total) by mouth 2 (two) times daily. 180 tablet 1   Bioflavonoid Products (ESTER-C) 500-550 MG TABS Take 500-1,000 mg by mouth daily.      Cholecalciferol (VITAMIN D PO) Take 4,000 Int'l Units by mouth daily.     Cyanocobalamin (VITAMIN B 12 PO) Take 1,000 mcg by mouth daily. Timed release     MAGNESIUM PO Take 500 mg by mouth at bedtime.     metoprolol tartrate (LOPRESSOR) 25 MG tablet Take 0.5 tablets (12.5 mg total) by mouth 2 (two) times daily. 90 tablet 3   Multiple Vitamins-Minerals (CENTRUM SILVER PO) Take 1 tablet by mouth daily. With iron     Zinc 30 MG TABS Take 30 mg by mouth daily. PRN     No facility-administered medications prior to visit.        Objective:   Physical Exam Vitals:   06/14/22 1401  BP: 116/68  Pulse: 75  Temp: 98.2 F (36.8 C)  TempSrc: Oral  SpO2: 99%  Weight: 99 lb 6.6 oz (45.1 kg)  Height: 5' 3.25" (1.607 m)   Gen: Pleasant, well-nourished, in no distress,  normal affect  ENT: No lesions,  mouth clear,  oropharynx clear, no postnasal drip  Neck: No JVD, no stridor  Lungs: No use of accessory muscles, no crackles or wheezing on normal respiration, no wheeze on forced expiration  Cardiovascular: RRR, S1 and S2 click.  No murmur  Musculoskeletal: No deformities, no cyanosis or clubbing  Neuro: alert, awake, non focal  Skin: Warm, no lesions or rash      Assessment & Plan:  Pulmonary nodule CT scan of the chest shows overall stability of her right upper lobe mixed density pulmonary nodule.  We discussed the differential diagnosis including possibly a slow-growing adenocarcinoma, MAIC, other autoimmune process.  No compelling reason to perform bronchoscopy at this time.  We will continue her surveillance.  She will have a repeat CT chest in 1 year.  Follow-up then or sooner if she develops any respiratory symptoms.   Baltazar Apo, MD, PhD 06/14/2022, 2:21 PM [facility] [phone] or if no answer before 7:00PM call [phone] For any issues after 7:00PM please call eLink [phone]

## 2022-06-14 NOTE — Assessment & Plan Note (Signed)
CT scan of the chest shows overall stability of her right upper lobe mixed density pulmonary nodule.  We discussed the differential diagnosis including possibly a slow-growing adenocarcinoma, MAIC, other autoimmune process.  No compelling reason to perform bronchoscopy at this time.  We will continue her surveillance.  She will have a repeat CT chest in 1 year.  Follow-up then or sooner if she develops any respiratory symptoms.

## 2022-06-14 NOTE — Patient Instructions (Addendum)
We reviewed your CT scan of the chest today. We will plan to repeat your CT without contrast in January 2025 to compare with priors. Please follow Dr. Lamonte Sakai in January 2025 after your CT so we can review the results together.

## 2022-06-14 NOTE — Addendum Note (Signed)
Addended by: Dierdre Highman on: 06/14/2022 02:35 PM   Modules accepted: Orders

## 2022-06-18 NOTE — Telephone Encounter (Signed)
Pt ready for scheduling on or after 06/18/22  Out-of-pocket cost due at time of visit: $475  Primary: BCBS [place] Medicare Adv HMO-POS Evenity co-insurance: 20% (approximately $450) Admin fee co-insurance: 20% (approximately $25)  Secondary: n/a Evenity co-insurance:  Admin fee co-insurance:   Deductible: does not apply  Prior Auth: NOT required PA# Valid:   ** This summary of benefits is an estimation of the patient's out-of-pocket cost. Exact cost may vary based on individual plan coverage.

## 2022-06-19 NOTE — Telephone Encounter (Signed)
I have returned pt's call.  She is hesitant to proceed with Evenity injections because of her age and the state of her bones currently. She is inquiring if Evenity would have any benefit due to her age and how much bone do you think she would build?  Pt also expressed that she had undergone heart surgery and it taking cardiac medications. She is concerned about Evenity having a possible side effect of stroke.

## 2022-06-20 ENCOUNTER — Telehealth (HOSPITAL_BASED_OUTPATIENT_CLINIC_OR_DEPARTMENT_OTHER): Payer: Self-pay | Admitting: Cardiology

## 2022-06-20 NOTE — Telephone Encounter (Signed)
Patient was a walk in today and wants to know if she can take Evenity along with her heart medications

## 2022-06-20 NOTE — Telephone Encounter (Signed)
Will forward to Pharm D for review  

## 2022-06-21 NOTE — Telephone Encounter (Signed)
Advised patient, verbalized understanding  

## 2022-06-21 NOTE — Telephone Encounter (Signed)
Patient is returning call.  °

## 2022-06-21 NOTE — Telephone Encounter (Signed)
Med has a boxed warning: may increase the risk of MI, stroke, and cardiovascular death and should not be initiated in patients who have had an MI or stroke within the previous year. Consider benefits/risks of therapy in patients with other cardiovascular risk factors. Discontinue use if MI or stroke occurs during therapy.  Pt does not have history of CAD so ok to use if benefits outweigh risks. No drug interactions with her cardiac meds.

## 2022-06-21 NOTE — Telephone Encounter (Signed)
Left message for patient to return call for answer to her question regarding Evenity.  Georgana Curio MHA RN CCM

## 2022-06-22 ENCOUNTER — Other Ambulatory Visit: Payer: Self-pay | Admitting: Pharmacy Technician

## 2022-06-22 NOTE — Telephone Encounter (Signed)
Please review

## 2022-06-22 NOTE — Telephone Encounter (Signed)
Please clarify

## 2022-06-22 NOTE — Telephone Encounter (Signed)
Spoke with pt. She stated she has discussed Evenity with her cardiologist. Her cardiologist Ok'd her starting medication and she has no other concerns. She stated she has a NV scheduled next week on Thursday 2/8 to receive her first Evenity injection.

## 2022-06-22 NOTE — Telephone Encounter (Signed)
LVM to discuss if pt would like to try Prolia.

## 2022-06-22 NOTE — Telephone Encounter (Signed)
Patient sent MyChart message and stated that Evenity will plan to be managed by her new PCP Dr. Ronnald Ramp.   I've cancelled referral to [facility]   Knox Saliva, PharmD, MPH, BCPS, CPP Clinical Pharmacist (Rheumatology and Pulmonology)

## 2022-06-27 NOTE — Telephone Encounter (Signed)
Pt called about her Evinity shot, she spoke with her insurance and they told her that she has not been approved for the shot.   Please call pt to clarify if shot has been approved or not.   [phone]

## 2022-06-28 ENCOUNTER — Ambulatory Visit: Payer: Medicare Other

## 2022-06-28 NOTE — Telephone Encounter (Signed)
Pt ready for scheduling on or after 06/18/22   Out-of-pocket cost due at time of visit: $475   Primary: BCBS [place] Medicare Adv HMO-POS Evenity co-insurance: 20% (approximately $450) Admin fee co-insurance: 20% (approximately $25)   Secondary: n/a Evenity co-insurance:  Admin fee co-insurance:    Deductible: does not apply   Prior Auth: NOT required PA# Valid:    ** This summary of benefits is an estimation of the patient's out-of-pocket cost. Exact cost may vary based on individual plan coverage.

## 2022-07-04 ENCOUNTER — Telehealth: Payer: Self-pay | Admitting: *Deleted

## 2022-07-04 ENCOUNTER — Ambulatory Visit (INDEPENDENT_AMBULATORY_CARE_PROVIDER_SITE_OTHER): Payer: Medicare Other | Admitting: *Deleted

## 2022-07-04 DIAGNOSIS — M810 Age-related osteoporosis without current pathological fracture: Secondary | ICD-10-CM

## 2022-07-04 MED ORDER — ROMOSOZUMAB-AQQG 105 MG/1.17ML ~~LOC~~ SOSY
210.0000 mg | PREFILLED_SYRINGE | Freq: Once | SUBCUTANEOUS | Status: AC
  Administered 2022-07-04: 210 mg via SUBCUTANEOUS

## 2022-07-04 NOTE — Telephone Encounter (Signed)
Called pt no answer LMOM w/MD response../lmb 

## 2022-07-04 NOTE — Telephone Encounter (Signed)
Pt came in for her monthly Evenity shot. She is wanting to ask MD does she need to be taking a calcium supplement if so how much.Marland KitchenAndee Poles

## 2022-07-04 NOTE — Telephone Encounter (Signed)
Calcium 1200 mg each day

## 2022-07-04 NOTE — Progress Notes (Signed)
Pls cosign for Evenity inj.Marland KitchenJohny Chess

## 2022-07-08 ENCOUNTER — Encounter (HOSPITAL_BASED_OUTPATIENT_CLINIC_OR_DEPARTMENT_OTHER): Payer: Self-pay | Admitting: Cardiology

## 2022-07-17 NOTE — Telephone Encounter (Signed)
Forwarding to Rx Prior Auth Team 

## 2022-08-03 ENCOUNTER — Ambulatory Visit (INDEPENDENT_AMBULATORY_CARE_PROVIDER_SITE_OTHER): Payer: Medicare Other

## 2022-08-03 DIAGNOSIS — M810 Age-related osteoporosis without current pathological fracture: Secondary | ICD-10-CM | POA: Diagnosis not present

## 2022-08-03 MED ORDER — ROMOSOZUMAB-AQQG 105 MG/1.17ML ~~LOC~~ SOSY
210.0000 mg | PREFILLED_SYRINGE | Freq: Once | SUBCUTANEOUS | Status: AC
  Administered 2022-08-03: 210 mg via SUBCUTANEOUS

## 2022-08-03 NOTE — Progress Notes (Signed)
Pt was given Evenity injection w/o any complications. 

## 2022-08-13 ENCOUNTER — Inpatient Hospital Stay: Payer: Medicare Other | Attending: Oncology

## 2022-08-13 DIAGNOSIS — D473 Essential (hemorrhagic) thrombocythemia: Secondary | ICD-10-CM | POA: Diagnosis not present

## 2022-08-13 DIAGNOSIS — D472 Monoclonal gammopathy: Secondary | ICD-10-CM

## 2022-08-13 LAB — CBC WITH DIFFERENTIAL (CANCER CENTER ONLY)
Abs Immature Granulocytes: 0.01 10*3/uL (ref 0.00–0.07)
Basophils Absolute: 0.1 10*3/uL (ref 0.0–0.1)
Basophils Relative: 1 %
Eosinophils Absolute: 0.1 10*3/uL (ref 0.0–0.5)
Eosinophils Relative: 1 %
HCT: 34.8 % — ABNORMAL LOW (ref 36.0–46.0)
Hemoglobin: 11.7 g/dL — ABNORMAL LOW (ref 12.0–15.0)
Immature Granulocytes: 0 %
Lymphocytes Relative: 23 %
Lymphs Abs: 1.4 10*3/uL (ref 0.7–4.0)
MCH: 31.5 pg (ref 26.0–34.0)
MCHC: 33.6 g/dL (ref 30.0–36.0)
MCV: 93.5 fL (ref 80.0–100.0)
Monocytes Absolute: 0.8 10*3/uL (ref 0.1–1.0)
Monocytes Relative: 13 %
Neutro Abs: 3.9 10*3/uL (ref 1.7–7.7)
Neutrophils Relative %: 62 %
Platelet Count: 280 10*3/uL (ref 150–400)
RBC: 3.72 MIL/uL — ABNORMAL LOW (ref 3.87–5.11)
RDW: 12.9 % (ref 11.5–15.5)
WBC Count: 6.3 10*3/uL (ref 4.0–10.5)
nRBC: 0 % (ref 0.0–0.2)

## 2022-08-13 LAB — CMP (CANCER CENTER ONLY)
ALT: 13 U/L (ref 0–44)
AST: 20 U/L (ref 15–41)
Albumin: 4 g/dL (ref 3.5–5.0)
Alkaline Phosphatase: 105 U/L (ref 38–126)
Anion gap: 3 — ABNORMAL LOW (ref 5–15)
BUN: 17 mg/dL (ref 8–23)
CO2: 29 mmol/L (ref 22–32)
Calcium: 9.6 mg/dL (ref 8.9–10.3)
Chloride: 97 mmol/L — ABNORMAL LOW (ref 98–111)
Creatinine: 0.63 mg/dL (ref 0.44–1.00)
GFR, Estimated: >60 mL/min (ref >60)
Glucose, Bld: 103 mg/dL — ABNORMAL HIGH (ref 70–99)
Potassium: 4.4 mmol/L (ref 3.5–5.1)
Sodium: 129 mmol/L — ABNORMAL LOW (ref 135–145)
Total Bilirubin: 0.6 mg/dL (ref 0.3–1.2)
Total Protein: 6.4 g/dL — ABNORMAL LOW (ref 6.5–8.1)

## 2022-08-14 LAB — IGM: IgM (Immunoglobulin M), Srm: 1181 mg/dL — ABNORMAL HIGH (ref 26–217)

## 2022-08-16 LAB — PROTEIN ELECTROPHORESIS, SERUM
A/G Ratio: 1.3 (ref 0.7–1.7)
Albumin ELP: 3.4 g/dL (ref 2.9–4.4)
Alpha-1-Globulin: 0.2 g/dL (ref 0.0–0.4)
Alpha-2-Globulin: 0.6 g/dL (ref 0.4–1.0)
Beta Globulin: 0.6 g/dL — ABNORMAL LOW (ref 0.7–1.3)
Gamma Globulin: 1.1 g/dL (ref 0.4–1.8)
Globulin, Total: 2.6 g/dL (ref 2.2–3.9)
M-Spike, %: 0.3 g/dL — ABNORMAL HIGH
Total Protein ELP: 6 g/dL (ref 6.0–8.5)

## 2022-09-06 ENCOUNTER — Ambulatory Visit (INDEPENDENT_AMBULATORY_CARE_PROVIDER_SITE_OTHER): Payer: Medicare Other

## 2022-09-06 ENCOUNTER — Encounter: Payer: Medicare Other | Admitting: Rheumatology

## 2022-09-06 DIAGNOSIS — M810 Age-related osteoporosis without current pathological fracture: Secondary | ICD-10-CM

## 2022-09-06 MED ORDER — ROMOSOZUMAB-AQQG 105 MG/1.17ML ~~LOC~~ SOSY
210.0000 mg | PREFILLED_SYRINGE | Freq: Once | SUBCUTANEOUS | Status: AC
  Administered 2022-09-06: 210 mg via SUBCUTANEOUS

## 2022-09-06 NOTE — Progress Notes (Signed)
Pt received evenity in both thighs today and responded well, but did state that it does sting.

## 2022-09-11 ENCOUNTER — Other Ambulatory Visit (HOSPITAL_BASED_OUTPATIENT_CLINIC_OR_DEPARTMENT_OTHER): Payer: Self-pay | Admitting: *Deleted

## 2022-09-11 DIAGNOSIS — J0100 Acute maxillary sinusitis, unspecified: Secondary | ICD-10-CM

## 2022-09-11 MED ORDER — AMOXICILLIN 500 MG PO TABS: ORAL_TABLET | ORAL | 1 refills | Status: AC

## 2022-09-13 ENCOUNTER — Ambulatory Visit: Payer: Medicare Other | Admitting: Nurse Practitioner

## 2022-09-18 DIAGNOSIS — K080 Exfoliation of teeth due to systemic causes: Secondary | ICD-10-CM | POA: Diagnosis not present

## 2022-09-27 ENCOUNTER — Ambulatory Visit: Payer: Medicare Other | Admitting: Rheumatology

## 2022-10-03 ENCOUNTER — Ambulatory Visit: Payer: Medicare Other

## 2022-10-04 ENCOUNTER — Telehealth: Payer: Self-pay

## 2022-10-04 NOTE — Telephone Encounter (Signed)
Called patient to schedule Medicare Annual Wellness Visit (AWV). Left message for patient to call back and schedule Medicare Annual Wellness Visit (AWV).  Today's AWV appointment needs to be rescheduled due to [facility] being out. PJR  Please schedule anytime  On Annual Wellness Visit Schedule.

## 2022-10-05 ENCOUNTER — Ambulatory Visit (INDEPENDENT_AMBULATORY_CARE_PROVIDER_SITE_OTHER): Payer: Medicare Other

## 2022-10-05 ENCOUNTER — Encounter: Payer: Self-pay | Admitting: Nurse Practitioner

## 2022-10-05 ENCOUNTER — Ambulatory Visit: Payer: Medicare Other | Admitting: Nurse Practitioner

## 2022-10-05 VITALS — BP 100/66 | HR 65 | Temp 98.4°F | Ht 63.0 in | Wt 97.0 lb

## 2022-10-05 DIAGNOSIS — D649 Anemia, unspecified: Secondary | ICD-10-CM | POA: Diagnosis not present

## 2022-10-05 DIAGNOSIS — R0602 Shortness of breath: Secondary | ICD-10-CM | POA: Diagnosis not present

## 2022-10-05 DIAGNOSIS — I340 Nonrheumatic mitral (valve) insufficiency: Secondary | ICD-10-CM | POA: Diagnosis not present

## 2022-10-05 DIAGNOSIS — R911 Solitary pulmonary nodule: Secondary | ICD-10-CM

## 2022-10-05 DIAGNOSIS — R0609 Other forms of dyspnea: Secondary | ICD-10-CM | POA: Diagnosis not present

## 2022-10-05 DIAGNOSIS — I480 Paroxysmal atrial fibrillation: Secondary | ICD-10-CM | POA: Insufficient documentation

## 2022-10-05 LAB — CBC WITH DIFFERENTIAL/PLATELET
Basophils Absolute: 0.1 10*3/uL (ref 0.0–0.1)
Basophils Relative: 1.1 % (ref 0.0–3.0)
Eosinophils Absolute: 0.1 10*3/uL (ref 0.0–0.7)
Eosinophils Relative: 0.7 % (ref 0.0–5.0)
HCT: 35.7 % — ABNORMAL LOW (ref 36.0–46.0)
Hemoglobin: 12.1 g/dL (ref 12.0–15.0)
Lymphocytes Relative: 19.7 % (ref 12.0–46.0)
Lymphs Abs: 1.4 10*3/uL (ref 0.7–4.0)
MCHC: 33.9 g/dL (ref 30.0–36.0)
MCV: 94.8 fl (ref 78.0–100.0)
Monocytes Absolute: 0.9 10*3/uL (ref 0.1–1.0)
Monocytes Relative: 13 % — ABNORMAL HIGH (ref 3.0–12.0)
Neutro Abs: 4.7 10*3/uL (ref 1.4–7.7)
Neutrophils Relative %: 65.5 % (ref 43.0–77.0)
Platelets: 346 10*3/uL (ref 150.0–400.0)
RBC: 3.77 Mil/uL — ABNORMAL LOW (ref 3.87–5.11)
RDW: 12.7 % (ref 11.5–15.5)
WBC: 7.2 10*3/uL (ref 4.0–10.5)

## 2022-10-05 LAB — BASIC METABOLIC PANEL
BUN: 18 mg/dL (ref 6–23)
CO2: 26 mEq/L (ref 19–32)
Calcium: 8.9 mg/dL (ref 8.4–10.5)
Chloride: 98 mEq/L (ref 96–112)
Creatinine, Ser: 0.68 mg/dL (ref 0.40–1.20)
GFR: 82.71 mL/min (ref >60.00)
Glucose, Bld: 90 mg/dL (ref 70–99)
Potassium: 4.7 mEq/L (ref 3.5–5.1)
Sodium: 129 mEq/L — ABNORMAL LOW (ref 135–145)

## 2022-10-05 MED ORDER — ALBUTEROL SULFATE HFA 108 (90 BASE) MCG/ACT IN AERS: 2.0000 | INHALATION_SPRAY | Freq: Four times a day (QID) | RESPIRATORY_TRACT | 2 refills | Status: DC | PRN

## 2022-10-05 NOTE — Assessment & Plan Note (Signed)
S/p MVR. Stable on echo from January 2023. May consider repeat echo based on above findings.

## 2022-10-05 NOTE — Assessment & Plan Note (Signed)
See above

## 2022-10-05 NOTE — Progress Notes (Signed)
@Patient  ID: Helen Davis, female    DOB: 17-Sep-1940, 82 y.o.   MRN: [id number]  Chief Complaint  Patient presents with   Follow-up    Pt states her sob has been coming and going.     Referring provider: Etta Grandchild, MD  HPI: 82 year old female, former remote smoker followed for lung nodule.  She is patient Dr. Kavin Leech and last seen in office 06/14/2022.  Past medical history significant for severe mitral valve regurgitation status post MVR, history of postop PAF on Eliquis, DJD, osteoarthritis, HLD, MGUS, history of breast cancer.  TEST/EVENTS:  06/04/2022 CT chest without contrast: Prosthetic mitral valve.  Minimal atherosclerotic calcified plaque.  Small hiatal hernia.  Ill-defined area of groundglass with some bandlike changes in the right upper lobe, stable.  Central cavitary area as well.  Overall opacity similar in appearance and measures 2.5 x 1.6 cm.  Essentially unchanged.  Also some middle lobe bronchiectasis with some opacity along the bronchus measuring 7 x 3 mm, again unchanged.  Benign hepatic cyst.  Prominent colonic stool.  Old rib fractures.  Degeneration of the spine.  06/14/2022: OV with Dr. Delton Coombes.  Initially referred in July 2023 following incidental finding of a 2.4 x 1.6 subsolid right upper lobe pulmonary nodule on CT imaging during workup for MVR.  Repeat CT chest June 04, 2022 with stable nodular area.  Also some mild right middle lobe mild bronchiectasis.  Differential diagnosis including possibly a slow-growing adenocarcinoma, MAIC, other autoimmune process.  No compelling reason to perform bronchoscopy at this time.  Continue surveillance.  Will have repeat CT chest in 1 year.  Follow-up if she develops any new respiratory symptoms.  10/05/2022: Today - acute Patient presents today for acute visit.  She scheduled the appointment because she has been having some trouble with her breathing over the past few months.  She actually feels okay today.  Tells me  that sometime in March, she started noticing that she would randomly have episodes of shortness of breath.  She would also become very lightheaded during these episodes.  No clear association with exertion.  She does have to sit down during them with the dizziness, and then tends to feel better after resting for short period of time.  Last time it happened was last week when she was just standing in line at the [employer].  She is wondering if it is anxiety related although she does not necessarily feel super anxious in the moment.  She has been having some more life stressors recently.  She walks 3 to 5 miles a day without any difficulty.  She will sometimes feel like she has more trouble walking up a hill but otherwise does well.  She does feel like sometimes when she has to take a deep breath she coughs.  She will produce a small amount of clear phlegm.  Has not noticed any recent episodes of palpitations.  She denies any wheezing, orthopnea, hemoptysis, weight loss, anorexia, fevers, chills, night sweats, increased lower extremity swelling, chest pain, syncope.  She is a former remote smoker.  She quit in 1974 and would only smoked socially.  Never smoked over a quart of a pack a day.  She was exposed to significant secondhand smoke.  Has been told in the past that she has COPD but never had previous formal pulmonary function testing for this.  She had an inhaler years ago but has not had one recently.  She does have a history of postoperative atrial  fibrillation.  She is on metoprolol as well as Eliquis.  She had labs in March which showed a mild anemia with hemoglobin 11.7.  Denies any bleeding or excessive bruising.  Allergies  Allergen Reactions   Covid-19 Mrna Vacc (Moderna) Rash    Patient states 1 week after moderna had severe blistering rash from left shoulder down to left elbow, will not get second shot    Immunization History  Administered Date(s) Administered   Pneumococcal Conjugate-13  02/12/2018   Pneumococcal Polysaccharide-23 02/16/2019   Td 09/12/2021   Tdap 09/18/2009    Past Medical History:  Diagnosis Date   ABNORMAL EKG    Anemia    Breast cancer (HCC)    Fatigue    Mitral valve disorders(424.0)    Vitamin D deficiency     Tobacco History: Social History   Tobacco Use  Smoking Status Former   Types: Cigarettes   Quit date: 05/21/1972   Years since quitting: 50.4   Passive exposure: Current  Smokeless Tobacco Never   Counseling given: Not Answered   Outpatient Medications Prior to Visit  Medication Sig Dispense Refill   Acetaminophen (TYLENOL EXTRA STRENGTH PO) Take by mouth.     amoxicillin (AMOXIL) 500 MG tablet Take 4 tablets 1 hour prior to dental work. 4 tablet 1   apixaban (ELIQUIS) 5 MG TABS tablet Take 1 tablet (5 mg total) by mouth 2 (two) times daily. 180 tablet 1   Bioflavonoid Products (ESTER-C) 500-550 MG TABS Take 500-1,000 mg by mouth daily.     Cholecalciferol (VITAMIN D PO) Take 4,000 Int'l Units by mouth daily.     Cyanocobalamin (VITAMIN B 12 PO) Take 1,000 mcg by mouth daily. Timed release     MAGNESIUM PO Take 500 mg by mouth at bedtime.     metoprolol tartrate (LOPRESSOR) 25 MG tablet Take 0.5 tablets (12.5 mg total) by mouth 2 (two) times daily. 90 tablet 3   Multiple Vitamins-Minerals (CENTRUM SILVER PO) Take 1 tablet by mouth daily. With iron     Zinc 30 MG TABS Take 30 mg by mouth daily. PRN     No facility-administered medications prior to visit.     Review of Systems:   Constitutional: No weight loss or gain, night sweats, fevers, chills, fatigue, or lassitude. HEENT: No headaches, difficulty swallowing, tooth/dental problems, or sore throat. No sneezing, itching, ear ache, nasal congestion, or post nasal drip CV:  +dizziness/lightheadedness. No chest pain, orthopnea, PND, swelling in lower extremities, anasarca, dizziness, palpitations, syncope Resp: +episodes of shortness of breath with exertion/rest; occasional  cough. No excess mucus or change in color of mucus. No hemoptysis. No wheezing.  No chest wall deformity GI:  No heartburn, indigestion, abdominal pain, nausea, vomiting, diarrhea, change in bowel habits, loss of appetite, bloody stools.  GU: No dysuria, change in color of urine, urgency or frequency.  No flank pain, no hematuria  Skin: No rash, lesions, ulcerations MSK:  No joint pain or swelling.  No decreased range of motion.  No back pain. Neuro: No gait abnormalities. No numbness/tingling.  Psych: No depression or anxiety. Mood stable.     Physical Exam:  BP 100/66   Pulse 65   Temp 98.4 F (36.9 C) (Oral)   Ht 5\' 3"  (1.6 m)   Wt 97 lb (44 kg)   LMP  (LMP Unknown)   SpO2 99%   BMI 17.18 kg/m   GEN: Pleasant, interactive, well-appearing; in no acute distress HEENT:  Normocephalic and atraumatic. PERRLA. Sclera  white. Nasal turbinates pink, moist and patent bilaterally. No rhinorrhea present. Oropharynx pink and moist, without exudate or edema. No lesions, ulcerations, or postnasal drip.  NECK:  Supple w/ fair ROM. No JVD present. Normal carotid impulses w/o bruits. Thyroid symmetrical with no goiter or nodules palpated. No lymphadenopathy.   CV: RRR, no m/r/g, no peripheral edema. Pulses intact, +2 bilaterally. No cyanosis, pallor or clubbing. PULMONARY:  Unlabored, regular breathing. Clear bilaterally A&P w/o wheezes/rales/rhonchi. No accessory muscle use.  GI: BS present and normoactive. Soft, non-tender to palpation. No organomegaly or masses detected. MSK: No erythema, warmth or tenderness. Cap refil <2 sec all extrem. No deformities or joint swelling noted.  Neuro: A/Ox3. No focal deficits noted.   Skin: Warm, no lesions or rashe Psych: Normal affect and behavior. Judgement and thought content appropriate.     Lab Results:  CBC    Component Value Date/Time   WBC 7.2 10/05/2022 1509   RBC 3.77 (L) 10/05/2022 1509   HGB 12.1 10/05/2022 1509   HGB 11.7 (L) 08/13/2022  1412   HGB 11.5 07/10/2021 1435   HCT 35.7 (L) 10/05/2022 1509   HCT 35.0 07/10/2021 1435   PLT 346.0 10/05/2022 1509   PLT 280 08/13/2022 1412   PLT 331 07/10/2021 1435   MCV 94.8 10/05/2022 1509   MCV 92 07/10/2021 1435   MCH 31.5 08/13/2022 1412   MCHC 33.9 10/05/2022 1509   RDW 12.7 10/05/2022 1509   RDW 12.1 07/10/2021 1435   LYMPHSABS 1.4 10/05/2022 1509   LYMPHSABS 1.2 04/28/2021 1036   MONOABS 0.9 10/05/2022 1509   EOSABS 0.1 10/05/2022 1509   EOSABS 0.1 04/28/2021 1036   BASOSABS 0.1 10/05/2022 1509   BASOSABS 0.1 04/28/2021 1036    BMET    Component Value Date/Time   NA 129 (L) 10/05/2022 1509   NA 137 07/10/2021 1435   K 4.7 10/05/2022 1509   CL 98 10/05/2022 1509   CO2 26 10/05/2022 1509   GLUCOSE 90 10/05/2022 1509   BUN 18 10/05/2022 1509   BUN 22 07/10/2021 1435   CREATININE 0.68 10/05/2022 1509   CREATININE 0.63 08/13/2022 1412   CREATININE 0.67 03/21/2022 1150   CALCIUM 8.9 10/05/2022 1509   GFRNONAA >60 08/13/2022 1412   GFRNONAA 85 08/04/2020 1437   GFRAA 99 08/04/2020 1437    BNP No results found for: "BNP"   Imaging:  DG Chest 2 View  Result Date: 10/05/2022 CLINICAL DATA:  Short of breath EXAM: CHEST - 2 VIEW COMPARISON:  11/16/2019, 06/04/2022 FINDINGS: Frontal and lateral views of the chest demonstrate postsurgical changes from mitral valve replacement. Cardiac silhouette is unremarkable. Lungs are hyperexpanded, without acute airspace disease, effusion, or pneumothorax. No acute bony abnormalities. IMPRESSION: 1. No acute intrathoracic process. 2. Stable hyperinflation. 3. The spiculated ground-glass nodule seen within the right upper lobe on prior CT is not well visualized by x-ray. Please refer to prior CT report 06/04/2022 for follow-up recommendations. Electronically Signed   By: Sharlet Salina M.D.   On: 10/05/2022 15:33    Romosozumab-aqqg (EVENITY) 105 MG/1. injection 210 mg     Date Action Dose Route User   09/06/2022 1527  Given 210 mg Subcutaneous (Other) Aileen Pilot S, CMA           No data to display          No results found for: "NITRICOXIDE"      Assessment & Plan:   Shortness of breath Unclear etiology at this point. I am  concerned this could be related to underlying arrhythmia given her history and constellation of symptoms. Will send message to Dr. Cristal Deer to see if we can get her set up with Zio monitor. Check BNP/BMET today. CXR without acute process today. Lung exam clear today. She has a remote smoking history but significant secondhand smoke exposure. Will obtain PFTs to evaluate for smoking related obstructive lung disease. Trial albuterol to see if she has any perceived benefit from bronchodilators. She has mild BTX; no infectious symptoms. Wouldn't suspect this to be the cause given stability on imaging. She does have a history of anemia; will recheck CBC to rule out worsening. If no obvious cause, could consider repeat CT chest given her lung nodule; however, this was stable on imaging from January and no concerning symptoms of hemoptysis, anorexia, weight loss, fatigue. Walk test today without desaturations; maintained >98% on room air.   Patient Instructions  Trial albuterol 2 puffs every 6 hours as needed for shortness of breath or wheezing   Labs today Chest x ray today  You will have pulmonary function testing when you return  I think you need to have evaluation of your heart rhythm with a heart monitor. I will reach out to Dr. Cristal Deer regarding this.  Follow up after PFTs with Dr. Delton Coombes (1st) or Katie Annella Prowell,NP. If symptoms do not improve or worsen, please contact office for sooner follow up or seek emergency care.    Pulmonary nodule See above  PAF (paroxysmal atrial fibrillation) (HCC) Regular rate and rhythm at OV today. See above plan.  Nonrheumatic mitral valve regurgitation S/p MVR. Stable on echo from January 2023. May consider repeat echo based on  above findings.   I spent 45 minutes of dedicated to the care of this patient on the date of this encounter to include pre-visit review of records, face-to-face time with the patient discussing conditions above, post visit ordering of testing, clinical documentation with the electronic health record, making appropriate referrals as documented, and communicating necessary findings to members of the patients care team.  Noemi Chapel, NP 10/05/2022  Pt aware and understands NP's role.

## 2022-10-05 NOTE — Patient Instructions (Signed)
Trial albuterol 2 puffs every 6 hours as needed for shortness of breath or wheezing   Labs today Chest x ray today  You will have pulmonary function testing when you return  I think you need to have evaluation of your heart rhythm with a heart monitor. I will reach out to Dr. Cristal Deer regarding this.  Follow up after PFTs with Dr. Delton Coombes (1st) or Katie Nijah Tejera,NP. If symptoms do not improve or worsen, please contact office for sooner follow up or seek emergency care.

## 2022-10-05 NOTE — Assessment & Plan Note (Signed)
Regular rate and rhythm at OV today. See above plan.

## 2022-10-05 NOTE — Assessment & Plan Note (Addendum)
Unclear etiology at this point. I am concerned this could be related to underlying arrhythmia given her history and constellation of symptoms. Will send message to Dr. Cristal Deer to see if we can get her set up with Zio monitor. Check BNP/BMET today. CXR without acute process today. Lung exam clear today. She has a remote smoking history but significant secondhand smoke exposure. Will obtain PFTs to evaluate for smoking related obstructive lung disease. Trial albuterol to see if she has any perceived benefit from bronchodilators. She has mild BTX; no infectious symptoms. Wouldn't suspect this to be the cause given stability on imaging. She does have a history of anemia; will recheck CBC to rule out worsening. If no obvious cause, could consider repeat CT chest given her lung nodule; however, this was stable on imaging from January and no concerning symptoms of hemoptysis, anorexia, weight loss, fatigue. Walk test today without desaturations; maintained >98% on room air.   Patient Instructions  Trial albuterol 2 puffs every 6 hours as needed for shortness of breath or wheezing   Labs today Chest x ray today  You will have pulmonary function testing when you return  I think you need to have evaluation of your heart rhythm with a heart monitor. I will reach out to Dr. Cristal Deer regarding this.  Follow up after PFTs with Dr. Delton Coombes (1st) or Katie Torryn Fiske,NP. If symptoms do not improve or worsen, please contact office for sooner follow up or seek emergency care.

## 2022-10-06 LAB — BRAIN NATRIURETIC PEPTIDE: Brain Natriuretic Peptide: 191 pg/mL — ABNORMAL HIGH (ref <100)

## 2022-10-08 ENCOUNTER — Encounter: Payer: Self-pay | Admitting: Internal Medicine

## 2022-10-08 ENCOUNTER — Telehealth: Payer: Self-pay | Admitting: Internal Medicine

## 2022-10-08 DIAGNOSIS — H524 Presbyopia: Secondary | ICD-10-CM | POA: Diagnosis not present

## 2022-10-08 NOTE — Telephone Encounter (Signed)
Patient wants to know if her 4th evenity shot has been approved and if so she would like to schedule it.  Please call patient and let her know:  [phone]

## 2022-10-09 NOTE — Telephone Encounter (Addendum)
Per BIV in January, no PA is required. Initiated new Evenity VOB via [email or web address] to make sure this is correct.

## 2022-10-11 NOTE — Telephone Encounter (Signed)
No PA required

## 2022-10-12 ENCOUNTER — Ambulatory Visit (INDEPENDENT_AMBULATORY_CARE_PROVIDER_SITE_OTHER): Payer: Medicare Other

## 2022-10-12 DIAGNOSIS — M810 Age-related osteoporosis without current pathological fracture: Secondary | ICD-10-CM | POA: Diagnosis not present

## 2022-10-12 MED ORDER — ROMOSOZUMAB-AQQG 105 MG/1.17ML ~~LOC~~ SOSY
210.0000 mg | PREFILLED_SYRINGE | Freq: Once | SUBCUTANEOUS | Status: AC
Start: 2022-10-12 — End: 2022-10-12
  Administered 2022-10-12: 210 mg via SUBCUTANEOUS

## 2022-10-12 NOTE — Progress Notes (Signed)
Pt was given Evenity injections w/o any complications. 

## 2022-10-16 ENCOUNTER — Inpatient Hospital Stay: Payer: Medicare Other | Attending: Oncology

## 2022-10-16 ENCOUNTER — Inpatient Hospital Stay: Payer: Medicare Other | Admitting: Oncology

## 2022-10-16 VITALS — BP 91/61 | HR 74 | Temp 98.1°F | Resp 18 | Ht 63.0 in | Wt 96.9 lb

## 2022-10-16 DIAGNOSIS — D472 Monoclonal gammopathy: Secondary | ICD-10-CM

## 2022-10-16 DIAGNOSIS — D473 Essential (hemorrhagic) thrombocythemia: Secondary | ICD-10-CM | POA: Insufficient documentation

## 2022-10-16 LAB — CBC WITH DIFFERENTIAL (CANCER CENTER ONLY)
Abs Immature Granulocytes: 0.01 10*3/uL (ref 0.00–0.07)
Basophils Absolute: 0.1 10*3/uL (ref 0.0–0.1)
Basophils Relative: 1 %
Eosinophils Absolute: 0.1 10*3/uL (ref 0.0–0.5)
Eosinophils Relative: 2 %
HCT: 34.1 % — ABNORMAL LOW (ref 36.0–46.0)
Hemoglobin: 11.6 g/dL — ABNORMAL LOW (ref 12.0–15.0)
Immature Granulocytes: 0 %
Lymphocytes Relative: 21 %
Lymphs Abs: 1.3 10*3/uL (ref 0.7–4.0)
MCH: 32.1 pg (ref 26.0–34.0)
MCHC: 34 g/dL (ref 30.0–36.0)
MCV: 94.5 fL (ref 80.0–100.0)
Monocytes Absolute: 0.8 10*3/uL (ref 0.1–1.0)
Monocytes Relative: 14 %
Neutro Abs: 3.7 10*3/uL (ref 1.7–7.7)
Neutrophils Relative %: 62 %
Platelet Count: 300 10*3/uL (ref 150–400)
RBC: 3.61 MIL/uL — ABNORMAL LOW (ref 3.87–5.11)
RDW: 12.9 % (ref 11.5–15.5)
WBC Count: 6 10*3/uL (ref 4.0–10.5)
nRBC: 0 % (ref 0.0–0.2)

## 2022-10-16 LAB — CMP (CANCER CENTER ONLY)
ALT: 12 U/L (ref 0–44)
AST: 17 U/L (ref 15–41)
Albumin: 3.9 g/dL (ref 3.5–5.0)
Alkaline Phosphatase: 90 U/L (ref 38–126)
Anion gap: 7 (ref 5–15)
BUN: 11 mg/dL (ref 8–23)
CO2: 25 mmol/L (ref 22–32)
Calcium: 8.9 mg/dL (ref 8.9–10.3)
Chloride: 98 mmol/L (ref 98–111)
Creatinine: 0.67 mg/dL (ref 0.44–1.00)
GFR, Estimated: >60 mL/min (ref >60)
Glucose, Bld: 91 mg/dL (ref 70–99)
Potassium: 4 mmol/L (ref 3.5–5.1)
Sodium: 130 mmol/L — ABNORMAL LOW (ref 135–145)
Total Bilirubin: 0.6 mg/dL (ref 0.3–1.2)
Total Protein: 6.4 g/dL — ABNORMAL LOW (ref 6.5–8.1)

## 2022-10-16 NOTE — Progress Notes (Signed)
  [facility] OFFICE PROGRESS NOTE   Diagnosis: Serum monoclonal protein  INTERVAL HISTORY:   Helen Davis returns as scheduled.  She feels well.  Good appetite.  No fever or night sweats.  No recent infection.  Objective:  Vital signs in last 24 hours:  Blood pressure 91/61, pulse 74, temperature 98.1 F (36.7 C), temperature source Oral, resp. rate 18, height 5\' 3"  (1.6 m), weight 96 lb 14.4 oz (44 kg), SpO2 100 %.     Lymphatics: No cervical, supraclavicular, axillary, right inguinal nodes.  Pea-sized left inguinal node Resp: Lungs clear bilaterally Cardio: Regular rate and rhythm GI: No hepatosplenomegaly Vascular: No leg edema   Lab Results:  Lab Results  Component Value Date   WBC 6.0 10/16/2022   HGB 11.6 (L) 10/16/2022   HCT 34.1 (L) 10/16/2022   MCV 94.5 10/16/2022   PLT 300 10/16/2022   NEUTROABS 3.7 10/16/2022    CMP  Lab Results  Component Value Date   NA 129 (L) 10/05/2022   K 4.7 10/05/2022   CL 98 10/05/2022   CO2 26 10/05/2022   GLUCOSE 90 10/05/2022   BUN 18 10/05/2022   CREATININE 0.68 10/05/2022   CALCIUM 8.9 10/05/2022   PROT 6.4 (L) 08/13/2022   ALBUMIN 4.0 08/13/2022   AST 20 08/13/2022   ALT 13 08/13/2022   ALKPHOS 105 08/13/2022   BILITOT 0.6 08/13/2022   GFRNONAA >60 08/13/2022   GFRAA 99 08/04/2020    Medications: I have reviewed the patient's current medications.   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  There  is no clinical evidence for progression to multiple myeloma or another lymphoproliferative disorder.  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  The IgM level was stable in March.  We will follow-up on the myeloma panel from today.  She will return for an office and lab visit in 6 months.  Thornton Papas, MD  10/16/2022  2:58 PM

## 2022-10-17 ENCOUNTER — Ambulatory Visit (INDEPENDENT_AMBULATORY_CARE_PROVIDER_SITE_OTHER): Payer: Medicare Other

## 2022-10-17 VITALS — BP 100/60 | HR 65 | Temp 98.2°F | Ht 63.0 in | Wt 98.0 lb

## 2022-10-17 DIAGNOSIS — Z0000 Encounter for general adult medical examination without abnormal findings: Secondary | ICD-10-CM | POA: Diagnosis not present

## 2022-10-17 LAB — IGM: IgM (Immunoglobulin M), Srm: 1072 mg/dL — ABNORMAL HIGH (ref 26–217)

## 2022-10-17 LAB — KAPPA/LAMBDA LIGHT CHAINS
Kappa free light chain: 38.9 mg/L — ABNORMAL HIGH (ref 3.3–19.4)
Kappa, lambda light chain ratio: 2.42 — ABNORMAL HIGH (ref 0.26–1.65)
Lambda free light chains: 16.1 mg/L (ref 5.7–26.3)

## 2022-10-17 NOTE — Progress Notes (Signed)
BNP was elevated, which is a sign that the heart is pumping harder than it should have to and could be contributing to her shortness of breath. We can discuss further at her follow up. Did she ever schedule an appt with cardiology for heart monitor? We had discussed this at her last visit. I have not heard back from Dr. Cristal Deer so recommend she reach out to their office if she doesn't have an upcoming appt and still having symptoms. Thanks!

## 2022-10-17 NOTE — Patient Instructions (Addendum)
Ms. Helen Davis , Thank you for taking time to come for your Medicare Wellness Visit. I appreciate your ongoing commitment to your health goals. Please review the following plan we discussed and let me know if I can assist you in the future.   These are the goals we discussed:  Goals      My goal for 2024 is to maintain my health, stay well, stay independent , keep walking and visit my grandson.        This is a list of the screening recommended for you and due dates:  Health Maintenance  Topic Date Due   COVID-19 Vaccine (1) Never done   Hepatitis C Screening  Never done   Zoster (Shingles) Vaccine (1 of 2) Never done   Flu Shot  12/20/2022   Medicare Annual Wellness Visit  10/17/2023   DTaP/Tdap/Td vaccine (3 - Td or Tdap) 09/13/2031   Pneumonia Vaccine  Completed   DEXA scan (bone density measurement)  Completed   HPV Vaccine  Aged Out    Advanced directives: Yes  Conditions/risks identified: Yes  Next appointment: Follow up in one year for your annual wellness visit.   Preventive Care 41 Years and Older, Female Preventive care refers to lifestyle choices and visits with your health care provider that can promote health and wellness. What does preventive care include? A yearly physical exam. This is also called an annual well check. Dental exams once or twice a year. Routine eye exams. Ask your health care provider how often you should have your eyes checked. Personal lifestyle choices, including: Daily care of your teeth and gums. Regular physical activity. Eating a healthy diet. Avoiding tobacco and drug use. Limiting alcohol use. Practicing safe sex. Taking low-dose aspirin every day. Taking vitamin and mineral supplements as recommended by your health care provider. What happens during an annual well check? The services and screenings done by your health care provider during your annual well check will depend on your age, overall health, lifestyle risk factors, and  family history of disease. Counseling  Your health care provider may ask you questions about your: Alcohol use. Tobacco use. Drug use. Emotional well-being. Home and relationship well-being. Sexual activity. Eating habits. History of falls. Memory and ability to understand (cognition). Work and work Astronomer. Reproductive health. Screening  You may have the following tests or measurements: Height, weight, and BMI. Blood pressure. Lipid and cholesterol levels. These may be checked every 5 years, or more frequently if you are over 3 years old. Skin check. Lung cancer screening. You may have this screening every year starting at age 40 if you have a 30-pack-year history of smoking and currently smoke or have quit within the past 15 years. Fecal occult blood test (FOBT) of the stool. You may have this test every year starting at age 25. Flexible sigmoidoscopy or colonoscopy. You may have a sigmoidoscopy every 5 years or a colonoscopy every 10 years starting at age 62. Hepatitis C blood test. Hepatitis B blood test. Sexually transmitted disease (STD) testing. Diabetes screening. This is done by checking your blood sugar (glucose) after you have not eaten for a while (fasting). You may have this done every 1-3 years. Bone density scan. This is done to screen for osteoporosis. You may have this done starting at age 32. Mammogram. This may be done every 1-2 years. Talk to your health care provider about how often you should have regular mammograms. Talk with your health care provider about your test results, treatment  options, and if necessary, the need for more tests. Vaccines  Your health care provider may recommend certain vaccines, such as: Influenza vaccine. This is recommended every year. Tetanus, diphtheria, and acellular pertussis (Tdap, Td) vaccine. You may need a Td booster every 10 years. Zoster vaccine. You may need this after age 31. Pneumococcal 13-valent conjugate  (PCV13) vaccine. One dose is recommended after age 4. Pneumococcal polysaccharide (PPSV23) vaccine. One dose is recommended after age 69. Talk to your health care provider about which screenings and vaccines you need and how often you need them. This information is not intended to replace advice given to you by your health care provider. Make sure you discuss any questions you have with your health care provider. Document Released: 06/03/2015 Document Revised: 01/25/2016 Document Reviewed: 03/08/2015 Elsevier Interactive Patient Education  2017 [employer].  Fall Prevention in the Home Falls can cause injuries. They can happen to people of all ages. There are many things you can do to make your home safe and to help prevent falls. What can I do on the outside of my home? Regularly fix the edges of walkways and driveways and fix any cracks. Remove anything that might make you trip as you walk through a door, such as a raised step or threshold. Trim any bushes or trees on the path to your home. Use bright outdoor lighting. Clear any walking paths of anything that might make someone trip, such as rocks or tools. Regularly check to see if handrails are loose or broken. Make sure that both sides of any steps have handrails. Any raised decks and porches should have guardrails on the edges. Have any leaves, snow, or ice cleared regularly. Use sand or salt on walking paths during winter. Clean up any spills in your garage right away. This includes oil or grease spills. What can I do in the bathroom? Use night lights. Install grab bars by the toilet and in the tub and shower. Do not use towel bars as grab bars. Use non-skid mats or decals in the tub or shower. If you need to sit down in the shower, use a plastic, non-slip stool. Keep the floor dry. Clean up any water that spills on the floor as soon as it happens. Remove soap buildup in the tub or shower regularly. Attach bath mats securely with  double-sided non-slip rug tape. Do not have throw rugs and other things on the floor that can make you trip. What can I do in the bedroom? Use night lights. Make sure that you have a light by your bed that is easy to reach. Do not use any sheets or blankets that are too big for your bed. They should not hang down onto the floor. Have a firm chair that has side arms. You can use this for support while you get dressed. Do not have throw rugs and other things on the floor that can make you trip. What can I do in the kitchen? Clean up any spills right away. Avoid walking on wet floors. Keep items that you use a lot in easy-to-reach places. If you need to reach something above you, use a strong step stool that has a grab bar. Keep electrical cords out of the way. Do not use floor polish or wax that makes floors slippery. If you must use wax, use non-skid floor wax. Do not have throw rugs and other things on the floor that can make you trip. What can I do with my stairs? Do not  leave any items on the stairs. Make sure that there are handrails on both sides of the stairs and use them. Fix handrails that are broken or loose. Make sure that handrails are as long as the stairways. Check any carpeting to make sure that it is firmly attached to the stairs. Fix any carpet that is loose or worn. Avoid having throw rugs at the top or bottom of the stairs. If you do have throw rugs, attach them to the floor with carpet tape. Make sure that you have a light switch at the top of the stairs and the bottom of the stairs. If you do not have them, ask someone to add them for you. What else can I do to help prevent falls? Wear shoes that: Do not have high heels. Have rubber bottoms. Are comfortable and fit you well. Are closed at the toe. Do not wear sandals. If you use a stepladder: Make sure that it is fully opened. Do not climb a closed stepladder. Make sure that both sides of the stepladder are locked  into place. Ask someone to hold it for you, if possible. Clearly mark and make sure that you can see: Any grab bars or handrails. First and last steps. Where the edge of each step is. Use tools that help you move around (mobility aids) if they are needed. These include: Canes. Walkers. Scooters. Crutches. Turn on the lights when you go into a dark area. Replace any light bulbs as soon as they burn out. Set up your furniture so you have a clear path. Avoid moving your furniture around. If any of your floors are uneven, fix them. If there are any pets around you, be aware of where they are. Review your medicines with your doctor. Some medicines can make you feel dizzy. This can increase your chance of falling. Ask your doctor what other things that you can do to help prevent falls. This information is not intended to replace advice given to you by your health care provider. Make sure you discuss any questions you have with your health care provider. Document Released: 03/03/2009 Document Revised: 10/13/2015 Document Reviewed: 06/11/2014 Elsevier Interactive Patient Education  2017 [employer].

## 2022-10-17 NOTE — Progress Notes (Signed)
Patient Medicare AWV questionnaire was completed by the patient on 10/13/2022; I have confirmed that all information answered by patient is correct and no changes since this date.    Subjective:   Helen Davis is a 82 y.o. female who presents for Medicare Annual (Subsequent) preventive examination.  Review of Systems     Cardiac Risk Factors include: advanced age (>36men, >16 women);family history of premature cardiovascular disease     Objective:    Today's Vitals   10/17/22 1432  BP: 100/60  Pulse: 65  Temp: 98.2 F (36.8 C)  TempSrc: Temporal  SpO2: 98%  Weight: 98 lb (44.5 kg)  Height: 5\' 3"  (1.6 m)  PainSc: 0-No pain   Body mass index is 17.36 kg/m.     10/17/2022    2:33 PM 10/16/2022    3:18 PM 04/17/2022    1:49 PM 09/12/2021    2:45 PM 05/05/2021    6:01 AM 02/09/2021   12:22 PM 09/09/2019    4:25 PM  Advanced Directives  Does Patient Have a Medical Advance Directive? Yes Yes Yes Yes Yes Yes Yes  Type of Estate agent of [place];Living will  Healthcare Power of [place];Living will Healthcare Power of [place];Living will Healthcare Power of [place];Living will Healthcare Power of [place];Living will Living will;Healthcare Power of Attorney  Does patient want to make changes to medical advance directive?  No - Patient declined No - Patient declined No - Patient declined No - Patient declined  No - Patient declined  Copy of Healthcare Power of Attorney in Chart? No - copy requested  No - copy requested No - copy requested No - copy requested      Current Medications (verified) Outpatient Encounter Medications as of 10/17/2022  Medication Sig   Acetaminophen (TYLENOL EXTRA STRENGTH PO) Take by mouth.   albuterol (VENTOLIN HFA) 108 (90 Base) MCG/ACT inhaler Inhale 2 puffs into the lungs every 6 (six) hours as needed for wheezing or shortness of breath.   amoxicillin (AMOXIL) 500 MG tablet Take 4 tablets 1 hour prior to dental work.    apixaban (ELIQUIS) 5 MG TABS tablet Take 1 tablet (5 mg total) by mouth 2 (two) times daily.   Bioflavonoid Products (ESTER-C) 500-550 MG TABS Take 500-1,000 mg by mouth daily.   Cholecalciferol (VITAMIN D PO) Take 4,000 Int'l Units by mouth daily.   Cyanocobalamin (VITAMIN B 12 PO) Take 1,000 mcg by mouth daily. Timed release   MAGNESIUM PO Take 500 mg by mouth at bedtime.   metoprolol tartrate (LOPRESSOR) 25 MG tablet Take 0.5 tablets (12.5 mg total) by mouth 2 (two) times daily.   Multiple Vitamins-Minerals (CENTRUM SILVER PO) Take 1 tablet by mouth daily. With iron   Zinc 30 MG TABS Take 30 mg by mouth daily. PRN   No facility-administered encounter medications on file as of 10/17/2022.    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   RIGHT/LEFT HEART CATH AND CORONARY ANGIOGRAPHY N/A 05/05/2021   Procedure: RIGHT/LEFT HEART CATH AND CORONARY ANGIOGRAPHY;  Surgeon: Tonny Bollman, MD;  Location: [facility] INVASIVE CV LAB;  Service: Cardiovascular;  Laterality:  N/A;   TEE WITHOUT CARDIOVERSION N/A 02/09/2021   Procedure: TRANSESOPHAGEAL ECHOCARDIOGRAM (TEE);  Surgeon: Thurmon Fair, MD;  Location: [facility] ENDOSCOPY;  Service: Cardiovascular;  Laterality: N/A;   VARICOSE VEIN SURGERY Right 1975   Family History  Problem Relation Age of Onset   Atrial fibrillation Mother    Stroke Mother    Emphysema Father    Dementia Father    Heart disease Sister        CHF   Hypertension Other    Stroke Other    Cancer Other    Healthy Son    Healthy Son    Social History   Socioeconomic History   Marital status: Single    Spouse name: Not on file   Number of children: Not on file   Years  of education: Not on file   Highest education level: Not on file  Occupational History   Not on file  Tobacco Use   Smoking status: Former    Types: Cigarettes    Quit date: 05/21/1972    Years since quitting: 50.4    Passive exposure: Current   Smokeless tobacco: Never  Vaping Use   Vaping Use: Never used  Substance and Sexual Activity   Alcohol use: Yes    Alcohol/week: 3.0 standard drinks of alcohol    Types: 3 Glasses of wine per week    Comment: occ   Drug use: No   Sexual activity: Not on file  Other Topics Concern   Not on file  Social History Narrative   Not on file   Social Determinants of Health   Financial Resource Strain: Low Risk  (10/17/2022)   Overall Financial Resource Strain (CARDIA)    Difficulty of Paying Living Expenses: Not very hard  Food Insecurity: No Food Insecurity (10/17/2022)   Hunger Vital Sign    Worried About Running Out of Food in the Last Year: Never true    Ran Out of Food in the Last Year: Never true  Transportation Needs: No Transportation Needs (10/17/2022)   PRAPARE - Administrator, Civil Service (Medical): No    Lack of Transportation (Non-Medical): No  Physical Activity: Sufficiently Active (10/17/2022)   Exercise Vital Sign    Days of Exercise per Week: 7 days    Minutes of Exercise per Session: 80 min  Stress: Stress Concern Present (10/17/2022)   [employer] of Occupational Health - Occupational Stress Questionnaire    Feeling of Stress : To some extent  Social Connections: Unknown (10/17/2022)   Social Connection and Isolation Panel [NHANES]    Frequency of Communication with Friends and Family: More than three times a week    Frequency of Social Gatherings with Friends and Family: More than three times a week    Attends Religious Services: Not on Marketing executive or Organizations: Yes    Attends Engineer, structural: More than 4 times per year    Marital Status: Divorced    Tobacco  Counseling Counseling given: Not Answered   Clinical Intake:  Pre-visit preparation completed: Yes  Pain : No/denies pain Pain Score: 0-No pain     BMI - recorded: 17.17 Nutritional Status: BMI <19  Underweight Nutritional Risks: None Diabetes: No  How often do you need to have someone help you when you read instructions, pamphlets, or other written materials from your doctor or pharmacy?: 1 - Never What is the last grade level you completed in school?: Master's Degree  Diabetic? no  Interpreter Needed?: No  Information entered by :: Anabelle Bungert N. Kylor Valverde, LPN.   Activities of Daily Living    10/17/2022    3:26 PM 10/13/2022    6:50 PM  In your present state of health, do you have any difficulty performing the following activities:  Hearing? 0 0  Vision? 0 0  Difficulty concentrating or making decisions? 0 0  Walking or climbing stairs? 0 0  Dressing or bathing? 0 0  Doing errands, shopping? 0 0  Preparing Food and eating ? N N  Using the Toilet? N N  In the past six months, have you accidently leaked urine? Y Y  Do you have problems with loss of bowel control? N N  Managing your Medications? N N  Managing your Finances? N N  Housekeeping or managing your Housekeeping? N N    Patient Care Team: Etta Grandchild, MD as PCP - General (Internal Medicine) Jodelle Red, MD as PCP - Cardiology (Cardiology) Mia Creek, MD as Consulting Physician (Ophthalmology) Sharrell Ku, MD as Consulting Physician (Gastroenterology) Cherlyn Roberts, MD as Consulting Physician (Dermatology) Valeria Batman, MD (Inactive) as Consulting Physician (Orthopedic Surgery) Lurena Nida, MD as Consulting Physician (Rheumatology) Adria Devon, DDS as Consulting Physician (Dentistry) Laurey Morale, MD as Consulting Physician (Cardiology) [facility] as Consulting Physician (Optometry)  Indicate any recent Medical Services you may have received from other  than [facility] providers in the past year (date may be approximate).     Assessment:   This is a routine wellness examination for Hala.  Hearing/Vision screen Hearing Screening - Comments:: Patient wears hearing aids. Vision Screening - Comments:: Wears rx glasses - up to date with routine eye exams with Candise Che, OD.   Dietary issues and exercise activities discussed: Current Exercise Habits: Home exercise routine, Type of exercise: walking, Time (Minutes): > 60 (80 minutes), Frequency (Times/Week): 7, Weekly Exercise (Minutes/Week): 0, Intensity: Moderate, Exercise limited by: respiratory conditions(s);orthopedic condition(s)   Goals Addressed             This Visit's Progress    My goal for 2024 is to maintain my health, stay well, stay independent , keep walking and visit my grandson.        Depression Screen    10/17/2022    3:27 PM 09/12/2021    2:47 PM 08/03/2020   11:29 PM 09/09/2019    4:27 PM 08/27/2018    2:26 PM 06/25/2017    2:57 PM 06/11/2016    1:35 PM  PHQ 2/9 Scores  PHQ - 2 Score 0 0 0 0 0 0 0  PHQ- 9 Score 0          Fall Risk    10/17/2022    3:30 PM 10/13/2022    6:50 PM 09/12/2021    2:45 PM 08/03/2020   11:29 PM 09/09/2019    4:27 PM  Fall Risk   Falls in the past year? 0 0 0 0 0  Number falls in past yr: 0  0  0  Injury with Fall? 0  0  0  Risk for fall due to : No Fall Risks  No Fall Risks No Fall Risks No Fall Risks  Follow up Falls prevention discussed  Falls evaluation completed;Falls prevention discussed Falls evaluation completed;Education provided;Falls prevention discussed Falls prevention discussed    FALL RISK PREVENTION PERTAINING TO THE HOME:  Any stairs in or around the home? No  If so, are there any without handrails?  No  Home free of loose throw rugs in walkways, pet beds, electrical cords, etc? Yes  Adequate lighting in your home to reduce risk of falls? Yes   ASSISTIVE DEVICES UTILIZED TO PREVENT FALLS:  Life alert? No  Use of  a cane, walker or w/c? No  Grab bars in the bathroom? Yes  Shower chair or bench in shower? No  Elevated toilet seat or a handicapped toilet? No   TIMED UP AND GO:  Was the test performed? Yes .  Length of time to ambulate 10 feet: 8 sec.   Gait steady and fast without use of assistive device  Cognitive Function:        10/17/2022    3:26 PM  6CIT Screen  What Year? 0 points  What month? 0 points  What time? 0 points  Count back from 20 0 points  Months in reverse 0 points  Repeat phrase 0 points  Total Score 0 points    Immunizations Immunization History  Administered Date(s) Administered   Pneumococcal Conjugate-13 02/12/2018   Pneumococcal Polysaccharide-23 02/16/2019   Td 09/12/2021   Tdap 09/18/2009    TDAP status: Up to date  Flu Vaccine status: Declined, Education has been provided regarding the importance of this vaccine but patient still declined. Advised may receive this vaccine at local pharmacy or Health Dept. Aware to provide a copy of the vaccination record if obtained from local pharmacy or Health Dept. Verbalized acceptance and understanding.  Pneumococcal vaccine status: Up to date  Covid-19 vaccine status: Declined, Education has been provided regarding the importance of this vaccine but patient still declined. Advised may receive this vaccine at local pharmacy or Health Dept.or vaccine clinic. Aware to provide a copy of the vaccination record if obtained from local pharmacy or Health Dept. Verbalized acceptance and understanding.  Qualifies for Shingles Vaccine? Yes   Zostavax completed No   Shingrix Completed?: No.    Education has been provided regarding the importance of this vaccine. Patient has been advised to call insurance company to determine out of pocket expense if they have not yet received this vaccine. Advised may also receive vaccine at local pharmacy or Health Dept. Verbalized acceptance and understanding.  Screening Tests Health  Maintenance  Topic Date Due   COVID-19 Vaccine (1) Never done   Hepatitis C Screening  Never done   Zoster Vaccines- Shingrix (1 of 2) Never done   INFLUENZA VACCINE  12/20/2022   Medicare Annual Wellness (AWV)  10/17/2023   DTaP/Tdap/Td (3 - Td or Tdap) 09/13/2031   Pneumonia Vaccine 29+ Years old  Completed   DEXA SCAN  Completed   HPV VACCINES  Aged Out    Health Maintenance  Health Maintenance Due  Topic Date Due   COVID-19 Vaccine (1) Never done   Hepatitis C Screening  Never done   Zoster Vaccines- Shingrix (1 of 2) Never done    Colorectal cancer screening: No longer required.   Mammogram status: No longer required due to history of breast cancer.  Bone Density status: Completed 11/52/2023. Results reflect: Bone density results: OSTEOPOROSIS. Repeat every 2 years.  Lung Cancer Screening: (Low Dose CT Chest recommended if Age 17-80 years, 30 pack-year currently smoking OR have quit w/in 15years.) does not qualify.   Lung Cancer Screening Referral: no  Additional Screening:  Hepatitis C Screening: does qualify; Completed: no  Vision Screening: Recommended annual ophthalmology exams for early detection of glaucoma and other disorders of the eye. Is the patient up to  date with their annual eye exam?  Yes  Who is the provider or what is the name of the office in which the patient attends annual eye exams? [facility] If pt is not established with a provider, would they like to be referred to a provider to establish care? No .   Dental Screening: Recommended annual dental exams for proper oral hygiene  Community Resource Referral / Chronic Care Management: CRR required this visit?  No   CCM required this visit?  No      Plan:     I have personally reviewed and noted the following in the patient's chart:   Medical and social history Use of alcohol, tobacco or illicit drugs  Current medications and supplements including opioid prescriptions. Patient is not  currently taking opioid prescriptions. Functional ability and status Nutritional status Physical activity Advanced directives List of other physicians Hospitalizations, surgeries, and ER visits in previous 12 months Vitals Screenings to include cognitive, depression, and falls Referrals and appointments  In addition, I have reviewed and discussed with patient certain preventive protocols, quality metrics, and best practice recommendations. A written personalized care plan for preventive services as well as general preventive health recommendations were provided to patient.     Mickeal Needy, LPN   1/61/0960   Nurse Notes: Normal cognitive status assessed by direct observation by this Nurse Health Advisor. No abnormalities found.

## 2022-10-19 LAB — PROTEIN ELECTROPHORESIS, SERUM
A/G Ratio: 1.3 (ref 0.7–1.7)
Albumin ELP: 3.5 g/dL (ref 2.9–4.4)
Alpha-1-Globulin: 0.2 g/dL (ref 0.0–0.4)
Alpha-2-Globulin: 0.6 g/dL (ref 0.4–1.0)
Beta Globulin: 0.7 g/dL (ref 0.7–1.3)
Gamma Globulin: 1.1 g/dL (ref 0.4–1.8)
Globulin, Total: 2.6 g/dL (ref 2.2–3.9)
M-Spike, %: 0.3 g/dL — ABNORMAL HIGH
Total Protein ELP: 6.1 g/dL (ref 6.0–8.5)

## 2022-10-22 ENCOUNTER — Telehealth: Payer: Self-pay

## 2022-10-22 NOTE — Telephone Encounter (Signed)
Patient gave verba understanding and had no further questions or concerns

## 2022-10-22 NOTE — Telephone Encounter (Signed)
-----   Message from Ladene Artist, MD sent at 10/21/2022  7:39 AM EDT ----- Please call patient, abnormal protein level is stable, f/u as scheduled

## 2022-11-07 ENCOUNTER — Other Ambulatory Visit (HOSPITAL_BASED_OUTPATIENT_CLINIC_OR_DEPARTMENT_OTHER): Payer: Self-pay | Admitting: Family

## 2022-11-07 NOTE — Telephone Encounter (Signed)
Rx request sent to pharmacy.  

## 2022-11-12 ENCOUNTER — Encounter (HOSPITAL_BASED_OUTPATIENT_CLINIC_OR_DEPARTMENT_OTHER): Payer: Medicare Other

## 2022-11-13 ENCOUNTER — Encounter (HOSPITAL_BASED_OUTPATIENT_CLINIC_OR_DEPARTMENT_OTHER): Payer: Medicare Other

## 2022-11-14 ENCOUNTER — Ambulatory Visit: Payer: Medicare Other | Admitting: Nurse Practitioner

## 2022-11-16 ENCOUNTER — Ambulatory Visit (INDEPENDENT_AMBULATORY_CARE_PROVIDER_SITE_OTHER): Payer: Medicare Other

## 2022-11-16 DIAGNOSIS — M810 Age-related osteoporosis without current pathological fracture: Secondary | ICD-10-CM

## 2022-11-16 MED ORDER — ROMOSOZUMAB-AQQG 105 MG/1.17ML ~~LOC~~ SOSY
210.0000 mg | PREFILLED_SYRINGE | Freq: Once | SUBCUTANEOUS | Status: AC
Start: 2022-11-16 — End: 2022-11-16
  Administered 2022-11-16: 210 mg via SUBCUTANEOUS

## 2022-11-16 NOTE — Progress Notes (Signed)
Pt received both injection in her left and right thighs pt responded well to it

## 2022-12-05 ENCOUNTER — Telehealth (HOSPITAL_BASED_OUTPATIENT_CLINIC_OR_DEPARTMENT_OTHER): Payer: Self-pay | Admitting: *Deleted

## 2022-12-05 DIAGNOSIS — I4819 Other persistent atrial fibrillation: Secondary | ICD-10-CM

## 2022-12-05 MED ORDER — APIXABAN 5 MG PO TABS
5.0000 mg | ORAL_TABLET | Freq: Two times a day (BID) | ORAL | 1 refills | Status: DC
Start: 2022-12-05 — End: 2023-06-05

## 2022-12-05 NOTE — Telephone Encounter (Signed)
Rx(s) sent to pharmacy electronically.  

## 2022-12-13 ENCOUNTER — Ambulatory Visit (INDEPENDENT_AMBULATORY_CARE_PROVIDER_SITE_OTHER): Payer: Medicare Other | Admitting: Radiology

## 2022-12-13 DIAGNOSIS — M810 Age-related osteoporosis without current pathological fracture: Secondary | ICD-10-CM | POA: Diagnosis not present

## 2022-12-13 MED ORDER — ROMOSOZUMAB-AQQG 105 MG/1.17ML ~~LOC~~ SOSY
210.0000 mg | PREFILLED_SYRINGE | Freq: Once | SUBCUTANEOUS | Status: AC
Start: 2022-12-13 — End: 2022-12-13
  Administered 2022-12-13: 210 mg via SUBCUTANEOUS

## 2022-12-13 NOTE — Progress Notes (Signed)
Patient is here to receive Evenity Subcutaneous shots. Pt tolerated well with no complications in both thigh injections.

## 2023-01-17 ENCOUNTER — Ambulatory Visit (INDEPENDENT_AMBULATORY_CARE_PROVIDER_SITE_OTHER): Payer: Medicare Other | Admitting: *Deleted

## 2023-01-17 DIAGNOSIS — M810 Age-related osteoporosis without current pathological fracture: Secondary | ICD-10-CM | POA: Diagnosis not present

## 2023-01-17 MED ORDER — ROMOSOZUMAB-AQQG 105 MG/1.17ML ~~LOC~~ SOSY
210.0000 mg | PREFILLED_SYRINGE | Freq: Once | SUBCUTANEOUS | Status: AC
Start: 2023-01-17 — End: 2023-01-17
  Administered 2023-01-17: 210 mg via SUBCUTANEOUS

## 2023-01-17 NOTE — Progress Notes (Signed)
Patient is here for her Evenity shot, given in bilateral thigh area. Patient tolerated well.

## 2023-01-30 ENCOUNTER — Encounter: Payer: Medicare Other | Admitting: Nurse Practitioner

## 2023-02-18 ENCOUNTER — Ambulatory Visit: Payer: Medicare Other

## 2023-02-18 DIAGNOSIS — M810 Age-related osteoporosis without current pathological fracture: Secondary | ICD-10-CM

## 2023-02-18 MED ORDER — ROMOSOZUMAB-AQQG 105 MG/1.17ML ~~LOC~~ SOSY
210.0000 mg | PREFILLED_SYRINGE | Freq: Once | SUBCUTANEOUS | Status: AC
Start: 2023-02-18 — End: 2023-02-18
  Administered 2023-02-18: 210 mg via SUBCUTANEOUS

## 2023-02-18 NOTE — Progress Notes (Signed)
After obtaining consent, and per orders of Dr. Jones, injection of Evenity given by Regla Fitzgibbon P Correy Weidner. Patient instructed to report any adverse reaction to me immediately.  

## 2023-03-07 ENCOUNTER — Encounter (HOSPITAL_BASED_OUTPATIENT_CLINIC_OR_DEPARTMENT_OTHER): Payer: Self-pay | Admitting: Cardiology

## 2023-03-07 ENCOUNTER — Ambulatory Visit (HOSPITAL_BASED_OUTPATIENT_CLINIC_OR_DEPARTMENT_OTHER): Payer: Medicare Other | Admitting: Cardiology

## 2023-03-07 VITALS — BP 116/74 | HR 67 | Ht 62.5 in | Wt 97.2 lb

## 2023-03-07 DIAGNOSIS — Z952 Presence of prosthetic heart valve: Secondary | ICD-10-CM | POA: Diagnosis not present

## 2023-03-07 DIAGNOSIS — Z7901 Long term (current) use of anticoagulants: Secondary | ICD-10-CM | POA: Diagnosis not present

## 2023-03-07 DIAGNOSIS — I340 Nonrheumatic mitral (valve) insufficiency: Secondary | ICD-10-CM

## 2023-03-07 DIAGNOSIS — I480 Paroxysmal atrial fibrillation: Secondary | ICD-10-CM

## 2023-03-07 DIAGNOSIS — R911 Solitary pulmonary nodule: Secondary | ICD-10-CM

## 2023-03-07 NOTE — Patient Instructions (Signed)
Medication Instructions:  Your physician recommends that you continue on your current medications as directed. Please refer to the Current Medication list given to you today.  Follow-Up: At [facility], you and your health needs are our priority.  As part of our continuing mission to provide you with exceptional heart care, we have created designated Provider Care Teams.  These Care Teams include your primary Cardiologist (physician) and Advanced Practice Providers (APPs -  Physician Assistants and Nurse Practitioners) who all work together to provide you with the care you need, when you need it.  We recommend signing up for the patient portal called "MyChart".  Sign up information is provided on this After Visit Summary.  MyChart is used to connect with patients for Virtual Visits (Telemedicine).  Patients are able to view lab/test results, encounter notes, upcoming appointments, etc.  Non-urgent messages can be sent to your provider as well.   To learn more about what you can do with MyChart, go to [email or web address].    Your next appointment:   9 months with Dr. Cristal Deer

## 2023-03-07 NOTE — Progress Notes (Signed)
Cardiology Office Note:  .    Date:  03/07/2023  ID:  Helen Davis, DOB 01/15/1941, MRN [id number] PCP: Etta Grandchild, MD  [facility] Providers Cardiologist:  Jodelle Red, MD     History of Present Illness: .    Helen Davis is a 82 y.o. female with a hx of mitral regurgitation s/p bioprosthetic MVR at [facility] 05/2021, remote breast cancer who is seen for follow up today. I initially met her 02/15/20 as a new consult at the request of Lucky Cowboy, MD for the evaluation and management of mitral regurgitation.   Pertinent history: -mitral valve replacement (31mm Carpentier-Edwards Mitris bioprosthetic valve) with Dr. Zebedee Iba at [facility] 05/29/21. Had heartport incision, healing well. Found to be in new afib at her follow up visit. -CT imaging at [facility] noted "A 2.4 x 1.6 cm subsolid spiculated nodule in the anterior right upper lobe. No tracheobronchial tree abnormalities. No pleural effusion."  -bilateral mastectomy for breast cancer in [place] in 1999, had lumpectomy and 4 mos of chemo initially but then had bilateral mastectomy when another mass found, underwent additional chemo. Remotely seen by Dr. Darnelle Catalan, has been ~20 years since follow up.   In 11/2021, she was doing well. We discussed postop afib at length. No symptoms since stopping amiodarone. 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 Both her mother and sister have had strokes. She does bruise easily but felt this was manageable. After shared decision making, we continued anticoagulation.  Today, she states she is generally feeling well and that she wouldn't know if she was in Afib. She remains complaint with Eliquis, metoprolol, and ASA.  For exercise she goes on walks regularly up to 2.5 miles. She notes this isn't as far as she used to be able to walk. However, she denies any anginal symptoms or shortness of breath.  In the interim she reports having some  episodes of shortness of breath and near syncope. She believes her symptoms were related to anxiety at the time. She sought evaluation from her pulmonologist 09/2022. Since then these episodes have not recurred.  She denies any palpitations, chest pain, peripheral edema, lightheadedness, headaches, orthopnea, or PND.  ROS:  Please see the history of present illness. ROS otherwise negative except as noted.   Studies Reviewed: Marland Kitchen    EKG Interpretation Date/Time:  Thursday March 07 2023 15:49:53 EDT Ventricular Rate:  65 PR Interval:  166 QRS Duration:  86 QT Interval:  410 QTC Calculation: 426 R Axis:   76  Text Interpretation: Normal sinus rhythm Left ventricular hypertrophy with repolarization abnormality Confirmed by Jodelle Red [phone]) on 03/07/2023 4:06:09 PM    Chest X-Ray  10/05/2022: IMPRESSION: 1. No acute intrathoracic process. 2. Stable hyperinflation. 3. The spiculated ground-glass nodule seen within the right upper lobe on prior CT is not well visualized by x-ray. Please refer to prior CT report 06/04/2022 for follow-up recommendations.   Physical Exam:    VS:  BP 116/74   Pulse 67   Ht 5' 2.5" (1.588 m)   Wt 97 lb 3.2 oz (44.1 kg)   LMP  (LMP Unknown)   SpO2 90%   BMI 17.49 kg/m    Wt Readings from Last 3 Encounters:  03/07/23 97 lb 3.2 oz (44.1 kg)  10/17/22 98 lb (44.5 kg)  10/16/22 96 lb 14.4 oz (44 kg)    GEN: Well nourished, well developed in no acute distress HEENT: Normal, moist mucous membranes  NECK: No JVD CARDIAC: regular rhythm, normal S1 and S2, no rubs or gallops. No murmur. VASCULAR: Radial and DP pulses 2+ bilaterally. No carotid bruits RESPIRATORY:  Clear to auscultation without rales, wheezing or rhonchi  ABDOMEN: Soft, non-tender, non-distended MUSCULOSKELETAL:  Ambulates independently SKIN: Warm and dry, no edema NEUROLOGIC:  Alert and oriented x 3. No focal neuro deficits noted. PSYCHIATRIC:  Normal affect   ASSESSMENT  AND PLAN: .    Severe mitral regurgitation S/P Bioprosthetic MVR (31mm Carpentier-Edwards Mitris) by Dr. Zebedee Iba at [facility] 05/29/21 Postoperative atrial fibrillation with RVR -CHA2DS2/VAS Stroke Risk Points= 4  -continue apixaban, see shared decision making below. -has not had any palpitations off of amiodarone. 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 Both her mother and sister have had strokes. She does bruise easily but feels this is manageable. After shared decision making, will continue anticoagulation. -aspirin 81 mg chronically due to valve -prophylactic antibiotics before dental cleanings. -continue low dose metoprolol   Pulmonary nodule found on CT scan History of breast cancer s/p bilateral mastectomy and chemo -concern with spiculated nodule seen on CT. Follows with Dr. Delton Coombes.   Cardiac risk counseling and prevention recommendations: -recommend heart healthy/Mediterranean diet, with whole grains, fruits, vegetable, fish, lean meats, nuts, and olive oil. Limit salt. -recommend moderate walking, 3-5 times/week for 30-50 minutes each session. Aim for at least 150 minutes.week. Goal should be pace of 3 miles/hours, or walking 1.5 miles in 30 minutes -recommend avoidance of tobacco products. Avoid excess alcohol.   Dispo: Follow-up in 9 months, or sooner as needed.  I,Mathew Stumpf,acting as a Neurosurgeon for [employer], MD.,have documented all relevant documentation on the behalf of Jodelle Red, MD,as directed by  Jodelle Red, MD while in the presence of Jodelle Red, MD.  I, Jodelle Red, MD, have reviewed all documentation for this visit. The documentation on 03/07/23 for the exam, diagnosis, procedures, and orders are all accurate and complete.   Signed, Jodelle Red, MD

## 2023-04-02 ENCOUNTER — Inpatient Hospital Stay: Payer: Medicare Other | Admitting: Oncology

## 2023-04-02 ENCOUNTER — Inpatient Hospital Stay: Payer: Medicare Other | Attending: Oncology

## 2023-04-02 VITALS — BP 117/74 | HR 70 | Temp 98.1°F | Resp 18 | Ht 62.5 in | Wt 96.9 lb

## 2023-04-02 DIAGNOSIS — D473 Essential (hemorrhagic) thrombocythemia: Secondary | ICD-10-CM | POA: Insufficient documentation

## 2023-04-02 DIAGNOSIS — D472 Monoclonal gammopathy: Secondary | ICD-10-CM

## 2023-04-02 DIAGNOSIS — R0609 Other forms of dyspnea: Secondary | ICD-10-CM

## 2023-04-02 LAB — CBC WITH DIFFERENTIAL (CANCER CENTER ONLY)
Abs Immature Granulocytes: 0.02 10*3/uL (ref 0.00–0.07)
Basophils Absolute: 0.1 10*3/uL (ref 0.0–0.1)
Basophils Relative: 1 %
Eosinophils Absolute: 0.1 10*3/uL (ref 0.0–0.5)
Eosinophils Relative: 1 %
HCT: 36.1 % (ref 36.0–46.0)
Hemoglobin: 12.2 g/dL (ref 12.0–15.0)
Immature Granulocytes: 0 %
Lymphocytes Relative: 21 %
Lymphs Abs: 1.5 10*3/uL (ref 0.7–4.0)
MCH: 32.2 pg (ref 26.0–34.0)
MCHC: 33.8 g/dL (ref 30.0–36.0)
MCV: 95.3 fL (ref 80.0–100.0)
Monocytes Absolute: 0.9 10*3/uL (ref 0.1–1.0)
Monocytes Relative: 12 %
Neutro Abs: 4.6 10*3/uL (ref 1.7–7.7)
Neutrophils Relative %: 65 %
Platelet Count: 256 10*3/uL (ref 150–400)
RBC: 3.79 MIL/uL — ABNORMAL LOW (ref 3.87–5.11)
RDW: 13.1 % (ref 11.5–15.5)
WBC Count: 7.2 10*3/uL (ref 4.0–10.5)
nRBC: 0 % (ref 0.0–0.2)

## 2023-04-02 LAB — CMP (CANCER CENTER ONLY)
ALT: 18 U/L (ref 0–44)
AST: 23 U/L (ref 15–41)
Albumin: 4 g/dL (ref 3.5–5.0)
Alkaline Phosphatase: 65 U/L (ref 38–126)
Anion gap: 8 (ref 5–15)
BUN: 10 mg/dL (ref 8–23)
CO2: 27 mmol/L (ref 22–32)
Calcium: 9.2 mg/dL (ref 8.9–10.3)
Chloride: 95 mmol/L — ABNORMAL LOW (ref 98–111)
Creatinine: 0.66 mg/dL (ref 0.44–1.00)
GFR, Estimated: >60 mL/min (ref >60)
Glucose, Bld: 89 mg/dL (ref 70–99)
Potassium: 4 mmol/L (ref 3.5–5.1)
Sodium: 130 mmol/L — ABNORMAL LOW (ref 135–145)
Total Bilirubin: 0.7 mg/dL (ref <1.2)
Total Protein: 7.1 g/dL (ref 6.5–8.1)

## 2023-04-02 NOTE — Progress Notes (Signed)
  [facility] OFFICE PROGRESS NOTE   Diagnosis: Monoclonal protein  INTERVAL HISTORY:   Helen Davis returns as scheduled.  She feels well.  No fever or night sweats.  A varicose vein "bust" when she was in the lobby today and a bandage was placed by the nursing staff.  The bleeding stopped.  She continues anticoagulation therapy.  Objective:  Vital signs in last 24 hours:  Blood pressure 117/74, pulse 70, temperature 98.1 F (36.7 C), temperature source Temporal, resp. rate 18, height 5' 2.5" (1.588 m), weight 96 lb 14.4 oz (44 kg), SpO2 100%.   Lymphatics: No cervical or supraclavicular nodes.  "Shotty "bilateral axillary and inguinal nodes Resp: Lungs clear bilaterally Cardio: Regular rate and rhythm GI: No hepatosplenomegaly Vascular: No leg edema, small varicosities near the left ankle, bandage in place without active bleeding   Lab Results:  Lab Results  Component Value Date   WBC 7.2 04/02/2023   HGB 12.2 04/02/2023   HCT 36.1 04/02/2023   MCV 95.3 04/02/2023   PLT 256 04/02/2023   NEUTROABS 4.6 04/02/2023    CMP  Lab Results  Component Value Date   NA 130 (L) 10/16/2022   K 4.0 10/16/2022   CL 98 10/16/2022   CO2 25 10/16/2022   GLUCOSE 91 10/16/2022   BUN 11 10/16/2022   CREATININE 0.67 10/16/2022   CALCIUM 8.9 10/16/2022   PROT 6.4 (L) 10/16/2022   ALBUMIN 3.9 10/16/2022   AST 17 10/16/2022   ALT 12 10/16/2022   ALKPHOS 90 10/16/2022   BILITOT 0.6 10/16/2022   GFRNONAA >60 10/16/2022   GFRAA 99 08/04/2020   I reviewed the patient's current medications.   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  She is stable from a hematologic standpoint.  We will follow-up on the myeloma panel from today.  She will return for an office and lab visit in 8 months.  Thornton Papas, MD  04/02/2023  3:07 PM

## 2023-04-03 ENCOUNTER — Encounter: Payer: Self-pay | Admitting: Internal Medicine

## 2023-04-03 ENCOUNTER — Ambulatory Visit: Payer: Medicare Other | Admitting: Internal Medicine

## 2023-04-03 VITALS — BP 114/64 | HR 65 | Temp 98.1°F | Ht 62.5 in | Wt 96.0 lb

## 2023-04-03 DIAGNOSIS — M810 Age-related osteoporosis without current pathological fracture: Secondary | ICD-10-CM

## 2023-04-03 DIAGNOSIS — E782 Mixed hyperlipidemia: Secondary | ICD-10-CM

## 2023-04-03 DIAGNOSIS — I480 Paroxysmal atrial fibrillation: Secondary | ICD-10-CM | POA: Diagnosis not present

## 2023-04-03 DIAGNOSIS — E559 Vitamin D deficiency, unspecified: Secondary | ICD-10-CM | POA: Diagnosis not present

## 2023-04-03 DIAGNOSIS — Z0001 Encounter for general adult medical examination with abnormal findings: Secondary | ICD-10-CM

## 2023-04-03 DIAGNOSIS — Z0000 Encounter for general adult medical examination without abnormal findings: Secondary | ICD-10-CM

## 2023-04-03 LAB — KAPPA/LAMBDA LIGHT CHAINS
Kappa free light chain: 42.2 mg/L — ABNORMAL HIGH (ref 3.3–19.4)
Kappa, lambda light chain ratio: 2.56 — ABNORMAL HIGH (ref 0.26–1.65)
Lambda free light chains: 16.5 mg/L (ref 5.7–26.3)

## 2023-04-03 MED ORDER — ROMOSOZUMAB-AQQG 105 MG/1.17ML ~~LOC~~ SOSY
210.0000 mg | PREFILLED_SYRINGE | Freq: Once | SUBCUTANEOUS | Status: DC
Start: 2023-05-03 — End: 2023-09-04

## 2023-04-03 MED ORDER — ROMOSOZUMAB-AQQG 105 MG/1.17ML ~~LOC~~ SOSY
210.0000 mg | PREFILLED_SYRINGE | Freq: Once | SUBCUTANEOUS | Status: AC
Start: 2023-04-03 — End: 2023-04-03
  Administered 2023-04-03: 210 mg via SUBCUTANEOUS

## 2023-04-03 NOTE — Progress Notes (Signed)
Subjective:  Patient ID: Helen Davis, female    DOB: May 13, 1941  Age: 82 y.o. MRN: [id number]  CC: Annual Exam and Hyperlipidemia   HPI Helen Davis presents for a CPX and f/up -----  Discussed the use of AI scribe software for clinical note transcription with the patient, who gave verbal consent to proceed.  History of Present Illness   The patient, with a history of mitral valve replacement and atrial fibrillation, reports no significant symptoms such as palpitations, chest pain, or shortness of breath. She maintains an active lifestyle, walking two and a half miles daily on hilly terrain. She denies experiencing any dizzy spells or lightheadedness.  The patient is currently on Eliquis, metoprolol, and a baby aspirin, a regimen initiated after her valve replacement due to a diagnosis of atrial fibrillation. She expresses concern about easy bruising and bleeding, including an episode of significant bleeding from ankle veins.  In addition, the patient is under the care of a rheumatologist for rheumatoid arthritis and is being monitored for potential development of multiple myeloma or lymphoma. She denies symptoms such as night sweats, swollen lymph nodes, or unexplained weight loss. However, she notes a decrease in weight, attributing it to personal preference for dinner. She also reports a significant bruise following recent lab work.       Outpatient Medications Prior to Visit  Medication Sig Dispense Refill   Acetaminophen (TYLENOL EXTRA STRENGTH PO) Take by mouth.     amoxicillin (AMOXIL) 500 MG tablet Take 4 tablets 1 hour prior to dental work. 4 tablet 1   apixaban (ELIQUIS) 5 MG TABS tablet Take 1 tablet (5 mg total) by mouth 2 (two) times daily. 180 tablet 1   Bioflavonoid Products (ESTER-C) 500-550 MG TABS Take 500-1,000 mg by mouth daily.     Cholecalciferol (VITAMIN D PO) Take 4,000 Int'l Units by mouth daily.     Cyanocobalamin (VITAMIN B 12 PO) Take 1,000 mcg by  mouth daily. Timed release     MAGNESIUM PO Take 500 mg by mouth at bedtime.     metoprolol tartrate (LOPRESSOR) 25 MG tablet TAKE 0.5 TABLETS BY MOUTH TWO TIMES A DAY 90 tablet 1   Multiple Vitamins-Minerals (CENTRUM SILVER PO) Take 1 tablet by mouth daily. With iron     Zinc 30 MG TABS Take 30 mg by mouth daily. PRN     albuterol (VENTOLIN HFA) 108 (90 Base) MCG/ACT inhaler Inhale 2 puffs into the lungs every 6 (six) hours as needed for wheezing or shortness of breath. (Patient not taking: Reported on 04/02/2023) 8 g 2   No facility-administered medications prior to visit.    ROS Review of Systems  Constitutional:  Negative for appetite change, chills, diaphoresis and fatigue.  HENT: Negative.    Eyes: Negative.   Respiratory: Negative.  Negative for cough, chest tightness, shortness of breath and wheezing.   Cardiovascular:  Negative for chest pain, palpitations and leg swelling.  Gastrointestinal:  Negative for abdominal pain, constipation, diarrhea, nausea and vomiting.  Genitourinary: Negative.  Negative for difficulty urinating.  Musculoskeletal:  Positive for arthralgias, back pain and gait problem.  Skin: Negative.   Neurological:  Negative for dizziness and weakness.  Hematological:  Negative for adenopathy. Bruises/bleeds easily.  Psychiatric/Behavioral: Negative.      Objective:  BP 114/64 (BP Location: Left Arm, Patient Position: Sitting, Cuff Size: Small)   Pulse 65   Temp 98.1 F (36.7 C) (Oral)   Ht 5' 2.5" (1.588 m)   [employer]  96 lb (43.5 kg)   LMP  (LMP Unknown)   SpO2 97%   BMI 17.28 kg/m   BP Readings from Last 3 Encounters:  04/03/23 114/64  04/02/23 117/74  03/07/23 116/74    Wt Readings from Last 3 Encounters:  04/03/23 96 lb (43.5 kg)  04/02/23 96 lb 14.4 oz (44 kg)  03/07/23 97 lb 3.2 oz (44.1 kg)    Physical Exam Vitals reviewed.  Constitutional:      Appearance: Normal appearance.  HENT:     Nose: Nose normal.  Eyes:     General: No  scleral icterus.    Pupils: Pupils are equal, round, and reactive to light.  Cardiovascular:     Rate and Rhythm: Normal rate and regular rhythm.     Heart sounds: Murmur heard.     Systolic murmur is present with a grade of 1/6.     No friction rub. No gallop.     Comments: SEM 1/6 RUSB Pulmonary:     Breath sounds: No stridor. No wheezing or rhonchi.  Abdominal:     General: Abdomen is flat.     Palpations: There is no mass.     Tenderness: There is no abdominal tenderness. There is no guarding.     Hernia: No hernia is present.  Musculoskeletal:        General: Normal range of motion.     Cervical back: Neck supple.     Right lower leg: No edema.     Left lower leg: No edema.  Skin:    General: Skin is dry.     Findings: Bruising and ecchymosis present. No erythema or petechiae.  Neurological:     General: No focal deficit present.     Mental Status: She is alert. Mental status is at baseline.  Psychiatric:        Mood and Affect: Mood normal.        Behavior: Behavior normal.     Lab Results  Component Value Date   WBC 7.2 04/02/2023   HGB 12.2 04/02/2023   HCT 36.1 04/02/2023   PLT 256 04/02/2023   GLUCOSE 89 04/02/2023   CHOL 194 01/29/2022   TRIG 97 01/29/2022   HDL 92 01/29/2022   LDLCALC 83 01/29/2022   ALT 18 04/02/2023   AST 23 04/02/2023   NA 130 (L) 04/02/2023   K 4.0 04/02/2023   CL 95 (L) 04/02/2023   CREATININE 0.66 04/02/2023   BUN 10 04/02/2023   CO2 27 04/02/2023   TSH 0.67 01/29/2022   HGBA1C 5.1 01/29/2022   MICROALBUR <0.2 01/29/2022    CT Chest Wo Contrast  Result Date: 06/04/2022 CLINICAL DATA:  Lung nodule.  History of breast cancer EXAM: CT CHEST WITHOUT CONTRAST TECHNIQUE: Multidetector CT imaging of the chest was performed following the standard protocol without IV contrast. RADIATION DOSE REDUCTION: This exam was performed according to the departmental dose-optimization program which includes automated exposure control, adjustment  of the mA and/or kV according to patient size and/or use of iterative reconstruction technique. COMPARISON:  11/29/2021 FINDINGS: Cardiovascular: Prosthetic mitral valve. Heart is nonenlarged. No significant pericardial effusion. On this non IV contrast exam there is minimal atherosclerotic calcified plaque along the thoracic aorta. Mediastinum/Nodes: On this non IV contrast exam no specific abnormal lymph node enlargement is seen in the axillary region, hilum or mediastinum. There are surgical clips in the right axilla from presumed axillary lymph node dissection. The thoracic aorta has a normal course and caliber. Small  hiatal hernia. Lungs/Pleura: Lung windows are without consolidation, pneumothorax or effusion. There is an ill-defined area of ground-glass with some bandlike changes once again seen in the inferior anteromedial aspect of the right upper lobe. There is central cavitary like area as well. The overall opacity is similar in appearance to previous examination. Prior dimensions in the axial plane measured at 2.4 x 1.9 cm and today 2.5 by 1.6 cm, essentially unchanged when adjusted for slice misregistration. There is also some middle lobe bronchiectasis with some opacity along the bronchus as seen on series 7, image 90 measuring 7 by 3 mm, unchanged as well. No new dominant mass lesion. Upper Abdomen: Grossly the adrenal glands are preserved. Benign-appearing hepatic cysts. The stomach is collapsed. Prominent colonic stool Musculoskeletal: Scattered degenerative changes of the spine. There is some trace retrolisthesis noted of T12 on L1. old rib fractures are seen. Once again there is some low-density areas noted along some dilated neural foramen lower thoracic spine. Again possibilities include dilated nerve root sheaths or perineural cysts IMPRESSION: Overall no significant interval change. Spiculated ground-glass nodular lesion once again seen in the anterior central right upper lobe. Stable in size  and appearance. Also stable smaller middle lobe nodule in area of presumed bronchiectasis. Adenocarcinoma cannot be excluded. Follow up by CT is recommended in 6-12 months, with continued annual surveillance for a minimum of 3 years. These recommendations are taken from: Recommendations for the Management of Subsolid Pulmonary Nodules Detected at CT: A Statement from the Fleischner Society Radiology 2013; 266:1, [phone]. Persistent paraspinal multilevel low-density areas lung neural foramen. Although this has a differential these could represent perineural cysts. Please correlate with any prior workup or dedicated MRI when appropriate Aortic Atherosclerosis (ICD10-I70.0). Electronically Signed   By: Karen Kays M.D.   On: 06/04/2022 12:27    Assessment & Plan:   PAF (paroxysmal atrial fibrillation) (HCC)- She has good rate/rhythm control. -     TSH; Future  Osteoporosis, unspecified osteoporosis type, unspecified pathological fracture presence -     VITAMIN D 25 Hydroxy (Vit-D Deficiency, Fractures); Future -     Phosphorus; Future -     Romosozumab-aqqg -     Romosozumab-aqqg  Mixed hyperlipidemia -     Lipid panel; Future  Vitamin D deficiency- Will monitor her vitamin D level.  Encounter for general adult medical examination with abnormal findings - Exam completed, labs ordered, vaccines reviewed, no cancer screenings indicated, pt ed material was given.      Follow-up: Return in about 6 months (around 10/01/2023).  Sanda Linger, MD

## 2023-04-03 NOTE — Patient Instructions (Signed)

## 2023-04-04 DIAGNOSIS — K080 Exfoliation of teeth due to systemic causes: Secondary | ICD-10-CM | POA: Diagnosis not present

## 2023-04-05 DIAGNOSIS — Z0001 Encounter for general adult medical examination with abnormal findings: Secondary | ICD-10-CM | POA: Insufficient documentation

## 2023-04-07 LAB — MULTIPLE MYELOMA PANEL, SERUM
Albumin SerPl Elph-Mcnc: 4 g/dL (ref 2.9–4.4)
Albumin/Glob SerPl: 1.7 (ref 0.7–1.7)
Alpha 1: 0.2 g/dL (ref 0.0–0.4)
Alpha2 Glob SerPl Elph-Mcnc: 0.5 g/dL (ref 0.4–1.0)
B-Globulin SerPl Elph-Mcnc: 0.7 g/dL (ref 0.7–1.3)
Gamma Glob SerPl Elph-Mcnc: 1.1 g/dL (ref 0.4–1.8)
Globulin, Total: 2.5 g/dL (ref 2.2–3.9)
IgA: 25 mg/dL — ABNORMAL LOW (ref 64–422)
IgG (Immunoglobin G), Serum: 436 mg/dL — ABNORMAL LOW (ref 586–1602)
IgM (Immunoglobulin M), Srm: 1170 mg/dL — ABNORMAL HIGH (ref 26–217)
M Protein SerPl Elph-Mcnc: 0.5 g/dL — ABNORMAL HIGH
Total Protein ELP: 6.5 g/dL (ref 6.0–8.5)

## 2023-04-08 ENCOUNTER — Encounter (HOSPITAL_BASED_OUTPATIENT_CLINIC_OR_DEPARTMENT_OTHER): Payer: Self-pay

## 2023-04-10 ENCOUNTER — Telehealth: Payer: Self-pay | Admitting: *Deleted

## 2023-04-10 NOTE — Telephone Encounter (Signed)
Called patient w/stable IgM and monoclonal protein results. She will f/u in July as scheduled.

## 2023-04-10 NOTE — Telephone Encounter (Signed)
-----   Message from Thornton Papas sent at 04/09/2023  7:07 PM EST ----- Please call patient, Helen Davis and monoclonal protein levels are stable, f/u as scheduled

## 2023-04-15 ENCOUNTER — Other Ambulatory Visit: Payer: Self-pay | Admitting: Internal Medicine

## 2023-04-15 ENCOUNTER — Other Ambulatory Visit (INDEPENDENT_AMBULATORY_CARE_PROVIDER_SITE_OTHER): Payer: Medicare Other

## 2023-04-15 ENCOUNTER — Encounter: Payer: Self-pay | Admitting: Internal Medicine

## 2023-04-15 DIAGNOSIS — E782 Mixed hyperlipidemia: Secondary | ICD-10-CM

## 2023-04-15 DIAGNOSIS — I480 Paroxysmal atrial fibrillation: Secondary | ICD-10-CM

## 2023-04-15 DIAGNOSIS — M810 Age-related osteoporosis without current pathological fracture: Secondary | ICD-10-CM | POA: Diagnosis not present

## 2023-04-15 LAB — LIPID PANEL
Cholesterol: 177 mg/dL (ref 0–200)
HDL: 87.9 mg/dL (ref >39.00)
LDL Cholesterol: 65 mg/dL (ref 0–99)
NonHDL: 89.39
Total CHOL/HDL Ratio: 2
Triglycerides: 122 mg/dL (ref 0.0–149.0)
VLDL: 24.4 mg/dL (ref 0.0–40.0)

## 2023-04-15 LAB — VITAMIN D 25 HYDROXY (VIT D DEFICIENCY, FRACTURES): VITD: 89.87 ng/mL (ref 30.00–100.00)

## 2023-04-15 LAB — PHOSPHORUS: Phosphorus: 3.1 mg/dL (ref 2.3–4.6)

## 2023-04-15 LAB — TSH: TSH: 1.46 u[IU]/mL (ref 0.35–5.50)

## 2023-04-24 ENCOUNTER — Telehealth: Payer: Self-pay | Admitting: Emergency Medicine

## 2023-04-24 NOTE — Telephone Encounter (Signed)
Helen Davis called and cancelled the appt order documented on

## 2023-04-24 NOTE — Telephone Encounter (Signed)
Patient would like to cancel CT scan scheduled 06/05/2023. Patient will call back to reschedule. Patient phone number is [phone].

## 2023-05-02 DIAGNOSIS — K080 Exfoliation of teeth due to systemic causes: Secondary | ICD-10-CM | POA: Diagnosis not present

## 2023-05-03 ENCOUNTER — Other Ambulatory Visit (HOSPITAL_BASED_OUTPATIENT_CLINIC_OR_DEPARTMENT_OTHER): Payer: Self-pay | Admitting: Family

## 2023-05-17 ENCOUNTER — Ambulatory Visit: Payer: Medicare Other

## 2023-05-17 ENCOUNTER — Telehealth: Payer: Self-pay

## 2023-05-17 DIAGNOSIS — M810 Age-related osteoporosis without current pathological fracture: Secondary | ICD-10-CM

## 2023-05-17 MED ORDER — ROMOSOZUMAB-AQQG 105 MG/1.17ML ~~LOC~~ SOSY
210.0000 mg | PREFILLED_SYRINGE | Freq: Once | SUBCUTANEOUS | Status: AC
  Administered 2023-05-17: 210 mg via SUBCUTANEOUS

## 2023-05-17 NOTE — Progress Notes (Signed)
Pt received her evenity shot today and responded well in both her left and right thigh's pt preference.

## 2023-05-17 NOTE — Telephone Encounter (Signed)
Prolia VOB initiated via [email or web address] 

## 2023-05-21 ENCOUNTER — Other Ambulatory Visit: Payer: Self-pay | Admitting: *Deleted

## 2023-05-21 DIAGNOSIS — L97919 Non-pressure chronic ulcer of unspecified part of right lower leg with unspecified severity: Secondary | ICD-10-CM

## 2023-05-23 ENCOUNTER — Ambulatory Visit: Payer: Medicare Other | Admitting: Physician Assistant

## 2023-05-23 ENCOUNTER — Encounter: Payer: Self-pay | Admitting: Physician Assistant

## 2023-05-23 ENCOUNTER — Ambulatory Visit (HOSPITAL_COMMUNITY)
Admission: RE | Admit: 2023-05-23 | Discharge: 2023-05-23 | Disposition: A | Discharge status: Home / Self Care | Payer: Medicare Other | Source: Ambulatory Visit | Attending: Vascular Surgery | Admitting: Vascular Surgery

## 2023-05-23 VITALS — BP 147/76 | HR 65 | Temp 98.2°F | Resp 18 | Ht 62.5 in | Wt 96.4 lb

## 2023-05-23 DIAGNOSIS — I83899 Varicose veins of unspecified lower extremities with other complications: Secondary | ICD-10-CM

## 2023-05-23 DIAGNOSIS — I83019 Varicose veins of right lower extremity with ulcer of unspecified site: Secondary | ICD-10-CM | POA: Diagnosis not present

## 2023-05-23 DIAGNOSIS — I872 Venous insufficiency (chronic) (peripheral): Secondary | ICD-10-CM | POA: Diagnosis not present

## 2023-05-23 DIAGNOSIS — L97919 Non-pressure chronic ulcer of unspecified part of right lower leg with unspecified severity: Secondary | ICD-10-CM | POA: Insufficient documentation

## 2023-05-23 NOTE — Progress Notes (Signed)
 Requested by:  Joshua Debby CROME, MD [address] [place],  [place] [address]  Reason for consultation: bleeding varicose vein    History of Present Illness   Helen Davis is a 83 y.o. (10-28-1940) female who presents for evaluation of a ruptured varicose vein. She states about 5 days ago she had a varicose vein around her right inner ankle spontaneously bleed while in the shower. She immediately had to turn off the shower and hold pressure on her ankle until it stopped bleeding, which took about 20 minutes. She has not had any bleeding events since then. A scab has formed over her ruptured varicose vein site, and she has kept a clean bandage on this area.  Outside of this event, she does endorse continued issues with bilateral lower extremity swelling. This is moderate in nature. She tries to control her symptoms with compression stockings and leg elevation. 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  She has previously been seen by Dr.Fields in 2020 after her first ruptured varicosity on the right ankle. She was found to have diffuse reflux in her right small saphenous vein. She was considered a possible candidate for future small saphenous ablation, however she was lost to follow up.   She has no prior history of DVT. She does have a history of right GSV stripping.    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/29/2021   RIGHT/LEFT HEART CATH AND CORONARY ANGIOGRAPHY N/A 05/05/2021   Procedure: RIGHT/LEFT HEART CATH AND CORONARY ANGIOGRAPHY;  Surgeon: Wonda Sharper, MD;  Location: [facility] INVASIVE CV LAB;  Service: Cardiovascular;  Laterality: N/A;   TEE WITHOUT CARDIOVERSION N/A 02/09/2021    Procedure: TRANSESOPHAGEAL ECHOCARDIOGRAM (TEE);  Surgeon: Francyne Headland, MD;  Location: [facility] ENDOSCOPY;  Service: Cardiovascular;  Laterality: N/A;   VARICOSE VEIN SURGERY Right 1975    Social History   Socioeconomic History   Marital status: Single    Spouse name: Not on file   Number of children: Not on file   Years of education: Not on file   Highest education level: Not on file  Occupational History   Not on file  Tobacco Use   Smoking status: Former    Current packs/day: 0.00    Types: Cigarettes    Quit date: 05/21/1972    Years since quitting: 51.0    Passive exposure: Current   Smokeless tobacco: Never  Vaping Use   Vaping status: Never Used  Substance and Sexual Activity   Alcohol use: Yes    Alcohol/week: 3.0 standard drinks of alcohol    Types: 3 Glasses of wine per week    Comment: occ   Drug use: No   Sexual activity: Not on file  Other Topics Concern   Not on file  Social History Narrative   Not on file   Social Drivers of Health   Financial Resource Strain: Low Risk  (10/17/2022)   Overall Financial Resource Strain (CARDIA)    Difficulty of Paying Living Expenses: Not very hard  Food Insecurity: No Food Insecurity (10/17/2022)   Hunger Vital Sign    Worried  About Running Out of Food in the Last Year: Never true    Ran Out of Food in the Last Year: Never true  Transportation Needs: No Transportation Needs (10/17/2022)   PRAPARE - Administrator, Civil Service (Medical): No    Lack of Transportation (Non-Medical): No  Physical Activity: Sufficiently Active (10/17/2022)   Exercise Vital Sign    Days of Exercise per Week: 7 days    Minutes of Exercise per Session: 80 min  Stress: Stress Concern Present (10/17/2022)   [employer] of Occupational Health - Occupational Stress Questionnaire    Feeling of Stress : To some extent  Social Connections: Unknown (10/17/2022)   Social Connection and Isolation Panel [NHANES]    Frequency of  Communication with Friends and Family: More than three times a week    Frequency of Social Gatherings with Friends and Family: More than three times a week    Attends Religious Services: Not on file    Active Member of Clubs or Organizations: Yes    Attends Banker Meetings: More than 4 times per year    Marital Status: Divorced  Intimate Partner Violence: Not At Risk (10/17/2022)   Humiliation, Afraid, Rape, and Kick questionnaire    Fear of Current or Ex-Partner: No    Emotionally Abused: No    Physically Abused: No    Sexually Abused: No    Family History  Problem Relation Age of Onset   Atrial fibrillation Mother    Stroke Mother    Emphysema Father    Dementia Father    Heart disease Sister        CHF   Hypertension Other    Stroke Other    Cancer Other    Healthy Son    Healthy Son     Current Outpatient Medications  Medication Sig Dispense Refill   Acetaminophen  (TYLENOL  EXTRA STRENGTH PO) Take by mouth.     amoxicillin  (AMOXIL ) 500 MG tablet Take 4 tablets 1 hour prior to dental work. 4 tablet 1   apixaban  (ELIQUIS ) 5 MG TABS tablet Take 1 tablet (5 mg total) by mouth 2 (two) times daily. 180 tablet 1   Bioflavonoid Products (ESTER-C) 500-550 MG TABS Take 500-1,000 mg by mouth daily.     Cholecalciferol (VITAMIN D  PO) Take 4,000 Int'l Units by mouth daily.     Cyanocobalamin  (VITAMIN B 12 PO) Take 1,000 mcg by mouth daily. Timed release     MAGNESIUM PO Take 500 mg by mouth at bedtime.     metoprolol  tartrate (LOPRESSOR ) 25 MG tablet TAKE 1/2 TABLET BY MOUTH TWICE A DAY 90 tablet 1   Multiple Vitamins-Minerals (CENTRUM SILVER PO) Take 1 tablet by mouth daily. With iron     Zinc  30 MG TABS Take 30 mg by mouth daily. PRN     Current Facility-Administered Medications  Medication Dose Route Frequency Provider Last Rate Last Admin   Romosozumab -aqqg (EVENITY ) 105 MG/1. injection 210 mg  210 mg Subcutaneous Once Joshua Debby CROME, MD        Allergies   Allergen Reactions   Covid-19 Mrna Vacc (Moderna) Rash    Patient states 1 week after moderna had severe blistering rash from left shoulder down to left elbow, will not get second shot    REVIEW OF SYSTEMS (negative unless checked):   Cardiac:  []  Chest pain or chest pressure? []  Shortness of breath upon activity? []  Shortness of breath when lying flat? []  Irregular heart rhythm?  Vascular:  []  Pain in calf, thigh, or hip brought on by walking? []  Pain in feet at night that wakes you up from your sleep? []  Blood clot in your veins? [x]  Leg swelling?  Pulmonary:  []  Oxygen at home? []  Productive cough? []  Wheezing?  Neurologic:  []  Sudden weakness in arms or legs? []  Sudden numbness in arms or legs? []  Sudden onset of difficult speaking or slurred speech? []  Temporary loss of vision in one eye? []  Problems with dizziness?  Gastrointestinal:  []  Blood in stool? []  Vomited blood?  Genitourinary:  []  Burning when urinating? []  Blood in urine?  Psychiatric:  []  Major depression  Hematologic:  []  Bleeding problems? []  Problems with blood clotting?  Dermatologic:  []  Rashes or ulcers?  Constitutional:  []  Fever or chills?  Ear/Nose/Throat:  []  Change in hearing? []  Nose bleeds? []  Sore throat?  Musculoskeletal:  []  Back pain? []  Joint pain? []  Muscle pain?   Physical Examination     Vitals:   05/23/23 1217  BP: (!) 147/76  Pulse: 65  Resp: 18  Temp: 98.2 F (36.8 C)  TempSrc: Temporal  SpO2: 98%  Weight: 96 lb 6.4 oz (43.7 kg)  Height: 5' 2.5 (1.588 m)   Body mass index is 17.35 kg/m.  General:  WDWN in NAD; vital signs documented above Gait: Not observed HENT: WNL, normocephalic Pulmonary: normal non-labored breathing , without Rales, rhonchi,  wheezing Cardiac: regular Abdomen: soft, NT, no masses Skin: without rashes Vascular Exam/Pulses: palpable pedal pulses Extremities:mild bilateral lower extremity swelling. Several varicose  veins around the right calf and bilateral ankles. Small scab on the right inner ankle overlying previously ruptured varicosity. No surrounding erythema or tenderness  Neurologic: A&O X 3;  No focal weakness or paresthesias are detected Psychiatric:  The pt has Normal affect.   Non-invasive Vascular Imaging   RLE Venous Insufficiency Duplex (05/23/2023):  +--------------+--------+------+----------+------------+-------------------  ----+  RIGHT        Reflux  Reflux  Reflux  Diameter cmsComments                                No       Yes     Time                                         +--------------+--------+------+----------+------------+-------------------  ----+  CFV                   yes  >1 second                                       +--------------+--------+------+----------+------------+-------------------  ----+  FV prox                yes  >1 second                                       +--------------+--------+------+----------+------------+-------------------  ----+  FV mid                 yes  >1 second                                       +--------------+--------+------+----------+------------+-------------------  ----+  FV dist       no                                                            +--------------+--------+------+----------+------------+-------------------  ----+  Popliteal    no                                                            +--------------+--------+------+----------+------------+-------------------  ----+  GSV at [facility]                                        prior                                                                       ablation/stripping        +--------------+--------+------+----------+------------+-------------------  ----+  GSV prox thigh                                    prior                                                                        ablation/stripping        +--------------+--------+------+----------+------------+-------------------  ----+  GSV mid thigh                                     prior                                                                       ablation/stripping        +--------------+--------+------+----------+------------+-------------------  ----+  GSV dist thigh                                    prior  ablation/stripping        +--------------+--------+------+----------+------------+-------------------  ----+  GSV at knee                                       prior                                                                       ablation/stripping        +--------------+--------+------+----------+------------+-------------------  ----+  SSV Pop Fossa          yes   >500 ms     0.843                              +--------------+--------+------+----------+------------+-------------------  ----+  SSV prox calf          yes   >500 ms     0.424                              +--------------+--------+------+----------+------------+-------------------  ----+  SSV mid calf           yes   >500 ms     0.387                                Medical Decision Making   ICEY TELLO is a 83 y.o. female who presents for evaluation of a ruptured varicosity  Based on the patient's duplex, there is reflux in the right common femoral vein, proximal femoral vein, and mid femoral vein. The GSV has previously been stripped. There is also reflux in the small saphenous vein from the popliteal fossa to the mid calf. The small saphenous is a fairly decent size and may be amenable to ablation The patient has a long history of bilateral lower extremity swelling causing discomfort and heaviness. She has had two ruptured varicosity events around the right ankle, once in  2020 and once a few days ago.  She does wear knee high compression stockings daily and elevates her legs. Typically this helps control her symptoms. However she continues to develop more varicose veins around her right ankle Given that the patient has diffuse incompetence of her right smaller saphenous vein, she may benefit from future ablation. This may help with her lower extremity swelling and reduce her future risk of another ruptured varicosity I have encouraged her to keep a clean bandage on her scab on the right inner ankle. This should heal on its on but may take several weeks She will continue to wear her compression stockings and elevate her legs. She can follow up with one of our Mds in a couple months for repeat evaluation.   Khyan Oats SHAUNNA Holster, PA-C [facility] Office: [phone]  05/23/2023, 12:08 PM  Clinic MD: [place]

## 2023-05-23 NOTE — Telephone Encounter (Deleted)
 Helen Davis

## 2023-05-23 NOTE — Telephone Encounter (Signed)
 This has been addressed.

## 2023-05-24 ENCOUNTER — Other Ambulatory Visit (HOSPITAL_BASED_OUTPATIENT_CLINIC_OR_DEPARTMENT_OTHER): Payer: Self-pay

## 2023-05-29 ENCOUNTER — Telehealth: Payer: Self-pay | Admitting: Emergency Medicine

## 2023-05-29 NOTE — Telephone Encounter (Signed)
 Patient is in office. Would like to schedule a CT Scan. Prefers afternoons. [phone]

## 2023-05-30 ENCOUNTER — Encounter: Payer: Self-pay | Admitting: Emergency Medicine

## 2023-05-30 NOTE — Telephone Encounter (Signed)
 Pt has been scheduled. I will get into contact with them to give them the details of their appointment.

## 2023-06-05 ENCOUNTER — Ambulatory Visit
Admission: RE | Admit: 2023-06-05 | Discharge: 2023-06-05 | Disposition: A | Discharge status: Home / Self Care | Payer: Medicare Other | Source: Ambulatory Visit | Attending: Emergency Medicine | Admitting: Emergency Medicine

## 2023-06-05 ENCOUNTER — Other Ambulatory Visit: Payer: Medicare Other

## 2023-06-05 ENCOUNTER — Other Ambulatory Visit (HOSPITAL_BASED_OUTPATIENT_CLINIC_OR_DEPARTMENT_OTHER): Payer: Self-pay | Admitting: Cardiology

## 2023-06-05 DIAGNOSIS — I4819 Other persistent atrial fibrillation: Secondary | ICD-10-CM

## 2023-06-05 DIAGNOSIS — I700 Atherosclerosis of aorta: Secondary | ICD-10-CM | POA: Diagnosis not present

## 2023-06-05 DIAGNOSIS — K449 Diaphragmatic hernia without obstruction or gangrene: Secondary | ICD-10-CM | POA: Diagnosis not present

## 2023-06-05 DIAGNOSIS — R918 Other nonspecific abnormal finding of lung field: Secondary | ICD-10-CM | POA: Diagnosis not present

## 2023-06-05 DIAGNOSIS — R911 Solitary pulmonary nodule: Secondary | ICD-10-CM

## 2023-06-05 NOTE — Telephone Encounter (Signed)
 Prescription refill request for Eliquis  received. Indication:afib Last office visit:10/24 Scr:0.66  11/24 Age: 83 Weight:43.7 kg  Under review for dose Eliquis  reduction

## 2023-06-05 NOTE — Telephone Encounter (Signed)
 Received notification from [employer] that Evenity  is not covered through your patient's medical benefit at this time.

## 2023-06-06 ENCOUNTER — Encounter (HOSPITAL_BASED_OUTPATIENT_CLINIC_OR_DEPARTMENT_OTHER): Payer: Self-pay

## 2023-06-14 ENCOUNTER — Ambulatory Visit: Payer: Medicare Other | Admitting: Emergency Medicine

## 2023-06-24 ENCOUNTER — Other Ambulatory Visit: Payer: Self-pay | Admitting: Internal Medicine

## 2023-06-24 ENCOUNTER — Telehealth: Payer: Self-pay | Admitting: Internal Medicine

## 2023-06-24 ENCOUNTER — Other Ambulatory Visit (HOSPITAL_COMMUNITY): Payer: Self-pay

## 2023-06-24 DIAGNOSIS — M810 Age-related osteoporosis without current pathological fracture: Secondary | ICD-10-CM

## 2023-06-24 MED ORDER — DENOSUMAB 60 MG/ML ~~LOC~~ SOSY
60.0000 mg | PREFILLED_SYRINGE | Freq: Once | SUBCUTANEOUS | 0 refills | Status: DC
  Filled 2023-06-24 – 2023-06-26 (×2): qty 1, 180d supply, fill #0

## 2023-06-24 NOTE — Telephone Encounter (Signed)
Copied from CRM [id number]. Topic: Appointments - Scheduling Inquiry for Clinic >> Jun 24, 2023  1:40 PM Helen Davis wrote: Reason for CRM: patient is wanting to schedule Prolia injections, she stated she is aware that it will need to be sent to insurance for prior authorization    patient callback [phone]  ---  Please start prolia PA

## 2023-06-24 NOTE — Telephone Encounter (Signed)
Please initiate PA for prolia.  Dr.Jones, please advise patient starting medication

## 2023-06-25 ENCOUNTER — Encounter (HOSPITAL_COMMUNITY): Payer: Self-pay

## 2023-06-25 ENCOUNTER — Other Ambulatory Visit: Payer: Self-pay

## 2023-06-25 ENCOUNTER — Telehealth: Payer: Self-pay

## 2023-06-25 ENCOUNTER — Other Ambulatory Visit (HOSPITAL_COMMUNITY): Payer: Self-pay

## 2023-06-25 NOTE — Progress Notes (Signed)
 Pharmacy Patient Advocate Encounter  Insurance verification completed.   The patient is insured through [facility]    Ran test claim for Prolia . Currently a quantity of 1ml is a 180 day supply and the co-pay is $882.21.  Patient may be eligible for a Medicare prescription Payment plan. The patient will need to reach out to their insurance company to enroll in the payment plan to spread out their payments throughout the year, If available.  This test claim was processed through [facility]- copay amounts may vary at other pharmacies due to pharmacy/plan contracts, or as the patient moves through the different stages of their insurance plan.

## 2023-06-25 NOTE — Telephone Encounter (Signed)
 Pharmacy Patient Advocate Encounter   Received notification from  test claim  that prior authorization for Prolia  is required/requested for pharmacy claim.   Insurance verification completed.   The patient is insured through [facility] .   Per test claim: PA required; PA submitted to above mentioned insurance via Phone Key/confirmation #/EOC -- Status is pending   Phone# [phone]  Prior authorization not required for [employer] and [employer]

## 2023-06-25 NOTE — Telephone Encounter (Signed)
 Copied from CRM [id number]. Topic: General - Other >> Jun 25, 2023 11:31 AM Franky GRADE wrote: Reason for CRM: Crystal from [facility] is calling regarding authorization for the Prolia  injection. Prolia  injection was approved, approval date 06/25/2023 good for a year. She will be faxing the authorization.

## 2023-06-25 NOTE — Telephone Encounter (Signed)
 Marland Kitchen

## 2023-06-26 ENCOUNTER — Other Ambulatory Visit: Payer: Self-pay

## 2023-06-26 NOTE — Progress Notes (Signed)
 Specialty Pharmacy Initial Fill Coordination Note  Helen Davis is a 83 y.o. female contacted today regarding initial fill of specialty medication(s) Denosumab  (PROLIA )   Patient requested Courier to Provider Office   Delivery date: 06/27/23   Verified address: [facility] at [address]   Medication will be filled on 2/5.   Patient is aware of $882.21 copayment.

## 2023-06-26 NOTE — Telephone Encounter (Signed)
 Is there a price for patients prolia ?

## 2023-06-27 ENCOUNTER — Other Ambulatory Visit (HOSPITAL_COMMUNITY): Payer: Self-pay

## 2023-06-27 NOTE — Telephone Encounter (Addendum)
 Pt ready for scheduling for Prolia  on or after : 06/27/23  Out-of-pocket cost due at time of visit: $325  Number of injection/visits approved: --  Primary: [employer] of [place] - Medicare Prolia  co-insurance: 20% Admin fee co-insurance: 0%  Secondary: N/A Prolia  co-insurance:  Admin fee co-insurance:   Medical Benefit Details: Date Benefits were checked: 05/22/23 Deductible: no/ Coinsurance: 20%/ Admin Fee: 0%  Prior Auth: Approved - pharmacy PA# Expiration Date:   # of doses approved:  Prior Auth: not required - medical buy & bil PA# Expiration Date:   # of doses approved:  Pharmacy benefit: Copay $882.21 If patient wants fill through the pharmacy benefit please send prescription to:  [facility] , and include estimated need by date in rx notes. Pharmacy will ship medication directly to the office.  Patient not eligible for Prolia  Copay Card. Copay Card can make patient's cost as little as $25. Link to apply: [email or web address]://www.amgensupportplus.[email or web address]/copay  ** This summary of benefits is an estimation of the patient's out-of-pocket cost. Exact cost may very based on individual plan coverage.

## 2023-07-01 NOTE — Telephone Encounter (Signed)
 Copied from CRM [id number]. Topic: Appointments - Appointment Scheduling >> Jul 01, 2023 11:33 AM Isabell A wrote: Patient/patient representative is calling to schedule an appointment. Refer to attachments for appointment information.    Patient calling to schedule her Prolia  injection.  ---  Please verify if we are able to schedule pt for her shot TDW

## 2023-07-02 ENCOUNTER — Telehealth: Payer: Self-pay | Admitting: Internal Medicine

## 2023-07-02 ENCOUNTER — Other Ambulatory Visit: Payer: Self-pay | Admitting: Internal Medicine

## 2023-07-02 ENCOUNTER — Other Ambulatory Visit (HOSPITAL_COMMUNITY): Payer: Self-pay

## 2023-07-02 DIAGNOSIS — M810 Age-related osteoporosis without current pathological fracture: Secondary | ICD-10-CM

## 2023-07-02 MED ORDER — PROLIA 60 MG/ML ~~LOC~~ SOSY
60.0000 mg | PREFILLED_SYRINGE | Freq: Once | SUBCUTANEOUS | 0 refills | Status: AC
  Filled 2023-07-02: qty 1, 1d supply, fill #0

## 2023-07-02 NOTE — Telephone Encounter (Signed)
Please advise

## 2023-07-02 NOTE — Telephone Encounter (Signed)
Patient is calling in regarding the prolia injections she stated she has paid for the medication and her insurance company has approved the medication

## 2023-07-02 NOTE — Telephone Encounter (Signed)
Please advise. This is the patient I sent you earlier.

## 2023-07-03 ENCOUNTER — Other Ambulatory Visit: Payer: Self-pay

## 2023-07-03 ENCOUNTER — Other Ambulatory Visit (HOSPITAL_COMMUNITY): Payer: Self-pay

## 2023-07-08 ENCOUNTER — Other Ambulatory Visit (HOSPITAL_COMMUNITY): Payer: Self-pay

## 2023-07-08 ENCOUNTER — Other Ambulatory Visit: Payer: Self-pay

## 2023-07-15 ENCOUNTER — Other Ambulatory Visit (HOSPITAL_COMMUNITY): Payer: Self-pay

## 2023-07-15 ENCOUNTER — Other Ambulatory Visit: Payer: Self-pay

## 2023-07-15 ENCOUNTER — Ambulatory Visit: Payer: Medicare Other

## 2023-07-15 NOTE — Progress Notes (Signed)
 Patient's son (drug rep) called last week to confirm copayment on Prolia and confirm as to why the office did not complete as buy and bill. Advised him to contact the office if he had questions/concerns in regards to this. He called back today and spoke to [place] asking if Prolia can be sent back to the pharmacy from the provider office since it has not yet been injected if they agree to buy and bill. Verlon Au advised that if he has approval from the office that they will be able to complete as buy and bill, we will obtain manager approval to have the medication returned to pharmacy. Advised as well that since medication was filled in early February he should speak to office as soon as possible so funds can be returned to patient within 30 days if that is the outcome. He will confirm with office and call back.

## 2023-07-16 ENCOUNTER — Other Ambulatory Visit: Payer: Self-pay

## 2023-07-16 ENCOUNTER — Telehealth: Payer: Self-pay

## 2023-07-16 NOTE — Telephone Encounter (Signed)
 Copied from CRM [id number]. Topic: Clinical - Prescription Issue >> Jul 16, 2023  1:33 PM Isabell A wrote:  Reason for CRM: Tiffany from [facility] specialty pharmacy calling in regard to Prolia injection - in regards to buy and bill.   Callback number: [phone]

## 2023-07-16 NOTE — Progress Notes (Signed)
 Waiting on office to call me back to confirm they are able to do a buy and bill.

## 2023-07-17 ENCOUNTER — Telehealth: Payer: Self-pay | Admitting: *Deleted

## 2023-07-17 ENCOUNTER — Other Ambulatory Visit: Payer: Self-pay

## 2023-07-17 NOTE — Telephone Encounter (Signed)
 Medication scheduled for pickup by Pharmacy on Friday 2/28. Front desk aware of medication pickup if Clinical Supervisor is off site during arrival.

## 2023-07-17 NOTE — Telephone Encounter (Signed)
 ATC patient regarding PFT that does not appear to have been done.  I do not see where it has been scheduled and cancelled or she was a no show.  She needs to be scheduled for the PFT.  LVM to return call.

## 2023-07-18 ENCOUNTER — Ambulatory Visit: Payer: Medicare Other | Admitting: Emergency Medicine

## 2023-07-18 ENCOUNTER — Encounter: Payer: Self-pay | Admitting: Emergency Medicine

## 2023-07-18 VITALS — BP 100/62 | HR 65 | Temp 97.5°F | Ht 63.0 in | Wt 96.4 lb

## 2023-07-18 DIAGNOSIS — J449 Chronic obstructive pulmonary disease, unspecified: Secondary | ICD-10-CM | POA: Diagnosis not present

## 2023-07-18 DIAGNOSIS — R911 Solitary pulmonary nodule: Secondary | ICD-10-CM

## 2023-07-18 NOTE — Assessment & Plan Note (Signed)
 She never got her pulmonary function testing because she did not have any recurrent episodes of lightheadedness.  She has a great functional capacity, does not get shortness of breath with walking.  In the end she thought that this may have been related to anxiety and did not want to do any further testing.  She has not had any more episodes.  I think we can defer the PFT at this time

## 2023-07-18 NOTE — Assessment & Plan Note (Signed)
 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  We now have 2 years of stability.  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  If she develops signs or symptoms of infectious process or any respiratory issues then we could consider checking her next CT sooner.  Otherwise I will plan to get it in January 2026.  We reviewed your CT scan of the chest today.  The right upper lobe pulmonary nodule is stable compared with your priors.  This is good news.  We have 2 years of stability. We will plan to repeat your CT scan of the chest in January 2026. Please call our office if you develop any new respiratory symptoms including cough, mucus production.  Call also if you have any new constitutional symptoms like fever, chills, weight loss.  If so then we might consider repeating your imaging sooner. Follow Dr. Delton Coombes in 1 year so we can review your CT at that time.

## 2023-07-18 NOTE — Progress Notes (Signed)
 Subjective:    Patient ID: Helen Davis, female    DOB: [birth year], 83 y.o.   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  She also has CAD with a bioprosthetic mitral valve replacement and atrial fibrillation.  She has a right upper lobe groundglass pulmonary nodule that we have been following with serial imaging.  Also some mild right middle lobe atelectasis.  She was last seen in our office for episodic exertional shortness of breath and associated lightheadedness.  PFTs were ordered but not yet done.  She did not have any ambulatory desaturation on 10/05/2022. She is able to walk over 2.5 miles - feels great. She has not experienced any more episodes of feeling weak or lightheaded.   CT chest 06/05/2023 reviewed by me shows no mediastinal or or hilar adenopathy, some hyperinflation with mild biapical pleural-parenchymal scarring.  The irregular groundglass subsolid anterior right upper lobe nodule has not changed in size or appearance, remains 2.4 x 1.8 cm with a stable solid component of 6 mm.  Mild ectatic peripheral bronchus in the right middle lobe.  No new nodules or infiltrates.   Review of Systems As per HPI   Past Medical History:  Diagnosis Date   ABNORMAL EKG    Anemia    Breast cancer (HCC)    Fatigue    Mitral valve disorders(424.0)    Vitamin D deficiency      Family History  Problem Relation Age of Onset   Atrial fibrillation Mother    Stroke Mother    Emphysema Father    Dementia Father    Heart disease Sister        CHF   Hypertension Other    Stroke Other    Cancer Other    Healthy Son    Healthy Son     Sister > lung cancer   Social History   Socioeconomic History   Marital status: Single    Spouse name: Not on file   Number of children: Not on file   Years of education: Not on file   Highest education level: Not  on file  Occupational History   Not on file  Tobacco Use   Smoking status: Former    Current packs/day: 0.00    Types: Cigarettes    Quit date: 05/21/1972    Years since quitting: 51.1    Passive exposure: Current   Smokeless tobacco: Never  Vaping Use   Vaping status: Never Used  Substance and Sexual Activity   Alcohol use: Yes    Alcohol/week: 3.0 standard drinks of alcohol    Types: 3 Glasses of wine per week    Comment: occ   Drug use: No   Sexual activity: Not on file  Other Topics Concern   Not on file  Social History Narrative   Not on file   Social Drivers of Health   Financial Resource Strain: Low Risk  (10/17/2022)   Overall Financial Resource Strain (CARDIA)    Difficulty of Paying Living Expenses: Not very hard  Food Insecurity: No Food Insecurity (10/17/2022)   Hunger Vital Sign    Worried About Running Out of Food in the Last Year: Never true    Ran Out of Food in the Last Year: Never true  Transportation Needs: No Transportation Needs (10/17/2022)   PRAPARE - Administrator, Civil Service (Medical): No  Lack of Transportation (Non-Medical): No  Physical Activity: Sufficiently Active (10/17/2022)   Exercise Vital Sign    Days of Exercise per Week: 7 days    Minutes of Exercise per Session: 80 min  Stress: Stress Concern Present (10/17/2022)   [employer] of Occupational Health - Occupational Stress Questionnaire    Feeling of Stress : To some extent  Social Connections: Unknown (10/17/2022)   Social Connection and Isolation Panel [NHANES]    Frequency of Communication with Friends and Family: More than three times a week    Frequency of Social Gatherings with Friends and Family: More than three times a week    Attends Religious Services: Not on Marketing executive or Organizations: Yes    Attends Banker Meetings: More than 4 times per year    Marital Status: Divorced  Intimate Partner Violence: Not At Risk  (10/17/2022)   Humiliation, Afraid, Rape, and Kick questionnaire    Fear of Current or Ex-Partner: No    Emotionally Abused: No    Physically Abused: No    Sexually Abused: No    From [place], has lived in [place], in [place] No hx TB or pos PPD Was a Runner, broadcasting/film/video, 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  2nd hand smoke exposure.   Allergies  Allergen Reactions   Covid-19 Mrna Vacc (Moderna) Rash    Patient states 1 week after moderna had severe blistering rash from left shoulder down to left elbow, will not get second shot     Outpatient Medications Prior to Visit  Medication Sig Dispense Refill   Acetaminophen (TYLENOL EXTRA STRENGTH PO) Take by mouth.     apixaban (ELIQUIS) 2.5 MG TABS tablet Take 1 tablet (2.5 mg total) by mouth 2 (two) times daily. 60 tablet 5   Bioflavonoid Products (ESTER-C) 500-550 MG TABS Take 500-1,000 mg by mouth daily.     Cholecalciferol (VITAMIN D PO) Take 4,000 Int'l Units by mouth daily.     Cyanocobalamin (VITAMIN B 12 PO) Take 1,000 mcg by mouth daily. Timed release     MAGNESIUM PO Take 500 mg by mouth at bedtime.     metoprolol tartrate (LOPRESSOR) 25 MG tablet TAKE 1/2 TABLET BY MOUTH TWICE A DAY 90 tablet 1   Zinc 30 MG TABS Take 30 mg by mouth daily. PRN     amoxicillin (AMOXIL) 500 MG tablet Take 4 tablets 1 hour prior to dental work. (Patient not taking: Reported on 07/18/2023) 4 tablet 1   Multiple Vitamins-Minerals (CENTRUM SILVER PO) Take 1 tablet by mouth daily. With iron (Patient not taking: Reported on 07/18/2023)     Facility-Administered Medications Prior to Visit  Medication Dose Route Frequency Provider Last Rate Last Admin   Romosozumab-aqqg (EVENITY) 105 MG/1. injection 210 mg  210 mg Subcutaneous Once Etta Grandchild, MD            Objective:   Physical Exam Vitals:   07/18/23 1542  BP: 100/62  Pulse: 65  Temp: (!) 97.5 F (36.4 C)  TempSrc: Temporal  SpO2: 99%  Weight: 96 lb 6.4 oz (43.7 kg)  Height: 5\' 3"  (1.6 m)   Gen: Pleasant,  well-nourished, in no distress,  normal affect  ENT: No lesions,  mouth clear,  oropharynx clear, no postnasal drip  Neck: No JVD, no stridor  Lungs: No use of accessory muscles, no crackles or wheezing on normal respiration, no wheeze on forced expiration  Cardiovascular: RRR, S1 and S2 click.  No  murmur  Musculoskeletal: No deformities, no cyanosis or clubbing  Neuro: alert, awake, non focal  Skin: Warm, no lesions or rash      Assessment & Plan:  Pulmonary nodule Stable right upper lobe mixed density nodule.  We now have 2 years of stability.  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  If she develops signs or symptoms of infectious process or any respiratory issues then we could consider checking her next CT sooner.  Otherwise I will plan to get it in January 2026.  We reviewed your CT scan of the chest today.  The right upper lobe pulmonary nodule is stable compared with your priors.  This is good news.  We have 2 years of stability. We will plan to repeat your CT scan of the chest in January 2026. Please call our office if you develop any new respiratory symptoms including cough, mucus production.  Call also if you have any new constitutional symptoms like fever, chills, weight loss.  If so then we might consider repeating your imaging sooner. Follow Dr. Delton Coombes in 1 year so we can review your CT at that time.  COPD (chronic obstructive pulmonary disease) (HCC) She never got her pulmonary function testing because she did not have any recurrent episodes of lightheadedness.  She has a great functional capacity, does not get shortness of breath with walking.  In the end she thought that this may have been related to anxiety and did not want to do any further testing.  She has not had any more episodes.  I think we can defer the PFT at this time    Levy Pupa, MD, PhD 07/18/2023, 4:19 PM [facility] [phone] or if no answer before 7:00PM call [phone] For any issues after 7:00PM please call eLink [phone]

## 2023-07-18 NOTE — Patient Instructions (Signed)
 We reviewed your CT scan of the chest today.  The right upper lobe pulmonary nodule is stable compared with your priors.  This is good news.  We have 2 years of stability. We will plan to repeat your CT scan of the chest in January 2026. Please call our office if you develop any new respiratory symptoms including cough, mucus production.  Call also if you have any new constitutional symptoms like fever, chills, weight loss.  If so then we might consider repeating your imaging sooner. Follow Dr. Delton Coombes in 1 year so we can review your CT at that time.

## 2023-07-19 ENCOUNTER — Other Ambulatory Visit (HOSPITAL_COMMUNITY): Payer: Self-pay

## 2023-07-19 ENCOUNTER — Other Ambulatory Visit: Payer: Self-pay

## 2023-07-19 NOTE — Progress Notes (Addendum)
 Per patient son/provider office, Rx should have been filled as buy and bill, not sent to pharmacy. Retrieved medication from provider office, refunded copayment to patient credit card, and returned to stock as authorized by [place]. Disenrolled.

## 2023-07-22 ENCOUNTER — Telehealth: Payer: Self-pay | Admitting: Internal Medicine

## 2023-07-22 NOTE — Telephone Encounter (Signed)
 Please call patient concerning Prolia injection - patient wants to schedule and be sure that it goes through her medical insurance - Please call [phone]

## 2023-07-25 ENCOUNTER — Ambulatory Visit

## 2023-07-25 DIAGNOSIS — M810 Age-related osteoporosis without current pathological fracture: Secondary | ICD-10-CM

## 2023-07-25 MED ORDER — DENOSUMAB 60 MG/ML ~~LOC~~ SOSY
60.0000 mg | PREFILLED_SYRINGE | Freq: Once | SUBCUTANEOUS | Status: AC
Start: 2023-07-25 — End: 2023-07-25
  Administered 2023-07-25: 60 mg via SUBCUTANEOUS

## 2023-07-25 MED ORDER — DENOSUMAB 60 MG/ML ~~LOC~~ SOSY
60.0000 mg | PREFILLED_SYRINGE | Freq: Once | SUBCUTANEOUS | Status: DC
Start: 2023-08-08 — End: 2023-07-25

## 2023-07-25 NOTE — Progress Notes (Signed)
After obtaining consent, and per orders of Dr. Ronnald Ramp, injection of Prolia given by Marrian Salvage. Patient instructed to report any adverse reaction to me immediately.

## 2023-08-12 ENCOUNTER — Encounter: Payer: Self-pay | Admitting: Internal Medicine

## 2023-08-12 NOTE — Telephone Encounter (Signed)
 Patient is needing to be seen sooner then what is available she would like a call back regarding this

## 2023-08-16 ENCOUNTER — Ambulatory Visit: Admitting: Physician Assistant

## 2023-08-16 ENCOUNTER — Encounter: Payer: Self-pay | Admitting: Physician Assistant

## 2023-08-16 DIAGNOSIS — M1711 Unilateral primary osteoarthritis, right knee: Secondary | ICD-10-CM

## 2023-08-16 MED ORDER — LIDOCAINE HCL 1 % IJ SOLN
3.0000 mL | INTRAMUSCULAR | Status: AC | PRN
  Administered 2023-08-16: 3 mL

## 2023-08-16 MED ORDER — METHYLPREDNISOLONE ACETATE 40 MG/ML IJ SUSP
40.0000 mg | INTRAMUSCULAR | Status: AC | PRN
  Administered 2023-08-16: 40 mg via INTRA_ARTICULAR

## 2023-08-16 NOTE — Progress Notes (Signed)
 Office Visit Note   Patient: Helen Davis           Date of Birth: [birth year]           MRN: [id number] Visit Date: 08/16/2023              Requested by: Etta Grandchild, MD [address] [place],  [place] [address] PCP: Etta Grandchild, MD  No chief complaint on file.     HPI: Patient is a pleasant 82 year old woman comes in today requesting an injection into her right knee she has a history of osteoarthritis is done well with steroid injections  Assessment & Plan: Visit Diagnoses: Osteoarthritis right knee  Plan: Micah Flesher forward with injection no problems may follow-up as needed  Follow-Up Instructions: No follow-ups on file.   Ortho Exam  Patient is alert, oriented, no adenopathy, well-dressed, normal affect, normal respiratory effort. Right knee no effusion no erythema compartments are soft and nontender no evidence of infection or cellulitis she is neurovascular intact  Imaging: No results found. No images are attached to the encounter.  Labs: Lab Results  Component Value Date   HGBA1C 5.1 01/29/2022   HGBA1C 5.4 09/12/2021   HGBA1C 4.8 01/26/2021     Lab Results  Component Value Date   ALBUMIN 4.0 04/02/2023   ALBUMIN 3.9 10/16/2022   ALBUMIN 4.0 08/13/2022    Lab Results  Component Value Date   MG 2.2 01/29/2022   MG 2.1 01/26/2021   MG 2.0 08/04/2020   Lab Results  Component Value Date   VD25OH 89.87 04/15/2023   VD25OH 81 01/29/2022   VD25OH 76 01/26/2021    No results found for: "PREALBUMIN"    Latest Ref Rng & Units 04/02/2023    2:24 PM 10/16/2022    2:35 PM 10/05/2022    3:09 PM  CBC EXTENDED  WBC 4.0 - 10.5 K/uL 7.2  6.0  7.2   RBC 3.87 - 5.11 MIL/uL 3.79  3.61  3.77   Hemoglobin 12.0 - 15.0 g/dL 62.7  03.5  00.9   HCT 36.0 - 46.0 % 36.1  34.1  35.7   Platelets 150 - 400 K/uL 256  300  346.0   NEUT# 1.7 - 7.7 K/uL 4.6  3.7  4.7   Lymph# 0.7 - 4.0 K/uL 1.5  1.3  1.4      There is no height or weight on file to calculate  BMI.  Orders:  No orders of the defined types were placed in this encounter.  No orders of the defined types were placed in this encounter.    Procedures: Large Joint Inj: R knee on 08/16/2023 2:36 PM Indications: pain and diagnostic evaluation Details: 25 G 1.5 in needle, anterolateral approach  Arthrogram: No  Medications: 40 mg methylPREDNISolone acetate 40 MG/ML; 3 mL lidocaine 1 % Outcome: tolerated well, no immediate complications Procedure, treatment alternatives, risks and benefits explained, specific risks discussed. Consent was given by the patient.    Clinical Data: No additional findings.  ROS:  All other systems negative, except as noted in the HPI. Review of Systems  Objective: Vital Signs: LMP  (LMP Unknown)   Specialty Comments:  No specialty comments available.  PMFS History: Patient Active Problem List   Diagnosis Date Noted  . Encounter for general adult medical examination with abnormal findings 04/05/2023  . PAF (paroxysmal atrial fibrillation) (HCC) 10/05/2022  . 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 Pulmonary nodule 07/25/2021  .  Screening for ischemic heart disease 01/26/2021  . Elevated BP without diagnosis of hypertension 08/03/2020  . Unilateral primary osteoarthritis, right knee 05/13/2019  . Varicose veins with ulcer, right (HCC) 02/16/2019  . COPD (chronic obstructive pulmonary disease) (HCC) 12/09/2015  . Mild malnutrition (HCC) 12/08/2015  . DJD (degenerative joint disease) 07/28/2015  . Mixed hyperlipidemia 11/15/2014  . Osteoporosis 11/15/2014  . Vitamin D deficiency   . Nonrheumatic mitral valve regurgitation 10/11/2009   Past Medical History:  Diagnosis Date  . ABNORMAL EKG   . Anemia   . Breast cancer (HCC)   . Fatigue   . Mitral valve disorders(424.0)   . Vitamin D deficiency     Family History  Problem Relation Age of Onset  . Atrial fibrillation Mother   . Stroke Mother   .  Emphysema Father   . Dementia Father   . Heart disease Sister        CHF  . Hypertension Other   . Stroke Other   . Cancer Other   . Healthy Son   . Healthy Son     Past Surgical History:  Procedure Laterality Date  . APPENDECTOMY  1966  . MASTECTOMY    . MITRAL VALVE REPLACEMENT  05/29/2021  . RIGHT/LEFT HEART CATH AND CORONARY ANGIOGRAPHY N/A 05/05/2021   Procedure: RIGHT/LEFT HEART CATH AND CORONARY ANGIOGRAPHY;  Surgeon: Tonny Bollman, MD;  Location: [facility] INVASIVE CV LAB;  Service: Cardiovascular;  Laterality: N/A;  . TEE WITHOUT CARDIOVERSION N/A 02/09/2021   Procedure: TRANSESOPHAGEAL ECHOCARDIOGRAM (TEE);  Surgeon: Thurmon Fair, MD;  Location: [facility] ENDOSCOPY;  Service: Cardiovascular;  Laterality: N/A;  . VARICOSE VEIN SURGERY Right 1975   Social History   Occupational History  . Not on file  Tobacco Use  . Smoking status: Former    Current packs/day: 0.00    Types: Cigarettes    Quit date: 05/21/1972    Years since quitting: 51.2    Passive exposure: Current  . Smokeless tobacco: Never  Vaping Use  . Vaping status: Never Used  Substance and Sexual Activity  . Alcohol use: Yes    Alcohol/week: 3.0 standard drinks of alcohol    Types: 3 Glasses of wine per week    Comment: occ  . Drug use: No  . Sexual activity: Not on file

## 2023-08-19 ENCOUNTER — Encounter: Payer: Self-pay | Admitting: Surgery

## 2023-08-19 ENCOUNTER — Ambulatory Visit: Payer: Medicare Other | Admitting: Surgery

## 2023-08-19 VITALS — BP 108/63 | HR 65 | Temp 97.7°F | Ht 63.0 in | Wt 94.0 lb

## 2023-08-19 DIAGNOSIS — I83891 Varicose veins of right lower extremities with other complications: Secondary | ICD-10-CM | POA: Diagnosis not present

## 2023-08-19 NOTE — Progress Notes (Signed)
 [facility] [place]  Patient name: Helen Davis MRN: [id number] DOB: Oct 18, 1940 Sex: female   REASON FOR VISIT:    Follow up  HISOTRY OF PRESENT ILLNESS:    Helen Davis is a 83 y.o. 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  This stopped with manual pressure for 20 minutes.  She has not had any subsequent episodes.  She did see Dr. Darrick Penna in 2020 for a similar issue.  There was consideration for small saphenous ablation however this never occurred.  The patient does complain of bilateral leg swelling.  She will wear compression socks and keep her legs elevated.  There have been several varicosities on her legs and ankles that have developed over time.  She denies any prior history of DVT.  She has undergone stripping of her right saphenous vein many years ago PAST MEDICAL HISTORY:   Past Medical History:  Diagnosis Date   ABNORMAL EKG    Anemia    Breast cancer (HCC)    Fatigue    Mitral valve disorders(424.0)    Vitamin D deficiency      FAMILY HISTORY:   Family History  Problem Relation Age of Onset   Atrial fibrillation Mother    Stroke Mother    Emphysema Father    Dementia Father    Heart disease Sister        CHF   Hypertension Other    Stroke Other    Cancer Other    Healthy Son    Healthy Son     SOCIAL HISTORY:   Social History   Tobacco Use   Smoking status: Former    Current packs/day: 0.00    Types: Cigarettes    Quit date: 05/21/1972    Years since quitting: 51.2    Passive exposure: Current   Smokeless tobacco: Never  Substance Use Topics   Alcohol use: Yes    Alcohol/week: 3.0 standard drinks of alcohol    Types: 3 Glasses of wine per week    Comment: occ     ALLERGIES:   Allergies  Allergen Reactions   Covid-19 Mrna Vacc (Moderna) Rash    Patient states 1 week after moderna had severe  blistering rash from left shoulder down to left elbow, will not get second shot     CURRENT MEDICATIONS:   Current Outpatient Medications  Medication Sig Dispense Refill   Acetaminophen (TYLENOL EXTRA STRENGTH PO) Take by mouth.     apixaban (ELIQUIS) 2.5 MG TABS tablet Take 1 tablet (2.5 mg total) by mouth 2 (two) times daily. 60 tablet 5   Bioflavonoid Products (ESTER-C) 500-550 MG TABS Take 500-1,000 mg by mouth daily.     Cholecalciferol (VITAMIN D PO) Take 4,000 Int'l Units by mouth daily.     Cyanocobalamin (VITAMIN B 12 PO) Take 1,000 mcg by mouth daily. Timed release     MAGNESIUM PO Take 500 mg by mouth at bedtime.     metoprolol tartrate (LOPRESSOR) 25 MG tablet TAKE 1/2 TABLET BY MOUTH TWICE A DAY 90 tablet 1   Zinc 30 MG TABS Take 30 mg by mouth daily. PRN     amoxicillin (AMOXIL) 500 MG tablet Take 4 tablets 1 hour prior to dental work. (Patient not taking: Reported on 08/19/2023) 4 tablet 1   Multiple Vitamins-Minerals (CENTRUM SILVER PO) Take 1 tablet by mouth daily. With iron (Patient not taking:  Reported on 07/18/2023)     Current Facility-Administered Medications  Medication Dose Route Frequency Provider Last Rate Last Admin   Romosozumab-aqqg (EVENITY) 105 MG/1. injection 210 mg  210 mg Subcutaneous Once Etta Grandchild, MD        REVIEW OF SYSTEMS:   [X]  denotes positive finding, [ ]  denotes negative finding Cardiac  Comments:  Chest pain or chest pressure:    Shortness of breath upon exertion:    Short of breath when lying flat:    Irregular heart rhythm:        Vascular    Pain in calf, thigh, or hip brought on by ambulation:    Pain in feet at night that wakes you up from your sleep:     Blood clot in your veins:    Leg swelling:  x       Pulmonary    Oxygen at home:    Productive cough:     Wheezing:         Neurologic    Sudden weakness in arms or legs:     Sudden numbness in arms or legs:     Sudden onset of difficulty speaking or slurred  speech:    Temporary loss of vision in one eye:     Problems with dizziness:         Gastrointestinal    Blood in stool:     Vomited blood:         Genitourinary    Burning when urinating:     Blood in urine:        Psychiatric    Major depression:         Hematologic    Bleeding problems:    Problems with blood clotting too easily:        Skin    Rashes or ulcers:        Constitutional    Fever or chills:      PHYSICAL EXAM:   Vitals:   08/19/23 1218  BP: 108/63  Pulse: 65  Temp: 97.7 F (36.5 C)  SpO2: 98%  Weight: 94 lb (42.6 kg)  Height: 5\' 3"  (1.6 m)    GENERAL: The patient is a well-nourished female, in no acute distress. The vital signs are documented above. CARDIAC: There is a regular rate and rhythm.  VASCULAR: SonoSite was used to evaluate the right small saphenous vein which is markedly dilated at least 67 mm.  There are numerous small varicosities and reticular veins out onto the ankle, with hyperpigmentation PULMONARY: Non-labored respirations ABDOMEN: Soft and non-tender with normal pitched bowel sounds.  MUSCULOSKELETAL: There are no major deformities or cyanosis. NEUROLOGIC: No focal weakness or paresthesias are detected. SKIN: There are no ulcers or rashes noted. PSYCHIATRIC: The patient has a normal affect.  STUDIES:   I have reviewed the following reflux study: Venous Reflux Times  +--------------+--------+------+----------+------------+-------------------  ----+  RIGHT        Reflux  Reflux  Reflux  Diameter cmsComments                                No       Yes     Time                                         +--------------+--------+------+----------+------------+-------------------  ----+  CFV                   yes  >1 second                                       +--------------+--------+------+----------+------------+-------------------  ----+  FV prox                yes  >1 second                                        +--------------+--------+------+----------+------------+-------------------  ----+  FV mid                 yes  >1 second                                       +--------------+--------+------+----------+------------+-------------------  ----+  FV dist       no                                                            +--------------+--------+------+----------+------------+-------------------  ----+  Popliteal    no                                                            +--------------+--------+------+----------+------------+-------------------  ----+  GSV at [facility]                                        prior                                                                       ablation/stripping        +--------------+--------+------+----------+------------+-------------------  ----+  GSV prox thigh                                    prior                                                                       ablation/stripping        +--------------+--------+------+----------+------------+-------------------  ----+  GSV mid thigh  prior                                                                       ablation/stripping        +--------------+--------+------+----------+------------+-------------------  ----+  GSV dist thigh                                    prior                                                                       ablation/stripping        +--------------+--------+------+----------+------------+-------------------  ----+  GSV at knee                                       prior                                                                       ablation/stripping        +--------------+--------+------+----------+------------+-------------------  ----+  SSV Pop Fossa          yes   >500 ms      0.843                              +--------------+--------+------+----------+------------+-------------------  ----+  SSV prox calf          yes   >500 ms     0.424                              +--------------+--------+------+----------+------------+-------------------  ----+  SSV mid calf           yes   >500 ms     0.387                              +--------------+--------+------+----------+------------+------------   MEDICAL ISSUES:   CEAP class IV, right leg: The patient has had 2 significant bleeding episodes.  She has had a right saphenous vein stripped.  She does have significant superficial venous reflux in the small saphenous vein which by my evaluation with the SonoSite was markedly dilated at 7-8 mm.  I proposed 2 options, the first would be continued observation given the fact that she wears her compression stockings and is 83 years old.  The second would be to proceed with laser ablation of the right small saphenous vein.  She would like to think about this and will contact me if  she decides to proceed.    Charlena Cross, MD, FACS [facility] [phone] Pager [phone]

## 2023-09-04 ENCOUNTER — Encounter: Payer: Self-pay | Admitting: Internal Medicine

## 2023-09-04 ENCOUNTER — Ambulatory Visit (INDEPENDENT_AMBULATORY_CARE_PROVIDER_SITE_OTHER): Admitting: Internal Medicine

## 2023-09-04 VITALS — BP 126/72 | HR 63 | Temp 98.0°F | Ht 63.0 in | Wt 93.8 lb

## 2023-09-04 DIAGNOSIS — I480 Paroxysmal atrial fibrillation: Secondary | ICD-10-CM

## 2023-09-04 DIAGNOSIS — M810 Age-related osteoporosis without current pathological fracture: Secondary | ICD-10-CM

## 2023-09-04 DIAGNOSIS — T452X1A Poisoning by vitamins, accidental (unintentional), initial encounter: Secondary | ICD-10-CM | POA: Diagnosis not present

## 2023-09-04 DIAGNOSIS — E782 Mixed hyperlipidemia: Secondary | ICD-10-CM | POA: Diagnosis not present

## 2023-09-04 LAB — BASIC METABOLIC PANEL WITH GFR
BUN: 14 mg/dL (ref 6–23)
CO2: 29 meq/L (ref 19–32)
Calcium: 9.2 mg/dL (ref 8.4–10.5)
Chloride: 96 meq/L (ref 96–112)
Creatinine, Ser: 0.72 mg/dL (ref 0.40–1.20)
GFR: 78.89 mL/min
Glucose, Bld: 96 mg/dL (ref 70–99)
Potassium: 4 meq/L (ref 3.5–5.1)
Sodium: 132 meq/L — ABNORMAL LOW (ref 135–145)

## 2023-09-04 LAB — CBC WITH DIFFERENTIAL/PLATELET
Basophils Absolute: 0.1 K/uL (ref 0.0–0.1)
Basophils Relative: 0.8 % (ref 0.0–3.0)
Eosinophils Absolute: 0 K/uL (ref 0.0–0.7)
Eosinophils Relative: 0.5 % (ref 0.0–5.0)
HCT: 37.4 % (ref 36.0–46.0)
Hemoglobin: 12.3 g/dL (ref 12.0–15.0)
Lymphocytes Relative: 22.7 % (ref 12.0–46.0)
Lymphs Abs: 1.5 K/uL (ref 0.7–4.0)
MCHC: 32.8 g/dL (ref 30.0–36.0)
MCV: 96.7 fl (ref 78.0–100.0)
Monocytes Absolute: 0.8 K/uL (ref 0.1–1.0)
Monocytes Relative: 12.2 % — ABNORMAL HIGH (ref 3.0–12.0)
Neutro Abs: 4.3 K/uL (ref 1.4–7.7)
Neutrophils Relative %: 63.8 % (ref 43.0–77.0)
Platelets: 283 K/uL (ref 150.0–400.0)
RBC: 3.87 Mil/uL (ref 3.87–5.11)
RDW: 13.6 % (ref 11.5–15.5)
WBC: 6.7 K/uL (ref 4.0–10.5)

## 2023-09-04 LAB — PHOSPHORUS: Phosphorus: 2.5 mg/dL (ref 2.3–4.6)

## 2023-09-04 NOTE — Patient Instructions (Signed)
 Weak, Fragile Bones (Osteoporosis): What to Know  Osteoporosis is when the bones become thin and less dense than normal. Osteoporosis makes bones more fragile and more likely to break. Over time, the condition can cause your bones to become so weak that they break, or fracture, after a minor fall. Bones in the hip, wrist, and spine are most likely to break. What are the causes? The exact cause of this condition is not known. What increases the risk? Having family members with this condition. Taking: Steroid medicines. Anti-seizure medicines. Being female. Being 81 years old or older. Being of [place] decent. Smoking or using other products that contain nicotine or tobacco. Not exercising. What are the signs or symptoms? A broken bone might be the first sign, especially if the break results from a fall or injury that usually would not cause a bone to break. Other signs and symptoms include: Pain in the neck or low back. Being hunched over. Getting shorter. How is this diagnosed? This condition may be diagnosed based on: Your medical history. A physical exam. A bone mineral density test, also called a DXA or DEXA test. This test uses X-rays to measure how dense your bones are. How is this treated? This condition may be treated with medicines and supplements, including: Taking medicines to slow bone loss or help make the bones stronger. Taking calcium and vitamin D supplements every day. Taking hormone replacement medicines, such as estrogen for female and testosterone for males. It may also be treated by: Quitting smoking or using tobacco products. Doing exercises. Limiting how much alcohol you drink. Eating more foods with calcium and vitamin D in them. Monitoring your blood levels of calcium and vitamin D. The goal of treatment is to strengthen your bones and lower your risk for a bone break. Follow these instructions at home: Eating and drinking Eat plenty of food with  calcium and vitamin D in them. This may include: Some fish, such as salmon and tuna. Foods that have calcium and vitamin D added to them, such as some cereals. Egg yolks. Cheese. Liver.  Activity Exercise as told. Ask what things are safe for you to do. You may be told to: Do exercises that make your muscles work to hold your body weight up, such as tai chi, yoga, or walking. These are called weight-bearing exercises. Do exercises to make your muscles stronger, such as lifting weights. These are called muscle-strengthening exercises. Lifestyle Do not drink alcohol if your health care provider tells you not to drink. Your health care provider tells you not to drink. You are pregnant, may be pregnant, or are planning to become pregnant. If you drink alcohol: Limit how much you have to: 0-1 drink a day if you're female. 0-2 drinks a day if you're female. Know how much alcohol is in your drink. In the [place] one drink is one 12 oz bottle of beer (355 mL), one 5 oz glass of wine (148 mL), or one 1 oz glass of hard liquor (44 mL). Do not smoke, vape, or use nicotine or tobacco. Preventing falls Use a cane, walker, scooter, or crutches to help you move around if needed. Keep rooms well-lit and get rid of clutter. Put away things on the floor that could make you trip, such as cords and rugs. Put grab bars in bathrooms and safety rails on stairs. Use rubber mats in slippery areas, like bathrooms. Wear shoes that: Fit you well. Support your feet. Have closed toes. Have rubber soles or low  heels. Talk to your provider about all of the medicines you take. Some medicines can make you more likely to fall because they can cause dizziness or changes in blood pressure. General instructions Take your medicines only as told. Keep all follow-up visits. Your provider may want to repeat tests. Where to find more information Bone Health and Osteoporosis Foundation: bonehealthandosteoporosis.org Contact  a health care provider if: You have never been screened for osteoporosis and you are: A female who is 16 years old or older. A female who is age 28 years old or older. Get help right away if: You fall. You get hurt. This information is not intended to replace advice given to you by your health care provider. Make sure you discuss any questions you have with your health care provider. Document Revised: 03/19/2023 Document Reviewed: 11/23/2022 Elsevier Patient Education  2024 [employer].

## 2023-09-04 NOTE — Progress Notes (Unsigned)
 Subjective:  Patient ID: Helen KINNAMON, female    DOB: 02/18/1941  Age: 83 y.o. MRN: [id number]  CC: Atrial Fibrillation   HPI Helen Davis presents for f/up ----  Discussed the use of AI scribe software for clinical note transcription with the patient, who gave verbal consent to proceed.  History of Present Illness   Helen Davis is an 83 year old female who presents for a discussion regarding the potential use of antidepressants.  She is considering the use of antidepressants at the suggestion of her son, Tinnie Gens, although her oldest son, Theron Arista, believes she does not need them. She does not feel she needs an antidepressant, as she is generally active, walking every day, and engaging in social activities such as book clubs. She acknowledges spending a lot of time alone and has lost friends due to age-related circumstances, which may contribute to her son's concerns.  She experiences occasional dizziness but denies any chest pain or shortness of breath. She attributes the dizziness to a 'blip on the screen' and notes that her vision is not as good as it used to be, prompting an upcoming eye appointment.  She mentions bruising easily due to her use of Eliquis, which has been reduced in dosage since she turned 82.  She is on Prolia for osteoporosis and reports no side effects from the medication. She takes 4000 IU of vitamin D3 and 1200 mg of calcium daily. Her weight fluctuates slightly, but she does not believe she is losing weight significantly. She takes magnesium at night to aid bowel movements, which are regular.  She has a history of knee pain and has received multiple cortisone shots, with the last one being ineffective. She also reports limited use of her right hand due to tendon issues, causing her to drop things frequently.  Her hearing has declined, necessitating new hearing aids.       Outpatient Medications Prior to Visit  Medication Sig Dispense Refill    Acetaminophen (TYLENOL EXTRA STRENGTH PO) Take by mouth.     amoxicillin (AMOXIL) 500 MG tablet Take 4 tablets 1 hour prior to dental work. 4 tablet 1   apixaban (ELIQUIS) 2.5 MG TABS tablet Take 1 tablet (2.5 mg total) by mouth 2 (two) times daily. 60 tablet 5   Bioflavonoid Products (ESTER-C) 500-550 MG TABS Take 500-1,000 mg by mouth daily.     Cyanocobalamin (VITAMIN B 12 PO) Take 1,000 mcg by mouth daily. Timed release     MAGNESIUM PO Take 500 mg by mouth at bedtime.     metoprolol tartrate (LOPRESSOR) 25 MG tablet TAKE 1/2 TABLET BY MOUTH TWICE A DAY 90 tablet 1   Multiple Vitamins-Minerals (CENTRUM SILVER PO) Take 1 tablet by mouth daily. With iron     Zinc 30 MG TABS Take 30 mg by mouth daily. PRN     Cholecalciferol (VITAMIN D PO) Take 4,000 Int'l Units by mouth daily.     Romosozumab-aqqg (EVENITY) 105 MG/1. injection 210 mg      No facility-administered medications prior to visit.    ROS Review of Systems  Constitutional: Negative.  Negative for chills, diaphoresis and fatigue.  HENT: Negative.    Respiratory: Negative.  Negative for cough, chest tightness, shortness of breath and wheezing.   Cardiovascular:  Negative for chest pain, palpitations and leg swelling.  Gastrointestinal:  Negative for abdominal pain, constipation, diarrhea, nausea and vomiting.  Endocrine: Negative.   Genitourinary: Negative.  Negative for difficulty urinating.  Musculoskeletal:  Positive for  arthralgias and gait problem. Negative for back pain, joint swelling and myalgias.  Neurological:  Positive for dizziness. Negative for weakness and light-headedness.  Hematological:  Negative for adenopathy. Does not bruise/bleed easily.  Psychiatric/Behavioral: Negative.      Objective:  BP 126/72 (BP Location: Left Arm, Patient Position: Sitting, Cuff Size: Small)   Pulse 63   Temp 98 F (36.7 C) (Oral)   Ht 5\' 3"  (1.6 m)   Wt 93 lb 12.8 oz (42.5 kg)   LMP  (LMP Unknown)   SpO2 99%   BMI  16.62 kg/m   BP Readings from Last 3 Encounters:  09/04/23 126/72  08/19/23 108/63  07/18/23 100/62    Wt Readings from Last 3 Encounters:  09/04/23 93 lb 12.8 oz (42.5 kg)  08/19/23 94 lb (42.6 kg)  07/18/23 96 lb 6.4 oz (43.7 kg)    Physical Exam Vitals reviewed.  Constitutional:      Appearance: Normal appearance. She is not ill-appearing.  HENT:     Nose: Nose normal.     Mouth/Throat:     Mouth: Mucous membranes are moist.  Eyes:     General: No scleral icterus.    Conjunctiva/sclera: Conjunctivae normal.  Cardiovascular:     Rate and Rhythm: Normal rate and regular rhythm.     Heart sounds: No murmur heard.    No friction rub. No gallop.  Pulmonary:     Breath sounds: No stridor. No wheezing, rhonchi or rales.  Abdominal:     General: Abdomen is flat.     Palpations: There is no mass.     Tenderness: There is no abdominal tenderness. There is no guarding.     Hernia: No hernia is present.  Musculoskeletal:        General: Normal range of motion.     Cervical back: Neck supple. No tenderness.     Right lower leg: No edema.     Left lower leg: No edema.  Lymphadenopathy:     Cervical: No cervical adenopathy.  Skin:    General: Skin is warm and dry.  Neurological:     General: No focal deficit present.  Psychiatric:        Mood and Affect: Mood normal.        Behavior: Behavior normal.        Thought Content: Thought content normal.        Judgment: Judgment normal.     Lab Results  Component Value Date   WBC 6.7 09/04/2023   HGB 12.3 09/04/2023   HCT 37.4 09/04/2023   PLT 283.0 09/04/2023   GLUCOSE 96 09/04/2023   CHOL 177 04/15/2023   TRIG 122.0 04/15/2023   HDL 87.90 04/15/2023   LDLCALC 65 04/15/2023   ALT 18 04/02/2023   AST 23 04/02/2023   NA 132 (L) 09/04/2023   K 4.0 09/04/2023   CL 96 09/04/2023   CREATININE 0.72 09/04/2023   BUN 14 09/04/2023   CO2 29 09/04/2023   TSH 1.46 04/15/2023   HGBA1C 5.1 01/29/2022   MICROALBUR <0.2  01/29/2022    CT Chest Wo Contrast Result Date: 06/13/2023 CLINICAL DATA:  Follow-up pulmonary nodules. Breast cancer with bilateral mastectomy 1999. 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 EXAM: CT CHEST WITHOUT CONTRAST TECHNIQUE: Multidetector CT imaging of the chest was performed following the standard protocol without IV contrast. RADIATION DOSE REDUCTION: This exam was performed according to the departmental dose-optimization program which includes automated exposure control, adjustment of the mA and/or kV according  to patient size and/or use of iterative reconstruction technique. COMPARISON:  Left chest x-ray was PA and lateral 10/05/2022, with comparison made to chest CTs without contrast dated 06/04/2022 and 11/29/2021. FINDINGS: Cardiovascular: There is spray artifact from a metallic mitral valve replacement. The heart is slightly enlarged. No pericardial effusion is seen. Pulmonary arteries and veins are normal caliber. There is no aortic aneurysm noted with aortic tortuosity, and small amount of scattered calcific plaque. The unenhanced great vessels are unremarkable. Mediastinum/Nodes: No enlarged mediastinal or axillary lymph nodes. Thyroid gland, trachea, and esophagus demonstrate no significant findings. Small hiatal hernia. Old right axillary surgical clips in keeping with prior nodal dissection. Lungs/Pleura: The lungs are hyperinflated. There is mild biapical pleural-parenchymal scarring, scattered linear scar-like opacities again in both bases and a calcified left lower lobe granuloma. Again noted is an irregular ground-glass subsolid lesion, with anterior and medial pleural stranding, in the anterior base of the right upper lobe. There is no appreciable increased solid component or change in size. On 5:81 this again measures 2.4 x 1.8 cm, unchanged allowing for minor technical differences. It has a small solid component as before, again measuring 6 mm in the anterior aspect of the lesion on  5:82, also unchanged. In the right middle lobe, again noted is an impacted ectatic peripheral bronchus simulating a small nodule on 5:102. The lungs are otherwise clear. No new infiltrate is seen. No pleural effusion or thickening. Upper Abdomen: Stable hepatic cysts, largest is 3 cm in segment 4. No acute upper abdominal abnormality is seen. There is abdominal aortic atherosclerosis. Musculoskeletal: Osteopenia with degenerative change in mild kypholevoscoliosis thoracic spine. Grade 1 degenerative retrolisthesis with collapsed discs T12-L1 and L1-2. IMPRESSION: 1. Stable 2.4 x 1.8 cm irregular ground-glass subsolid lesion in the anterior base of the right upper lobe, with a staple small 6 mm solid component. Adenocarcinoma cannot be excluded. Follow up by CT is recommended in 12 months, with continued annual surveillance for a minimum of 3 years. Reference: Recommendations for the Management of Subsolid Pulmonary Nodules Detected at CT: A Statement from the Fleischner Society Radiology 2013; 266:1, [id number]. 2. Hyperinflation with additional chronic change. 3. Aortic atherosclerosis. 4. Small hiatal hernia. 5. Osteopenia and degenerative change. Aortic Atherosclerosis (ICD10-I70.0). Electronically Signed   By: Denman Fischer M.D.   On: 06/13/2023 23:52    Assessment & Plan:   PAF (paroxysmal atrial fibrillation) (HCC)- She has good R/R control. -     Basic metabolic panel with GFR; Future -     CBC with Differential/Platelet; Future  Mixed hyperlipidemia -     CBC with Differential/Platelet; Future  Osteoporosis without current pathological fracture, unspecified osteoporosis type -     VITAMIN D 25 Hydroxy (Vit-D Deficiency, Fractures); Future -     Basic metabolic panel with GFR; Future -     Phosphorus; Future -     CBC with Differential/Platelet; Future  Vitamin D toxicity, accidental or unintentional, initial encounter- Will discontinue the Vit D supplement.     Follow-up: Return in about  6 months (around 03/05/2024).  Sandra Crouch, MD

## 2023-09-05 ENCOUNTER — Telehealth: Payer: Self-pay

## 2023-09-05 ENCOUNTER — Encounter: Payer: Self-pay | Admitting: Internal Medicine

## 2023-09-05 DIAGNOSIS — T452X1A Poisoning by vitamins, accidental (unintentional), initial encounter: Secondary | ICD-10-CM | POA: Insufficient documentation

## 2023-09-05 LAB — VITAMIN D 25 HYDROXY (VIT D DEFICIENCY, FRACTURES): VITD: 119.97 ng/mL (ref 30.00–100.00)

## 2023-09-05 NOTE — Telephone Encounter (Signed)
 CRITICAL VALUE STICKER  CRITICAL VALUE: Critically High Vitamin D 119.97  RECEIVER (on-site recipient of call): Sabre Romberger Stubbs-Barnette  DATE & TIME NOTIFIED: 09/05/2023 at 8:23  MESSENGER (representative from lab): Clorinda Daniel  MD NOTIFIED: Margurite Shim  TIME OF NOTIFICATION: 8:26  RESPONSE:  Dr.Jones is messaging patient with results

## 2023-09-12 ENCOUNTER — Encounter: Payer: Self-pay | Admitting: Radiology

## 2023-09-17 ENCOUNTER — Encounter: Payer: Self-pay | Admitting: Internal Medicine

## 2023-10-10 DIAGNOSIS — K080 Exfoliation of teeth due to systemic causes: Secondary | ICD-10-CM | POA: Diagnosis not present

## 2023-10-15 DIAGNOSIS — H59811 Chorioretinal scars after surgery for detachment, right eye: Secondary | ICD-10-CM | POA: Diagnosis not present

## 2023-10-15 DIAGNOSIS — G453 Amaurosis fugax: Secondary | ICD-10-CM | POA: Diagnosis not present

## 2023-10-15 DIAGNOSIS — H524 Presbyopia: Secondary | ICD-10-CM | POA: Diagnosis not present

## 2023-10-15 DIAGNOSIS — H538 Other visual disturbances: Secondary | ICD-10-CM | POA: Diagnosis not present

## 2023-10-15 DIAGNOSIS — H35033 Hypertensive retinopathy, bilateral: Secondary | ICD-10-CM | POA: Diagnosis not present

## 2023-10-16 ENCOUNTER — Encounter: Payer: Self-pay | Admitting: Internal Medicine

## 2023-10-17 NOTE — Telephone Encounter (Signed)
 Pt has dropped off form and it is in PCP box

## 2023-10-29 ENCOUNTER — Other Ambulatory Visit (HOSPITAL_BASED_OUTPATIENT_CLINIC_OR_DEPARTMENT_OTHER): Payer: Self-pay | Admitting: Family

## 2023-10-30 ENCOUNTER — Ambulatory Visit (INDEPENDENT_AMBULATORY_CARE_PROVIDER_SITE_OTHER)

## 2023-10-30 VITALS — BP 118/62 | HR 74 | Ht 63.0 in | Wt 94.4 lb

## 2023-10-30 DIAGNOSIS — Z0000 Encounter for general adult medical examination without abnormal findings: Secondary | ICD-10-CM | POA: Diagnosis not present

## 2023-10-30 NOTE — Patient Instructions (Signed)
 Helen Davis , Thank you for taking time out of your busy schedule to complete your Annual Wellness Visit with me. I enjoyed our conversation and look forward to speaking with you again next year. I, as well as your care team,  appreciate your ongoing commitment to your health goals. Please review the following plan we discussed and let me know if I can assist you in the future. Your Game plan/ To Do List    Follow up Visits: Next Medicare AWV with our clinical staff: 11/02/2024   Have you seen your provider in the last 6 months (3 months if uncontrolled diabetes)? Yes Next Office Visit with your provider: 03/2024  Clinician Recommendations:  Aim for 30 minutes of exercise or brisk walking, 6-8 glasses of water, and 5 servings of fruits and vegetables each day. Educated and advised on getting the Shingles vaccines in 2025.      This is a list of the screening recommended for you and due dates:  Health Maintenance  Topic Date Due   Zoster (Shingles) Vaccine (1 of 2) Never done   Medicare Annual Wellness Visit  10/29/2024   DTaP/Tdap/Td vaccine (3 - Td or Tdap) 09/13/2031   Pneumonia Vaccine  Completed   DEXA scan (bone density measurement)  Completed   HPV Vaccine  Aged Out   Meningitis B Vaccine  Aged Out   Flu Shot  Discontinued   COVID-19 Vaccine  Discontinued    Advanced directives: (Copy Requested) Please bring a copy of your health care power of attorney and living will to the office to be added to your chart at your convenience. You can mail to [facility] [address] [place], [place] [address] or email to [email or web address] Advance Care Planning is important because it:  [x]  Makes sure you receive the medical care that is consistent with your values, goals, and preferences  [x]  It provides guidance to your family and loved ones and reduces their decisional burden about whether or not they are making the right decisions based on your wishes.  Follow  the link provided in your after visit summary or read over the paperwork we have mailed to you to help you started getting your Advance Directives in place. If you need assistance in completing these, please reach out to us  so that we can help you!

## 2023-10-30 NOTE — Progress Notes (Signed)
 Subjective:   Helen Davis is a 83 y.o. who presents for a Medicare Wellness preventive visit.  As a reminder, Annual Wellness Visits don't include a physical exam, and some assessments may be limited, especially if this visit is performed virtually. We may recommend an in-person follow-up visit with your provider if needed.  Visit Complete: In person  Persons Participating in Visit: Patient.  AWV Questionnaire: Yes: Patient Medicare AWV questionnaire was completed by the patient on 10/26/2023; I have confirmed that all information answered by patient is correct and no changes since this date.  Cardiac Risk Factors include: advanced age (>42men, >90 women);dyslipidemia     Objective:     Today's Vitals   10/30/23 1433  BP: 118/62  Pulse: 74  SpO2: 96%  Weight: 94 lb 6.4 oz (42.8 kg)  Height: 5' 3 (1.6 m)   Body mass index is 16.72 kg/m.     10/30/2023    2:33 PM 04/02/2023    2:48 PM 10/17/2022    2:33 PM 10/16/2022    3:18 PM 04/17/2022    1:49 PM 09/12/2021    2:45 PM 05/05/2021    6:01 AM  Advanced Directives  Does Patient Have a Medical Advance Directive? Yes Yes Yes Yes Yes Yes Yes  Type of Estate agent of [place];Living will Healthcare Power of [place];Living will Healthcare Power of [place];Living will  Healthcare Power of [place];Living will Healthcare Power of [place];Living will Healthcare Power of [place];Living will  Does patient want to make changes to medical advance directive?  No - Patient declined  No - Patient declined No - Patient declined No - Patient declined No - Patient declined  Copy of Healthcare Power of Attorney in Chart? No - copy requested No - copy requested No - copy requested  No - copy requested No - copy requested No - copy requested    Current Medications (verified) Outpatient Encounter Medications as of 10/30/2023  Medication Sig   Acetaminophen  (TYLENOL  EXTRA STRENGTH PO) Take by mouth.   amoxicillin   (AMOXIL ) 500 MG tablet Take 4 tablets 1 hour prior to dental work.   apixaban  (ELIQUIS ) 2.5 MG TABS tablet Take 1 tablet (2.5 mg total) by mouth 2 (two) times daily.   Bioflavonoid Products (ESTER-C) 500-550 MG TABS Take 500-1,000 mg by mouth daily.   Cyanocobalamin  (VITAMIN B 12 PO) Take 1,000 mcg by mouth daily. Timed release   MAGNESIUM PO Take 500 mg by mouth at bedtime.   metoprolol  tartrate (LOPRESSOR ) 25 MG tablet TAKE A HALF TABLET BY MOUTH TWICE A DAY   Multiple Vitamins-Minerals (CENTRUM SILVER PO) Take 1 tablet by mouth daily. With iron   Zinc 30 MG TABS Take 30 mg by mouth daily. PRN   No facility-administered encounter medications on file as of 10/30/2023.    Allergies (verified) Covid-19 mrna vacc (moderna)   History: Past Medical History:  Diagnosis Date   ABNORMAL EKG    Anemia    Breast cancer (HCC)    Fatigue    Mitral valve disorders(424.0)    Vitamin D  deficiency    Past Surgical History:  Procedure Laterality Date   APPENDECTOMY  1966   MASTECTOMY     MITRAL VALVE REPLACEMENT  05/29/2021   RIGHT/LEFT HEART CATH AND CORONARY ANGIOGRAPHY N/A 05/05/2021   Procedure: RIGHT/LEFT HEART CATH AND CORONARY ANGIOGRAPHY;  Surgeon: Arnoldo Lapping, MD;  Location: [facility] INVASIVE CV LAB;  Service: Cardiovascular;  Laterality: N/A;   TEE WITHOUT CARDIOVERSION N/A 02/09/2021  Procedure: TRANSESOPHAGEAL ECHOCARDIOGRAM (TEE);  Surgeon: Luana Rumple, MD;  Location: [facility] ENDOSCOPY;  Service: Cardiovascular;  Laterality: N/A;   VARICOSE VEIN SURGERY Right 1975   Family History  Problem Relation Age of Onset   Atrial fibrillation Mother    Stroke Mother    Emphysema Father    Dementia Father    Heart disease Sister        CHF   Hypertension Other    Stroke Other    Cancer Other    Healthy Son    Healthy Son    Social History   Socioeconomic History   Marital status: Single    Spouse name: Not on file   Number of children: Not on file   Years of education: Not on  file   Highest education level: Master's degree (e.g., MA, MS, MEng, MEd, MSW, MBA)  Occupational History   Not on file  Tobacco Use   Smoking status: Former    Current packs/day: 0.00    Types: Cigarettes    Quit date: 05/21/1972    Years since quitting: 51.4    Passive exposure: Current   Smokeless tobacco: Never  Vaping Use   Vaping status: Never Used  Substance and Sexual Activity   Alcohol use: Yes    Alcohol/week: 3.0 standard drinks of alcohol    Types: 3 Glasses of wine per week    Comment: occ   Drug use: No   Sexual activity: Not Currently  Other Topics Concern   Not on file  Social History Narrative   Not on file   Social Drivers of Health   Financial Resource Strain: Low Risk  (10/30/2023)   Overall Financial Resource Strain (CARDIA)    Difficulty of Paying Living Expenses: Not very hard  Food Insecurity: No Food Insecurity (10/30/2023)   Hunger Vital Sign    Worried About Running Out of Food in the Last Year: Never true    Ran Out of Food in the Last Year: Never true  Transportation Needs: No Transportation Needs (10/30/2023)   PRAPARE - Administrator, Civil Service (Medical): No    Lack of Transportation (Non-Medical): No  Physical Activity: Sufficiently Active (10/30/2023)   Exercise Vital Sign    Days of Exercise per Week: 7 days    Minutes of Exercise per Session: 90 min  Stress: No Stress Concern Present (10/30/2023)   [employer] of Occupational Health - Occupational Stress Questionnaire    Feeling of Stress : Not at all  Social Connections: Moderately Integrated (10/30/2023)   Social Connection and Isolation Panel [NHANES]    Frequency of Communication with Friends and Family: More than three times a week    Frequency of Social Gatherings with Friends and Family: More than three times a week    Attends Religious Services: 1 to 4 times per year    Active Member of [employer] or Organizations: Yes    Attends Engineer, structural:  More than 4 times per year    Marital Status: Divorced    Tobacco Counseling Counseling given: Not Answered    Clinical Intake:  Pre-visit preparation completed: Yes  Pain : No/denies pain     BMI - recorded: 16.72 Nutritional Status: BMI <19  Underweight Nutritional Risks: None Diabetes: No  Lab Results  Component Value Date   HGBA1C 5.1 01/29/2022   HGBA1C 5.4 09/12/2021   HGBA1C 4.8 01/26/2021     How often do you need to have someone help you when  you read instructions, pamphlets, or other written materials from your doctor or pharmacy?: 1 - Never  Interpreter Needed?: No  Information entered by :: Kandy Orris, CMA   Activities of Daily Living     10/30/2023    2:40 PM 10/26/2023   12:23 PM  In your present state of health, do you have any difficulty performing the following activities:  Hearing? 1 1  Vision? 0 0  Difficulty concentrating or making decisions? 0 0  Walking or climbing stairs? 1 1  Dressing or bathing? 0 0  Doing errands, shopping? 0 0  Preparing Food and eating ? N N  Using the Toilet? N N  In the past six months, have you accidently leaked urine? N N  Do you have problems with loss of bowel control? N N  Managing your Medications? N N  Managing your Finances? N N  Housekeeping or managing your Housekeeping? N N    Patient Care Team: Arcadio Knuckles, MD as PCP - General (Internal Medicine) Sheryle Donning, MD as PCP - Cardiology (Cardiology) Ronni Colace, MD as Consulting Physician (Ophthalmology) Serafin Dames, MD as Consulting Physician (Gastroenterology) Eduardo Grade, MD as Consulting Physician (Dermatology) Shirlee Dotter, MD (Inactive) as Consulting Physician (Orthopedic Surgery) Thomos Flies, MD as Consulting Physician (Rheumatology) Alethea Andes, DDS as Consulting Physician (Dentistry) Darlis Eisenmenger, MD as Consulting Physician (Cardiology) [facility] as Consulting Physician  (Optometry) Thurmon [place]., MD (Ophthalmology)  I have updated your Care Teams any recent Medical Services you may have received from other providers in the past year.     Assessment:    This is a routine wellness examination for Helen Davis.  Hearing/Vision screen Hearing Screening - Comments:: Wears hearing aids Vision Screening - Comments:: Wears rx glasses - up to date with routine eye exams with Dr Rachel Budds    Goals Addressed               This Visit's Progress     Patient Stated (pt-stated)        Patient stated she plans to continue walking at least 2 miles       Depression Screen     10/30/2023    2:41 PM 09/04/2023    3:28 PM 10/17/2022    3:27 PM 09/12/2021    2:47 PM 08/03/2020   11:29 PM 09/09/2019    4:27 PM 08/27/2018    2:26 PM  PHQ 2/9 Scores  PHQ - 2 Score 3 0 0 0 0 0 0  PHQ- 9 Score 4 2 0        Fall Risk     10/30/2023    2:40 PM 10/26/2023   12:23 PM 09/04/2023    3:28 PM 10/17/2022    3:30 PM 10/13/2022    6:50 PM  Fall Risk   Falls in the past year? 0 0 0 0 0  Number falls in past yr: 0  0 0   Injury with Fall? 0  0 0   Risk for fall due to : No Fall Risks  No Fall Risks No Fall Risks   Follow up Falls evaluation completed;Falls prevention discussed  Falls evaluation completed Falls prevention discussed     MEDICARE RISK AT HOME:  Medicare Risk at Home Any stairs in or around the home?: Yes If so, are there any without handrails?: No Home free of loose throw rugs in walkways, pet beds, electrical cords, etc?: Yes Adequate lighting in your home to reduce risk of  falls?: Yes Life alert?: No Use of a cane, walker or w/c?: No Grab bars in the bathroom?: Yes Shower chair or bench in shower?: No Elevated toilet seat or a handicapped toilet?: No  TIMED UP AND GO:  Was the test performed?  No  Cognitive Function: 6CIT completed        10/30/2023    2:43 PM 10/17/2022    3:26 PM  6CIT Screen  What Year? 0 points 0 points  What month? 0 points 0  points  What time? 0 points 0 points  Count back from 20 0 points 0 points  Months in reverse 0 points 0 points  Repeat phrase 0 points 0 points  Total Score 0 points 0 points    Immunizations Immunization History  Administered Date(s) Administered   Pneumococcal Conjugate-13 02/12/2018   Pneumococcal Polysaccharide-23 02/16/2019   Td 09/12/2021   Tdap 09/18/2009    Screening Tests Health Maintenance  Topic Date Due   Zoster Vaccines- Shingrix (1 of 2) Never done   Medicare Annual Wellness (AWV)  10/29/2024   DTaP/Tdap/Td (3 - Td or Tdap) 09/13/2031   Pneumonia Vaccine 71+ Years old  Completed   DEXA SCAN  Completed   HPV VACCINES  Aged Out   Meningococcal B Vaccine  Aged Out   INFLUENZA VACCINE  Discontinued   COVID-19 Vaccine  Discontinued    Health Maintenance  Health Maintenance Due  Topic Date Due   Zoster Vaccines- Shingrix (1 of 2) Never done   Health Maintenance Items Addressed: 10/30/2023   Additional Screening:  Vision Screening: Recommended annual ophthalmology exams for early detection of glaucoma and other disorders of the eye.     Dental Screening: Recommended annual dental exams for proper oral hygiene  Community Resource Referral / Chronic Care Management: CRR required this visit?  No   CCM required this visit?  No   Plan:    I have personally reviewed and noted the following in the patient's chart:   Medical and social history Use of alcohol, tobacco or illicit drugs  Current medications and supplements including opioid prescriptions. Patient is not currently taking opioid prescriptions. Functional ability and status Nutritional status Physical activity Advanced directives List of other physicians Hospitalizations, surgeries, and ER visits in previous 12 months Vitals Screenings to include cognitive, depression, and falls Referrals and appointments  In addition, I have reviewed and discussed with patient certain preventive  protocols, quality metrics, and best practice recommendations. A written personalized care plan for preventive services as well as general preventive health recommendations were provided to patient.   Patria Bookbinder, CMA   10/30/2023   After Visit Summary: (In Person-Declined) Patient declined AVS at this time.  Notes: Nothing significant to report at this time.

## 2023-11-29 ENCOUNTER — Other Ambulatory Visit (HOSPITAL_BASED_OUTPATIENT_CLINIC_OR_DEPARTMENT_OTHER): Payer: Self-pay | Admitting: Cardiology

## 2023-11-29 DIAGNOSIS — I4819 Other persistent atrial fibrillation: Secondary | ICD-10-CM

## 2023-11-29 NOTE — Telephone Encounter (Signed)
 Prescription refill request for Eliquis  received. Indication: Afib  Last office visit: 03/07/23 Ananias)  Scr: 0.72 (09/04/23)  Age: 83 Weight: 42.8kg  Appropriate dose. Refill sent.

## 2023-12-09 ENCOUNTER — Inpatient Hospital Stay: Payer: Medicare Other | Attending: Oncology | Admitting: Oncology

## 2023-12-09 ENCOUNTER — Inpatient Hospital Stay: Payer: Self-pay

## 2023-12-09 ENCOUNTER — Other Ambulatory Visit: Payer: Medicare Other

## 2023-12-09 VITALS — BP 100/68 | HR 61 | Temp 98.2°F | Resp 18 | Ht 63.0 in | Wt 93.6 lb

## 2023-12-09 DIAGNOSIS — D472 Monoclonal gammopathy: Secondary | ICD-10-CM

## 2023-12-09 DIAGNOSIS — Z853 Personal history of malignant neoplasm of breast: Secondary | ICD-10-CM | POA: Insufficient documentation

## 2023-12-09 DIAGNOSIS — I4891 Unspecified atrial fibrillation: Secondary | ICD-10-CM | POA: Insufficient documentation

## 2023-12-09 DIAGNOSIS — Z952 Presence of prosthetic heart valve: Secondary | ICD-10-CM | POA: Insufficient documentation

## 2023-12-09 DIAGNOSIS — Z9013 Acquired absence of bilateral breasts and nipples: Secondary | ICD-10-CM | POA: Insufficient documentation

## 2023-12-09 DIAGNOSIS — J449 Chronic obstructive pulmonary disease, unspecified: Secondary | ICD-10-CM | POA: Insufficient documentation

## 2023-12-09 DIAGNOSIS — M810 Age-related osteoporosis without current pathological fracture: Secondary | ICD-10-CM | POA: Insufficient documentation

## 2023-12-09 LAB — CMP (CANCER CENTER ONLY)
ALT: 16 U/L (ref 0–44)
AST: 23 U/L (ref 15–41)
Albumin: 4.2 g/dL (ref 3.5–5.0)
Alkaline Phosphatase: 54 U/L (ref 38–126)
Anion gap: 11 (ref 5–15)
BUN: 21 mg/dL (ref 8–23)
CO2: 25 mmol/L (ref 22–32)
Calcium: 9.4 mg/dL (ref 8.9–10.3)
Chloride: 97 mmol/L — ABNORMAL LOW (ref 98–111)
Creatinine: 0.86 mg/dL (ref 0.44–1.00)
GFR, Estimated: >60 mL/min (ref >60)
Glucose, Bld: 98 mg/dL (ref 70–99)
Potassium: 4.6 mmol/L (ref 3.5–5.1)
Sodium: 132 mmol/L — ABNORMAL LOW (ref 135–145)
Total Bilirubin: 0.6 mg/dL (ref 0.0–1.2)
Total Protein: 6.5 g/dL (ref 6.5–8.1)

## 2023-12-09 LAB — CBC WITH DIFFERENTIAL (CANCER CENTER ONLY)
Abs Immature Granulocytes: 0.02 K/uL (ref 0.00–0.07)
Basophils Absolute: 0.1 K/uL (ref 0.0–0.1)
Basophils Relative: 1 %
Eosinophils Absolute: 0 K/uL (ref 0.0–0.5)
Eosinophils Relative: 1 %
HCT: 34.1 % — ABNORMAL LOW (ref 36.0–46.0)
Hemoglobin: 11.4 g/dL — ABNORMAL LOW (ref 12.0–15.0)
Immature Granulocytes: 0 %
Lymphocytes Relative: 21 %
Lymphs Abs: 1.2 K/uL (ref 0.7–4.0)
MCH: 32.1 pg (ref 26.0–34.0)
MCHC: 33.4 g/dL (ref 30.0–36.0)
MCV: 96.1 fL (ref 80.0–100.0)
Monocytes Absolute: 0.8 K/uL (ref 0.1–1.0)
Monocytes Relative: 13 %
Neutro Abs: 3.8 K/uL (ref 1.7–7.7)
Neutrophils Relative %: 64 %
Platelet Count: 240 K/uL (ref 150–400)
RBC: 3.55 MIL/uL — ABNORMAL LOW (ref 3.87–5.11)
RDW: 12.8 % (ref 11.5–15.5)
WBC Count: 5.9 K/uL (ref 4.0–10.5)
nRBC: 0 % (ref 0.0–0.2)

## 2023-12-09 NOTE — Progress Notes (Signed)
   [facility] OFFICE PROGRESS NOTE   Diagnosis: Serum monoclonal proteins  INTERVAL HISTORY:   [name] returns as scheduled.  She feels well.  No recent infection.  She has multiple dark moles over the trunk.  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  She relates this to receiving Taxol in the remote past.  She walks several miles per day.  Objective:  Vital signs in last 24 hours:  Blood pressure 100/68, pulse 61, temperature 98.2 F (36.8 C), temperature source Temporal, resp. rate 18, height 5' 3 (1.6 m), weight 93 lb 9.6 oz (42.5 kg), SpO2 100%.    Lymphatics: No cervical, supraclavicular, or axillary nodes.  Pea-sized left inguinal node. Resp: Scattered fine end inspiratory rales bilaterally, no respiratory distress Cardio: Regular rate and rhythm GI: No hepatosplenomegaly Vascular: No leg edema Breast: Bilateral mastectomy.  No evidence for chest wall tumor recurrence Skin: Multiple benign-appearing keratoses over the trunk and face, multiple cherry moles over the trunk  Lab Results:  Lab Results  Component Value Date   WBC 5.9 12/09/2023   HGB 11.4 (L) 12/09/2023   HCT 34.1 (L) 12/09/2023   MCV 96.1 12/09/2023   PLT 240 12/09/2023   NEUTROABS 3.8 12/09/2023    CMP  Lab Results  Component Value Date   NA 132 (L) 12/09/2023   K 4.6 12/09/2023   CL 97 (L) 12/09/2023   CO2 25 12/09/2023   GLUCOSE 98 12/09/2023   BUN 21 12/09/2023   CREATININE 0.86 12/09/2023   CALCIUM 9.4 12/09/2023   PROT 6.5 12/09/2023   ALBUMIN 4.2 12/09/2023   AST 23 12/09/2023   ALT 16 12/09/2023   ALKPHOS 54 12/09/2023   BILITOT 0.6 12/09/2023   GFRNONAA >60 12/09/2023   GFRAA 99 08/04/2020    No results found for: CEA1, CEA, CAN199, CA125  No results found for: INR, LABPROT  Imaging:  No results found.  Medications: I have reviewed the patient's current medications.   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  There is no clinical or laboratory evidence for progression to multiple myeloma or another lymphoproliferative disorder.  We will follow-up on the myeloma panel from today.  The serum M spike and IgM level had not changed significantly when she was here in November 2024. She would like to space out follow-up in the oncology clinic.  She will return for an office and lab visit in June 2026.  I recommended she remain up-to-date on influenza and pneumonia vaccines.  I recommend a pneumococcal 20 or 21 vaccine.  She will discuss the pneumonia vaccine with Dr. Joshua. Arley Hof, MD  12/09/2023  3:19 PM

## 2023-12-10 ENCOUNTER — Encounter (HOSPITAL_BASED_OUTPATIENT_CLINIC_OR_DEPARTMENT_OTHER): Payer: Self-pay

## 2023-12-10 LAB — KAPPA/LAMBDA LIGHT CHAINS
Kappa free light chain: 47.3 mg/L — ABNORMAL HIGH (ref 3.3–19.4)
Kappa, lambda light chain ratio: 2.41 — ABNORMAL HIGH (ref 0.26–1.65)
Lambda free light chains: 19.6 mg/L (ref 5.7–26.3)

## 2023-12-11 LAB — MULTIPLE MYELOMA PANEL, SERUM
Albumin SerPl Elph-Mcnc: 3.8 g/dL (ref 2.9–4.4)
Albumin/Glob SerPl: 1.7 (ref 0.7–1.7)
Alpha 1: 0.1 g/dL (ref 0.0–0.4)
Alpha2 Glob SerPl Elph-Mcnc: 0.5 g/dL (ref 0.4–1.0)
B-Globulin SerPl Elph-Mcnc: 0.6 g/dL — ABNORMAL LOW (ref 0.7–1.3)
Gamma Glob SerPl Elph-Mcnc: 1 g/dL (ref 0.4–1.8)
Globulin, Total: 2.3 g/dL (ref 2.2–3.9)
IgA: 21 mg/dL — ABNORMAL LOW (ref 64–422)
IgG (Immunoglobin G), Serum: 407 mg/dL — ABNORMAL LOW (ref 586–1602)
IgM (Immunoglobulin M), Srm: 1058 mg/dL — ABNORMAL HIGH (ref 26–217)
M Protein SerPl Elph-Mcnc: 0.4 g/dL — ABNORMAL HIGH
Total Protein ELP: 6.1 g/dL (ref 6.0–8.5)

## 2023-12-12 ENCOUNTER — Ambulatory Visit: Payer: Self-pay | Admitting: Oncology

## 2024-01-24 ENCOUNTER — Encounter (HOSPITAL_BASED_OUTPATIENT_CLINIC_OR_DEPARTMENT_OTHER): Payer: Self-pay | Admitting: Cardiology

## 2024-01-24 ENCOUNTER — Ambulatory Visit (HOSPITAL_BASED_OUTPATIENT_CLINIC_OR_DEPARTMENT_OTHER): Admitting: Cardiology

## 2024-01-24 VITALS — BP 106/58 | HR 59 | Resp 18 | Ht 63.0 in | Wt 93.0 lb

## 2024-01-24 DIAGNOSIS — Z7901 Long term (current) use of anticoagulants: Secondary | ICD-10-CM | POA: Diagnosis not present

## 2024-01-24 DIAGNOSIS — I480 Paroxysmal atrial fibrillation: Secondary | ICD-10-CM | POA: Diagnosis not present

## 2024-01-24 DIAGNOSIS — Z952 Presence of prosthetic heart valve: Secondary | ICD-10-CM | POA: Diagnosis not present

## 2024-01-24 DIAGNOSIS — D6859 Other primary thrombophilia: Secondary | ICD-10-CM | POA: Diagnosis not present

## 2024-01-24 NOTE — Patient Instructions (Signed)
 Medication Instructions:  Your physician recommends that you continue on your current medications as directed. Please refer to the Current Medication list given to you today.  *If you need a refill on your cardiac medications before your next appointment, please call your pharmacy*   Follow-Up: At [facility], you and your health needs are our priority.  As part of our continuing mission to provide you with exceptional heart care, our providers are all part of one team.  This team includes your primary Cardiologist (physician) and Advanced Practice Providers or APPs (Physician Assistants and Nurse Practitioners) who all work together to provide you with the care you need, when you need it.  Your next appointment:   1 year  Provider:   Shelda Bruckner, MD

## 2024-01-24 NOTE — Progress Notes (Signed)
 Cardiology Office Note:  .    Date:  01/24/2024  ID:  KASH MOTHERSHEAD, DOB Aug 18, 1940, MRN [id number] PCP: Joshua Debby CROME, MD  [facility] Providers Cardiologist:  Shelda Bruckner, MD     History of Present Illness: .    Helen Davis is a 83 y.o. female with a hx of mitral regurgitation s/p bioprosthetic MVR at [facility] 05/2021, remote breast cancer who is seen for follow up today. I initially met her 02/15/20 as a new consult at the request of Tonita Fallow, MD for the evaluation and management of mitral regurgitation.   Pertinent history: -mitral valve replacement (31mm Carpentier-Edwards Mitris bioprosthetic valve) with Dr. Ricky at [facility] 05/29/21. Had heartport incision, healing well. Found to be in new afib at her follow up visit. -CT imaging at [facility] noted A 2.4 x 1.6 cm subsolid spiculated nodule in the anterior right upper lobe. No tracheobronchial tree abnormalities. No pleural effusion.  -bilateral mastectomy for breast cancer in [place]  in 1999, had lumpectomy and 4 mos of chemo initially but then had bilateral mastectomy when another mass found, underwent additional chemo. Remotely seen by Dr. Layla, has been ~20 years since follow up.   Today: Doing well overall. Walks 2-2.5 miles at a time. Can walk hills as well. No significant shortness of breath unless walking uphill at a quick pace.  Hasn't felt any sustained rhythm abnormalities. Feels rare quick flutter.   Still has occasional nonlimiting dizziness. Changes position slowly.   ROS:  Denies chest pain, shortness of breath at rest or with normal exertion. No PND, orthopnea, LE edema or unexpected weight gain. No syncope. ROS otherwise negative except as noted.   Studies Reviewed: SABRA    EKG Interpretation Date/Time:  Friday January 24 2024 15:37:05 EDT Ventricular Rate:  58 PR Interval:  168 QRS Duration:  86 QT Interval:  420 QTC Calculation: 412 R Axis:   10  Text Interpretation: Sinus  bradycardia Possible Left atrial enlargement Left ventricular hypertrophy with repolarization abnormality Confirmed by Bruckner Shelda [id number]) on 01/24/2024 4:04:01 PM     Physical Exam:    VS:  BP (!) 106/58 (BP Location: Left Arm, Patient Position: Sitting, Cuff Size: Normal)   Pulse (!) 59   Resp 18   Ht 5' 3 (1.6 m)   Wt 93 lb (42.2 kg)   LMP  (LMP Unknown)   SpO2 96%   BMI 16.47 kg/m    Wt Readings from Last 3 Encounters:  01/24/24 93 lb (42.2 kg)  12/09/23 93 lb 9.6 oz (42.5 kg)  10/30/23 94 lb 6.4 oz (42.8 kg)    GEN: Well nourished, well developed in no acute distress HEENT: Normal, moist mucous membranes NECK: No JVD CARDIAC: regular rhythm, normal S1 and S2, no rubs or gallops. No murmur. VASCULAR: Radial and DP pulses 2+ bilaterally. No carotid bruits RESPIRATORY:  Clear to auscultation without rales, wheezing or rhonchi  ABDOMEN: Soft, non-tender, non-distended MUSCULOSKELETAL:  Ambulates independently SKIN: Warm and dry, no edema NEUROLOGIC:  Alert and oriented x 3. No focal neuro deficits noted. PSYCHIATRIC:  Normal affect   ASSESSMENT AND PLAN: .    Severe mitral regurgitation S/P Bioprosthetic MVR (31mm Carpentier-Edwards Mitris) by Dr. Ricky at [facility] 05/29/21 Postoperative atrial fibrillation with RVR -CHA2DS2/VAS Stroke Risk Points= 4  -continue apixaban , see shared decision making from prior note 11/2021. She is now on reduced dose due to age and weight. Bruises but no significant bleeding -aspirin  81 mg was chronic due to valve,  but stopped due to excessive bruising -prophylactic antibiotics before dental cleanings. -continue low dose metoprolol    Pulmonary nodule found on CT scan History of breast cancer s/p bilateral mastectomy and chemo -spiculated nodule seen on CT, has been stable. Follows with Dr. Shelah.   Cardiac risk counseling and prevention recommendations: -recommend heart healthy/Mediterranean diet, with whole grains, fruits, vegetable,  fish, lean meats, nuts, and olive oil. Limit salt. -recommend moderate walking, 3-5 times/week for 30-50 minutes each session. Aim for at least 150 minutes.week. Goal should be pace of 3 miles/hours, or walking 1.5 miles in 30 minutes -recommend avoidance of tobacco products. Avoid excess alcohol.   Dispo: Follow-up in 12 months, or sooner as needed.  Signed, Shelda Bruckner, MD

## 2024-02-24 ENCOUNTER — Ambulatory Visit: Admitting: Internal Medicine

## 2024-02-26 ENCOUNTER — Telehealth: Payer: Self-pay

## 2024-02-26 NOTE — Telephone Encounter (Signed)
 Copied from CRM [id number]. Topic: Clinical - Request for Lab/Test Order >> Feb 26, 2024  4:14 PM Helen Davis wrote: Reason for CRM: pt called because she needs a prolia  injection. Please call and advise when approved.

## 2024-02-27 ENCOUNTER — Encounter: Payer: Self-pay | Admitting: Internal Medicine

## 2024-02-27 ENCOUNTER — Other Ambulatory Visit: Payer: Self-pay

## 2024-02-27 ENCOUNTER — Telehealth: Payer: Self-pay

## 2024-02-27 DIAGNOSIS — M810 Age-related osteoporosis without current pathological fracture: Secondary | ICD-10-CM

## 2024-02-27 MED ORDER — DENOSUMAB 60 MG/ML ~~LOC~~ SOSY
60.0000 mg | PREFILLED_SYRINGE | Freq: Once | SUBCUTANEOUS | Status: AC
  Administered 2024-03-05: 60 mg via SUBCUTANEOUS

## 2024-02-27 NOTE — Telephone Encounter (Addendum)
 PHARMACY BENEFIT: $169.66

## 2024-02-27 NOTE — Telephone Encounter (Signed)
 Who do I reach out to about this? Luke I don't see a new PA started.

## 2024-02-27 NOTE — Telephone Encounter (Signed)
 Prolia VOB initiated via [email or web address]  Next Prolia inj DUE: NOW

## 2024-02-28 NOTE — Telephone Encounter (Signed)
 Patient walked into office to schedule her Prolia . Orders in but need updated PA as patient was due on 9/6 not 11/8.   Patient scheduled for 03/05/24 @ 3:20p, while waiting on updated PA.

## 2024-03-02 ENCOUNTER — Other Ambulatory Visit (HOSPITAL_COMMUNITY): Payer: Self-pay

## 2024-03-02 NOTE — Telephone Encounter (Unsigned)
 Copied from CRM [id number]. Topic: Clinical - Medication Prior Auth >> Mar 02, 2024 12:01 PM Charolett L wrote: Reason for CRM: [employer] stated that the Prolia  injection request has been approved on 03/02/24 til 03/02/25 Auth # [id number]

## 2024-03-02 NOTE — Telephone Encounter (Signed)
 SABRA

## 2024-03-02 NOTE — Telephone Encounter (Signed)
 Pt ready for scheduling for PROLIA  on or after : 03/02/24  Option# 1: Buy/Bill (Office supplied medication)  Out-of-pocket cost due at time of clinic visit: $332  Number of injection/visits approved: 2  Primary: [employer] Prolia  co-insurance: 20% Admin fee co-insurance: 0%  Secondary: --- Prolia  co-insurance:  Admin fee co-insurance:   Medical Benefit Details: Date Benefits were checked: 02/28/24 Deductible: NO/ Coinsurance: 20%/ Admin Fee: 0%  Prior Auth: APPROVED PA# [id number] Expiration Date: 03/02/24-03/02/25  # of doses approved: 2 ----------------------------------------------------------------------- Option# 2- Med Obtained from pharmacy:  Pharmacy benefit: Copay $169.66 (Paid to pharmacy) Admin Fee: 0% (Pay at clinic)  Prior Auth: APPROVED PA# Expiration Date: 06/25/23-06/24/24  # of doses approved: 2   If patient wants fill through the pharmacy benefit please send prescription to: [facility], and include estimated need by date in rx notes. Pharmacy will ship medication directly to the office.  Patient NOT eligible for Prolia  Copay Card. Copay Card can make patient's cost as little as $25. Link to apply: [email or web address]://[email or web address].[email or web address]/copay  ** This summary of benefits is an estimation of the patient's out-of-pocket cost. Exact cost may very based on individual plan coverage.

## 2024-03-02 NOTE — Telephone Encounter (Signed)
 MEDICAL PA SUBMITTED VIA LATENT. KEY: [id number]

## 2024-03-04 NOTE — Telephone Encounter (Signed)
 Okay to give, call patient to get Prolia  inject, if wants to get it via Pharmacy benefit, we can order and she pay them $169.66 and $0 admin fee verses $332 for clinic supplied.

## 2024-03-04 NOTE — Telephone Encounter (Signed)
 Unable to reach patient. LMTRC

## 2024-03-05 ENCOUNTER — Ambulatory Visit (INDEPENDENT_AMBULATORY_CARE_PROVIDER_SITE_OTHER)

## 2024-03-05 DIAGNOSIS — M810 Age-related osteoporosis without current pathological fracture: Secondary | ICD-10-CM

## 2024-03-05 NOTE — Progress Notes (Signed)
 Pt received her prolia  injection today with out any complications

## 2024-03-06 NOTE — Telephone Encounter (Signed)
 Patient has already had her injection. She paid the out of pocket cost.

## 2024-03-23 ENCOUNTER — Encounter: Payer: Self-pay | Admitting: Radiology

## 2024-03-28 ENCOUNTER — Other Ambulatory Visit (HOSPITAL_BASED_OUTPATIENT_CLINIC_OR_DEPARTMENT_OTHER): Payer: Self-pay | Admitting: Cardiology

## 2024-03-28 DIAGNOSIS — I4819 Other persistent atrial fibrillation: Secondary | ICD-10-CM

## 2024-03-30 NOTE — Telephone Encounter (Signed)
 Eliquis  2.5mg  refill request received. Patient is 83 years old, weight-42.2kg, Crea-0.86 on 12/09/23, Diagnosis-Afib, and last seen by Dr. Lonni on 01/24/24. Dose is appropriate based on dosing criteria. Will send in refill to requested pharmacy.

## 2024-04-07 ENCOUNTER — Ambulatory Visit: Admitting: Internal Medicine

## 2024-04-07 ENCOUNTER — Encounter: Payer: Self-pay | Admitting: Internal Medicine

## 2024-04-07 VITALS — BP 132/80 | HR 65 | Temp 98.4°F | Resp 16 | Ht 63.0 in | Wt 96.2 lb

## 2024-04-07 DIAGNOSIS — Z0001 Encounter for general adult medical examination with abnormal findings: Secondary | ICD-10-CM

## 2024-04-07 DIAGNOSIS — Z0000 Encounter for general adult medical examination without abnormal findings: Secondary | ICD-10-CM

## 2024-04-07 DIAGNOSIS — M810 Age-related osteoporosis without current pathological fracture: Secondary | ICD-10-CM

## 2024-04-07 DIAGNOSIS — D538 Other specified nutritional anemias: Secondary | ICD-10-CM

## 2024-04-07 DIAGNOSIS — D539 Nutritional anemia, unspecified: Secondary | ICD-10-CM

## 2024-04-07 DIAGNOSIS — I480 Paroxysmal atrial fibrillation: Secondary | ICD-10-CM

## 2024-04-07 DIAGNOSIS — E782 Mixed hyperlipidemia: Secondary | ICD-10-CM

## 2024-04-07 LAB — LIPID PANEL
Cholesterol: 188 mg/dL (ref 0–200)
HDL: 101.2 mg/dL (ref >39.00)
LDL Cholesterol: 66 mg/dL (ref 0–99)
NonHDL: 87.11
Total CHOL/HDL Ratio: 2
Triglycerides: 105 mg/dL (ref 0.0–149.0)
VLDL: 21 mg/dL (ref 0.0–40.0)

## 2024-04-07 LAB — CBC WITH DIFFERENTIAL/PLATELET
Basophils Absolute: 0.1 K/uL (ref 0.0–0.1)
Basophils Relative: 0.9 % (ref 0.0–3.0)
Eosinophils Absolute: 0 K/uL (ref 0.0–0.7)
Eosinophils Relative: 0.6 % (ref 0.0–5.0)
HCT: 34.4 % — ABNORMAL LOW (ref 36.0–46.0)
Hemoglobin: 11.7 g/dL — ABNORMAL LOW (ref 12.0–15.0)
Lymphocytes Relative: 20 % (ref 12.0–46.0)
Lymphs Abs: 1.2 K/uL (ref 0.7–4.0)
MCHC: 34.1 g/dL (ref 30.0–36.0)
MCV: 95.4 fl (ref 78.0–100.0)
Monocytes Absolute: 0.7 K/uL (ref 0.1–1.0)
Monocytes Relative: 11.5 % (ref 3.0–12.0)
Neutro Abs: 4.1 K/uL (ref 1.4–7.7)
Neutrophils Relative %: 67 % (ref 43.0–77.0)
Platelets: 242 K/uL (ref 150.0–400.0)
RBC: 3.61 Mil/uL — ABNORMAL LOW (ref 3.87–5.11)
RDW: 12.7 % (ref 11.5–15.5)
WBC: 6.1 K/uL (ref 4.0–10.5)

## 2024-04-07 LAB — BASIC METABOLIC PANEL WITH GFR
BUN: 21 mg/dL (ref 6–23)
CO2: 29 meq/L (ref 19–32)
Calcium: 9.7 mg/dL (ref 8.4–10.5)
Chloride: 97 meq/L (ref 96–112)
Creatinine, Ser: 0.69 mg/dL (ref 0.40–1.20)
GFR: 81.55 mL/min (ref >60.00)
Glucose, Bld: 92 mg/dL (ref 70–99)
Potassium: 4.1 meq/L (ref 3.5–5.1)
Sodium: 134 meq/L — ABNORMAL LOW (ref 135–145)

## 2024-04-07 LAB — IBC + FERRITIN
Ferritin: 14.4 ng/mL (ref 10.0–291.0)
Iron: 103 ug/dL (ref 42–145)
Saturation Ratios: 26.7 % (ref 20.0–50.0)
TIBC: 386.4 ug/dL (ref 250.0–450.0)
Transferrin: 276 mg/dL (ref 212.0–360.0)

## 2024-04-07 LAB — TSH: TSH: 1.37 u[IU]/mL (ref 0.35–5.50)

## 2024-04-07 LAB — FOLATE: Folate: >23.7 ng/mL (ref >5.9)

## 2024-04-07 LAB — VITAMIN D 25 HYDROXY (VIT D DEFICIENCY, FRACTURES): VITD: 61.92 ng/mL (ref 30.00–100.00)

## 2024-04-07 LAB — PHOSPHORUS: Phosphorus: 2.7 mg/dL (ref 2.3–4.6)

## 2024-04-07 LAB — VITAMIN B12: Vitamin B-12: 1130 pg/mL — ABNORMAL HIGH (ref 211–911)

## 2024-04-07 NOTE — Patient Instructions (Signed)

## 2024-04-07 NOTE — Progress Notes (Unsigned)
 Subjective:  Patient ID: Helen Davis, female    DOB: [birth year]  Age: 83 y.o. MRN: [id number]  CC: Annual Exam and Anemia   HPI Helen Davis presents for a CPX and f/up -----  Discussed the use of AI scribe software for clinical note transcription with the patient, who gave verbal consent to proceed.  History of Present Illness Helen Davis is an 83 year old female with anemia and atrial fibrillation who presents for a routine follow-up.  She maintains an active lifestyle, walking daily in [place] [employer] [place], although she has reduced her distance from four miles to two and a half miles. No chest pain, shortness of breath, dizziness, or lightheadedness beyond normal. Occasional dizziness resolves on its own, with no falls or episodes of syncope. She experiences swelling in her right leg if she stands for too long, but walking alleviates this. Walking also helps with her lower back pain.  She experiences frequent bruising and bleeding. She notes cognitive changes, stating she has to think more about what she is saying and can only focus on one thing at a time now, unlike in the past when she could multitask effectively.  She takes vitamin supplements, including Citracal, and has stopped taking vitamin D  due to previously high levels. She is currently on Prolia  and takes magnesium, which she believes helps with bowel regularity. No abdominal pain, constipation, or diarrhea.  She experiences weakness or fatigue if she stays up too late but does not feel weak otherwise. She has a history of anemia since undergoing chemotherapy following a mastectomy in 1999-2000. She is on a blood thinner for atrial fibrillation, which was detected after a mitral valve replacement at [facility].  She reports feeling full quickly and avoids overeating to prevent discomfort. She is comfortable with her current weight of approximately 97 pounds.  She has not received a flu vaccine and had issues with the  COVID vaccine. She received a pneumonia vaccine five years ago.     Outpatient Medications Prior to Visit  Medication Sig Dispense Refill   apixaban  (ELIQUIS ) 2.5 MG TABS tablet TAKE 1 TABLET BY MOUTH 2 TIMES A DAY 180 tablet 1   Bioflavonoid Products (ESTER-C) 500-550 MG TABS Take 500-1,000 mg by mouth daily.     Cyanocobalamin  (VITAMIN B 12 PO) Take 1,000 mcg by mouth daily. Timed release     MAGNESIUM PO Take 500 mg by mouth at bedtime.     metoprolol  tartrate (LOPRESSOR ) 25 MG tablet TAKE A HALF TABLET BY MOUTH TWICE A DAY 90 tablet 1   Multiple Vitamins-Minerals (CENTRUM SILVER PO) Take 1 tablet by mouth daily. With iron     Zinc  30 MG TABS Take 30 mg by mouth daily. PRN (Patient taking differently: Take 30 mg by mouth every other day. PRN)     Acetaminophen  (TYLENOL  EXTRA STRENGTH PO) Take by mouth. (Patient not taking: Reported on 04/07/2024)     amoxicillin  (AMOXIL ) 500 MG tablet Take 4 tablets 1 hour prior to dental work. (Patient not taking: Reported on 04/07/2024) 4 tablet 1   No facility-administered medications prior to visit.    ROS Review of Systems  Objective:  BP 132/80 (BP Location: Left Arm, Patient Position: Sitting)   Pulse 65   Temp 98.4 F (36.9 C) (Temporal)   Resp 16   Ht 5' 3 (1.6 m)   Wt 96 lb 3.2 oz (43.6 kg)   LMP  (LMP Unknown)   SpO2 97%   BMI 17.04  kg/m   BP Readings from Last 3 Encounters:  04/07/24 132/80  01/24/24 (!) 106/58  12/09/23 100/68    Wt Readings from Last 3 Encounters:  04/07/24 96 lb 3.2 oz (43.6 kg)  01/24/24 93 lb (42.2 kg)  12/09/23 93 lb 9.6 oz (42.5 kg)    Physical Exam  Lab Results  Component Value Date   WBC 6.1 04/07/2024   HGB 11.7 (L) 04/07/2024   HCT 34.4 (L) 04/07/2024   PLT 242.0 04/07/2024   GLUCOSE 92 04/07/2024   CHOL 188 04/07/2024   TRIG 105.0 04/07/2024   HDL 101.20 04/07/2024   LDLCALC 66 04/07/2024   ALT 16 12/09/2023   AST 23 12/09/2023   NA 134 (L) 04/07/2024   K 4.1 04/07/2024   CL  97 04/07/2024   CREATININE 0.69 04/07/2024   BUN 21 04/07/2024   CO2 29 04/07/2024   TSH 1.37 04/07/2024   HGBA1C 5.1 01/29/2022   MICROALBUR <0.2 01/29/2022    CT Chest Wo Contrast Result Date: 06/13/2023 CLINICAL DATA:  Follow-up pulmonary nodules. Breast cancer with bilateral mastectomy 1999. 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 EXAM: CT CHEST WITHOUT CONTRAST TECHNIQUE: Multidetector CT imaging of the chest was performed following the standard protocol without IV contrast. RADIATION DOSE REDUCTION: This exam was performed according to the departmental dose-optimization program which includes automated exposure control, adjustment of the mA and/or kV according to patient size and/or use of iterative reconstruction technique. COMPARISON:  Left chest x-ray was PA and lateral 10/05/2022, with comparison made to chest CTs without contrast dated 06/04/2022 and 11/29/2021. FINDINGS: Cardiovascular: There is spray artifact from a metallic mitral valve replacement. The heart is slightly enlarged. No pericardial effusion is seen. Pulmonary arteries and veins are normal caliber. There is no aortic aneurysm noted with aortic tortuosity, and small amount of scattered calcific plaque. The unenhanced great vessels are unremarkable. Mediastinum/Nodes: No enlarged mediastinal or axillary lymph nodes. Thyroid  gland, trachea, and esophagus demonstrate no significant findings. Small hiatal hernia. Old right axillary surgical clips in keeping with prior nodal dissection. Lungs/Pleura: The lungs are hyperinflated. There is mild biapical pleural-parenchymal scarring, scattered linear scar-like opacities again in both bases and a calcified left lower lobe granuloma. Again noted is an irregular ground-glass subsolid lesion, with anterior and medial pleural stranding, in the anterior base of the right upper lobe. There is no appreciable increased solid component or change in size. On 5:81 this again measures 2.4 x 1.8 cm,  unchanged allowing for minor technical differences. It has a small solid component as before, again measuring 6 mm in the anterior aspect of the lesion on 5:82, also unchanged. In the right middle lobe, again noted is an impacted ectatic peripheral bronchus simulating a small nodule on 5:102. The lungs are otherwise clear. No new infiltrate is seen. No pleural effusion or thickening. Upper Abdomen: Stable hepatic cysts, largest is 3 cm in segment 4. No acute upper abdominal abnormality is seen. There is abdominal aortic atherosclerosis. Musculoskeletal: Osteopenia with degenerative change in mild kypholevoscoliosis thoracic spine. Grade 1 degenerative retrolisthesis with collapsed discs T12-L1 and L1-2. IMPRESSION: 1. Stable 2.4 x 1.8 cm irregular ground-glass subsolid lesion in the anterior base of the right upper lobe, with a staple small 6 mm solid component. Adenocarcinoma cannot be excluded. Follow up by CT is recommended in 12 months, with continued annual surveillance for a minimum of 3 years. Reference: Recommendations for the Management of Subsolid Pulmonary Nodules Detected at CT: A Statement from the Fleischner Society Radiology 2013;  266:1, [id number]. 2. Hyperinflation with additional chronic change. 3. Aortic atherosclerosis. 4. Small hiatal hernia. 5. Osteopenia and degenerative change. Aortic Atherosclerosis (ICD10-I70.0). Electronically Signed   By: Francis Quam M.D.   On: 06/13/2023 23:52    Assessment & Plan:  Mixed hyperlipidemia -     Lipid panel; Future -     TSH; Future  PAF (paroxysmal atrial fibrillation) (HCC) -     Basic metabolic panel with GFR; Future -     TSH; Future  Osteoporosis without current pathological fracture, unspecified osteoporosis type -     Basic metabolic panel with GFR; Future -     Phosphorus; Future -     VITAMIN D  25 Hydroxy (Vit-D Deficiency, Fractures); Future  Deficiency anemia -     IBC + Ferritin; Future -     Vitamin B1; Future -     Zinc ;  Future -     CBC with Differential/Platelet; Future -     Reticulocytes; Future -     Vitamin B12; Future -     Folate; Future -     Methylmalonic acid, serum; Future  Encounter for general adult medical examination with abnormal findings     Follow-up: Return in about 6 months (around 10/05/2024).  Debby Molt, MD

## 2024-04-12 LAB — ZINC: Zinc: 45 ug/dL — ABNORMAL LOW (ref 60–130)

## 2024-04-12 LAB — RETICULOCYTES
ABS Retic: 51380 {cells}/uL (ref 20000–80000)
Retic Ct Pct: 1.4 %

## 2024-04-12 LAB — METHYLMALONIC ACID, SERUM: Methylmalonic Acid, Quant: 138 nmol/L (ref 85–423)

## 2024-04-12 LAB — VITAMIN B1: Vitamin B1 (Thiamine): 34 nmol/L — ABNORMAL HIGH (ref 8–30)

## 2024-04-13 ENCOUNTER — Ambulatory Visit: Payer: Self-pay | Admitting: Internal Medicine

## 2024-04-13 MED ORDER — ZINC GLUCONATE 50 MG PO TABS: 50.0000 mg | ORAL_TABLET | Freq: Every day | ORAL | 1 refills | Status: AC

## 2024-04-22 DIAGNOSIS — K080 Exfoliation of teeth due to systemic causes: Secondary | ICD-10-CM | POA: Diagnosis not present

## 2024-04-24 ENCOUNTER — Other Ambulatory Visit (HOSPITAL_BASED_OUTPATIENT_CLINIC_OR_DEPARTMENT_OTHER): Payer: Self-pay | Admitting: Family

## 2024-05-08 ENCOUNTER — Telehealth: Payer: Self-pay

## 2024-05-08 NOTE — Telephone Encounter (Signed)
 Copied from CRM #[id number]. Topic: Appointments - Scheduling Inquiry for Clinic >> May 07, 2024  3:23 PM Helen Davis wrote: Reason for CRM: pt was returning call but no notes on what the call was pertaining. But she does have a CT order that needs to be schedule before her follow up.   ATC x1. LMTCB saying pt will be contacted to schedule CT and to schedule an ov for feb. To review.  Per lov pt should return to see Dr. Shelah in Feb 2026 with an office visit to review CT. [facility] please schedule CT for January and an office visit for February to follow-up on imaging.

## 2024-05-08 NOTE — Telephone Encounter (Signed)
 LVM for patient to call and discuss scheduling

## 2024-05-12 NOTE — Telephone Encounter (Signed)
 Spoke with patient and she is asking is this testing still necessary---call back [phone]

## 2024-05-12 NOTE — Telephone Encounter (Signed)
Following up on request.

## 2024-05-13 NOTE — Telephone Encounter (Signed)
 ATC 1. LMTCB stating imaging is for yearly nodule follow up. [facility] can we please get this scheduled for January Please see my previous note Per lov pt should return to see Dr. Shelah in Feb 2026 with an office visit to review CT.

## 2024-05-13 NOTE — Telephone Encounter (Signed)
 LVM for patient to call and discuss scheduling

## 2024-05-18 NOTE — Telephone Encounter (Signed)
 Patient has been scheduled for 1/20 at GI. Patient would like to know if it's still necessary and would like to talk to Dr.Byrum or a Nurse to talk about it.

## 2024-05-18 NOTE — Telephone Encounter (Signed)
 Called and spoke with the pt and she states she would not like to continue with imaging. Pt was told by one of her friends that the older she gets the less imaging she needs to have done.  Pt would like to have Dr. Christin input on this and if she needs to be seen at the clinic anymore.  I advised pt to follow-up if she has any new pulm issues or concerns.  Dr. Shelah can you verify if okay for pt to discontinue having imaging done.

## 2024-05-19 NOTE — Telephone Encounter (Signed)
 The decision to continue to image is up to her, as long as she understands the small risk that the nodule we have been following could be a slow growing lung cancer. The Thoracic Guideline Recs are to follow a nodule like hers for 5 years. Her nodule has been stable for over 2 years. If she wants to stop following it then I will respect her wishes.

## 2024-05-19 NOTE — Telephone Encounter (Signed)
 I called and spoke with the pt and after hearing RB note explained to her she would like to continue with imaging scheduled for 1/20. I have scheduled the pt an ov for 2/5 with Dr. Shelah to review.  Pt would like to follow through with this and then decide at next ov if she would like to continue further imaging.  Nothing further needed.

## 2024-06-09 ENCOUNTER — Ambulatory Visit
Admission: RE | Admit: 2024-06-09 | Discharge: 2024-06-09 | Disposition: A | Source: Ambulatory Visit | Attending: Emergency Medicine | Admitting: Emergency Medicine

## 2024-06-09 DIAGNOSIS — R911 Solitary pulmonary nodule: Secondary | ICD-10-CM

## 2024-06-23 ENCOUNTER — Ambulatory Visit: Payer: Self-pay | Admitting: Emergency Medicine

## 2024-06-23 NOTE — Progress Notes (Signed)
 ATC x1. LMTCB to review results.

## 2024-06-24 NOTE — Telephone Encounter (Signed)
 Copied from CRM #[id number]. Topic: Clinical - Lab/Test Results >> Jun 23, 2024  5:05 PM Helen Davis wrote: Reason for CRM: Patient returning call for [facility] regarding CT scan results.  ATC x2. LMTCB regarding CT results.

## 2024-06-24 NOTE — Progress Notes (Signed)
 Called and spoke with the pt and notified of RB result note. Pt verbalized understanding and will f/u 07/29/2024. Nothing further needed.

## 2024-06-25 ENCOUNTER — Ambulatory Visit: Admitting: Emergency Medicine

## 2024-07-29 ENCOUNTER — Ambulatory Visit: Admitting: Emergency Medicine

## 2024-11-02 ENCOUNTER — Ambulatory Visit

## 2024-11-09 ENCOUNTER — Ambulatory Visit: Admitting: Oncology

## 2024-11-09 ENCOUNTER — Other Ambulatory Visit
